# Patient Record
Sex: Male | Born: 1970 | Race: White | Hispanic: No | Smoking: Former smoker
Health system: Southern US, Community
[De-identification: ages and names within clinical notes are randomized; demographics above are authoritative.]

## PROBLEM LIST (undated history)

## (undated) DIAGNOSIS — E785 Hyperlipidemia, unspecified: Secondary | ICD-10-CM

## (undated) DIAGNOSIS — Z8042 Family history of malignant neoplasm of prostate: Secondary | ICD-10-CM

## (undated) DIAGNOSIS — Z803 Family history of malignant neoplasm of breast: Secondary | ICD-10-CM

## (undated) DIAGNOSIS — I080 Rheumatic disorders of both mitral and aortic valves: Secondary | ICD-10-CM

## (undated) DIAGNOSIS — I10 Essential (primary) hypertension: Secondary | ICD-10-CM

## (undated) DIAGNOSIS — I2510 Atherosclerotic heart disease of native coronary artery without angina pectoris: Secondary | ICD-10-CM

## (undated) DIAGNOSIS — Z720 Tobacco use: Secondary | ICD-10-CM

## (undated) DIAGNOSIS — I219 Acute myocardial infarction, unspecified: Secondary | ICD-10-CM

## (undated) HISTORY — DX: Tobacco use: Z72.0

## (undated) HISTORY — DX: Acute myocardial infarction, unspecified: I21.9

## (undated) HISTORY — DX: Essential (primary) hypertension: I10

## (undated) HISTORY — DX: Atherosclerotic heart disease of native coronary artery without angina pectoris: I25.10

## (undated) HISTORY — DX: Family history of malignant neoplasm of breast: Z80.3

## (undated) HISTORY — PX: CORONARY STENT PLACEMENT: SHX1402

## (undated) HISTORY — DX: Hyperlipidemia, unspecified: E78.5

## (undated) HISTORY — DX: Family history of malignant neoplasm of prostate: Z80.42

## (undated) HISTORY — DX: Rheumatic disorders of both mitral and aortic valves: I08.0

---

## 2001-11-11 HISTORY — PX: LITHOTRIPSY: SUR834

## 2006-11-10 ENCOUNTER — Emergency Department (HOSPITAL_COMMUNITY): Admission: EM | Admit: 2006-11-10 | Discharge: 2006-11-10 | Payer: Self-pay | Admitting: Family Medicine

## 2007-08-26 ENCOUNTER — Emergency Department (HOSPITAL_COMMUNITY): Admission: EM | Admit: 2007-08-26 | Discharge: 2007-08-26 | Payer: Self-pay | Admitting: Emergency Medicine

## 2007-09-23 ENCOUNTER — Ambulatory Visit: Payer: Self-pay | Admitting: Cardiology

## 2007-11-13 ENCOUNTER — Ambulatory Visit: Payer: Self-pay

## 2007-11-13 LAB — CONVERTED CEMR LAB
ALT: 43 units/L (ref 0–53)
AST: 30 units/L (ref 0–37)
Bilirubin, Direct: 0.1 mg/dL (ref 0.0–0.3)
HDL: 33.7 mg/dL — ABNORMAL LOW (ref >39.0)
LDL Cholesterol: 98 mg/dL (ref 0–99)
Total Bilirubin: 0.7 mg/dL (ref 0.3–1.2)

## 2007-11-18 ENCOUNTER — Ambulatory Visit: Payer: Self-pay | Admitting: Cardiology

## 2008-06-08 ENCOUNTER — Ambulatory Visit: Payer: Self-pay | Admitting: Cardiology

## 2008-06-15 ENCOUNTER — Ambulatory Visit: Payer: Self-pay | Admitting: Cardiology

## 2008-06-15 LAB — CONVERTED CEMR LAB
ALT: 35 units/L (ref 0–53)
AST: 25 units/L (ref 0–37)
Albumin: 4 g/dL (ref 3.5–5.2)
Alkaline Phosphatase: 65 units/L (ref 39–117)
Cholesterol: 132 mg/dL (ref 0–200)
LDL Cholesterol: 80 mg/dL (ref 0–99)
Triglycerides: 111 mg/dL (ref 0–149)

## 2009-02-11 ENCOUNTER — Emergency Department (HOSPITAL_COMMUNITY): Admission: EM | Admit: 2009-02-11 | Discharge: 2009-02-11 | Payer: Self-pay | Admitting: Family Medicine

## 2009-03-27 ENCOUNTER — Encounter (INDEPENDENT_AMBULATORY_CARE_PROVIDER_SITE_OTHER): Payer: Self-pay | Admitting: *Deleted

## 2009-11-28 DIAGNOSIS — I080 Rheumatic disorders of both mitral and aortic valves: Secondary | ICD-10-CM | POA: Insufficient documentation

## 2009-11-28 DIAGNOSIS — I2510 Atherosclerotic heart disease of native coronary artery without angina pectoris: Secondary | ICD-10-CM | POA: Insufficient documentation

## 2009-11-28 DIAGNOSIS — I340 Nonrheumatic mitral (valve) insufficiency: Secondary | ICD-10-CM | POA: Insufficient documentation

## 2009-11-28 DIAGNOSIS — I219 Acute myocardial infarction, unspecified: Secondary | ICD-10-CM | POA: Insufficient documentation

## 2009-11-28 DIAGNOSIS — I10 Essential (primary) hypertension: Secondary | ICD-10-CM | POA: Insufficient documentation

## 2009-11-28 DIAGNOSIS — E785 Hyperlipidemia, unspecified: Secondary | ICD-10-CM | POA: Insufficient documentation

## 2009-12-04 ENCOUNTER — Ambulatory Visit: Payer: Self-pay | Admitting: Cardiology

## 2009-12-04 DIAGNOSIS — R930 Abnormal findings on diagnostic imaging of skull and head, not elsewhere classified: Secondary | ICD-10-CM | POA: Insufficient documentation

## 2009-12-04 DIAGNOSIS — F17200 Nicotine dependence, unspecified, uncomplicated: Secondary | ICD-10-CM | POA: Insufficient documentation

## 2009-12-05 ENCOUNTER — Encounter: Payer: Self-pay | Admitting: Cardiology

## 2009-12-26 ENCOUNTER — Telehealth (INDEPENDENT_AMBULATORY_CARE_PROVIDER_SITE_OTHER): Payer: Self-pay | Admitting: *Deleted

## 2009-12-26 ENCOUNTER — Ambulatory Visit: Payer: Self-pay | Admitting: Cardiology

## 2009-12-26 LAB — CONVERTED CEMR LAB
CO2: 31 meq/L (ref 19–32)
Calcium: 9.5 mg/dL (ref 8.4–10.5)
Sodium: 139 meq/L (ref 135–145)

## 2010-12-11 NOTE — Progress Notes (Signed)
  Walk in Patient Form Recieved " Pt. has questions about Medicine"forwarded to Jonelle [facility] Mesiemore  December 26, 2009 3:40 PM

## 2010-12-11 NOTE — Medication Information (Signed)
Summary: [facility] Prescription Information  [facility] Prescription Information   Imported By: Roderic Ovens 12/08/2009 11:12:34  _____________________________________________________________________  External Attachment:    Type:   Image     Comment:   External Document

## 2010-12-11 NOTE — Assessment & Plan Note (Signed)
Summary: f1y/kfw  Medications Added CRESTOR 40 MG TABS (ROSUVASTATIN CALCIUM) Take 1 tablet by mouth once a day NIASPAN 500 MG CR-TABS (NIACIN (ANTIHYPERLIPIDEMIC)) Take 1 tablet by mouth at bedtime ASPIRIN 81 MG TBEC (ASPIRIN) Take one tablet by mouth daily METOPROLOL SUCCINATE 50 MG XR24H-TAB (METOPROLOL SUCCINATE) Take one tablet by mouth daily FISH OIL   OIL (FISH OIL) 1 tab by mouth once daily LISINOPRIL 10 MG TABS (LISINOPRIL) Take one tablet by mouth daily        History of Present Illness: The patient is a pleasant gentleman who I have seen in the past for coronary artery disease.  The patient  had an inferior myocardial infarction in 1999 at age 36.  He had a stent to the right coronary artery at that time.  His most recent Myoview here was performed on November 13, 2007.  His LV function was normal with an estimated ejection fraction of 58%.  There was no ischemia or infarction.  His echocardiogram on November 13, 2007, showed normal LV function, mild prolapse of the anterior mitral valve leaflet and mild mitral regurgitation. I last saw him in July of 2009. Since then the patient denies any dyspnea on exertion, orthopnea, PND, pedal edema, palpitations, syncope or chest pain.   Current Medications (verified): 1)  Crestor 40 Mg Tabs (Rosuvastatin Calcium) .... Take 1 Tablet By Mouth Once A Day 2)  Niaspan 500 Mg Cr-Tabs (Niacin (Antihyperlipidemic)) .... Take 1 Tablet By Mouth At Bedtime 3)  Aspirin 81 Mg Tbec (Aspirin) .... Take One Tablet By Mouth Daily 4)  Metoprolol Succinate 50 Mg Xr24h-Tab (Metoprolol Succinate) .... Take One Tablet By Mouth Daily 5)  Fish Oil   Oil (Fish Oil) .Marland Kitchen.. 1 Tab By Mouth Once Daily  Past History:  Past Medical History: Current Problems:  MITRAL REGURGITATION (ICD-396.3) mitral valve prolapse HYPERLIPIDEMIA (ICD-272.4) MYOCARDIAL INFARCTION (ICD-410.90) HYPERTENSION (ICD-401.9) CAD (ICD-414.00)  Social History: Reviewed history from  11/28/2009 and no changes required.  no drug abuse, former smoker, occasional drinker   Review of Systems       no fevers or chills, productive cough, hemoptysis, dysphasia, odynophagia, melena, hematochezia, dysuria, hematuria, rash, seizure activity, orthopnea, PND, pedal edema, claudication. Remaining systems are negative.   Vital Signs:  Patient profile:   40 year old male Height:      68 inches Weight:      157 pounds BMI:     23.96 Pulse rate:   55 / minute Resp:     12 per minute BP sitting:   142 / 76  (left arm)  Vitals Entered By: Kem Parkinson (December 04, 2009 2:25 PM)  Physical Exam  General:  Well-developed well-nourished in no acute distress.  Skin is warm and dry.  HEENT is normal.  Neck is supple. No thyromegaly.  Chest is clear to auscultation with normal expansion.  Cardiovascular exam is regular rate and rhythm. 2/6 systolic murmur at the apex Abdominal exam nontender or distended. No masses palpated. Extremities show no edema. neuro grossly intact    EKG  Procedure date:  12/04/2009  Findings:      Sinus rhythm at a rate of 55. No ST changes noted.  Impression & Recommendations:  Problem # 1:  CAD (ICD-414.00)  Continue aspirin, beta blocker and statin. Repeat stress test in one year when he returns. His updated medication list for this problem includes:    Aspirin 81 Mg Tbec (Aspirin) .Marland Kitchen... Take one tablet by mouth daily    Metoprolol Succinate  50 Mg Xr24h-tab (Metoprolol succinate) .Marland Kitchen... Take one tablet by mouth daily    Lisinopril 10 Mg Tabs (Lisinopril) .Marland Kitchen... Take one tablet by mouth daily  Orders: T-2 View CXR (71020TC)  Problem # 2:  HYPERTENSION (ICD-401.9) Blood pressure elevated. Add lisinopril 10 mg p.o. daily. Check be met in one week. His updated medication list for this problem includes:    Aspirin 81 Mg Tbec (Aspirin) .Marland Kitchen... Take one tablet by mouth daily    Metoprolol Succinate 50 Mg Xr24h-tab (Metoprolol succinate) .Marland Kitchen...  Take one tablet by mouth daily    Lisinopril 10 Mg Tabs (Lisinopril) .Marland Kitchen... Take one tablet by mouth daily  Problem # 3:  HYPERLIPIDEMIA (ICD-272.4) Resume Crestor and Niaspan. Patient states he was not taking these for the past 2 months. Check lipids and liver in 6 weeks. His updated medication list for this problem includes:    Crestor 40 Mg Tabs (Rosuvastatin calcium) .Marland Kitchen... Take 1 tablet by mouth once a day    Niaspan 500 Mg Cr-tabs (Niacin (antihyperlipidemic)) .Marland Kitchen... Take 1 tablet by mouth at bedtime  Problem # 4:  MITRAL REGURGITATION (ICD-396.3) Previous echocardiogram showed mitral valve prolapse and mild mitral regurgitation. He will need followup echocardiograms in the future.  Problem # 5:  TOBACCO ABUSE (ICD-305.1) Patient counseled on discontinuing for between 3-10 minutes. Check chest x-ray.  Patient Instructions: 1)  Your physician recommends that you schedule a follow-up appointment in: ONE YEAR 2)  Your physician recommends that you return for lab work in:ONE WEEK-NONFASTING 3)  6 WEEKS FASTING 4)  Your physician has recommended you make the following change in your medication: RESTART CRESTOR 40MG  ONCE DAILY 5)  RESTART NIASPAN 500MG  ONCE DAILY 6)  START LISINOPRIL 10MG  ONCE DAILY Prescriptions: LISINOPRIL 10 MG TABS (LISINOPRIL) Take one tablet by mouth daily  #90 x 4   Entered by:   Deliah Goody, RN   Authorized by:   Ferman Hamming, MD, [facility]   Signed by:   Deliah Goody, RN on 12/04/2009   Method used:   Electronically to        [facility]* (mail-order)             ,          Ph: [phone]       Fax: [phone]   RxID:   [id number]

## 2010-12-11 NOTE — Progress Notes (Signed)
  Phone Note Outgoing Call   Call placed by: Deliah Goody, RN,  December 26, 2009 5:07 PM Summary of Call: pt called with lab, he c/o headache since starting lisinopril. he has never really had headaches before and is having them daily since starting lisinopril and they seem to be getting worse. okay given for pt to stop lisinopril and will foward for dr Jens Som review Deliah Goody, RN  December 26, 2009 5:12 PM   Follow-up for Phone Call        dc lisinopril; norvasc 5 mg by mouth daily Ferman Hamming, MD, [facility]  December 28, 2009 8:14 AM   Additional Follow-up for Phone Call Additional follow up Details #1::        PT AWARE NEW MED SENT TO [facility] Additional Follow-up by: Scherrie Bateman, LPN,  December 28, 2009 3:40 PM    New/Updated Medications: AMLODIPINE BESYLATE 5 MG TABS (AMLODIPINE BESYLATE) 1 once daily Prescriptions: AMLODIPINE BESYLATE 5 MG TABS (AMLODIPINE BESYLATE) 1 once daily  #90 x 4   Entered by:   Scherrie Bateman, LPN   Authorized by:   Ferman Hamming, MD, [facility]   Signed by:   Scherrie Bateman, LPN on 40/34/7425   Method used:   Electronically to        [facility]* (mail-order)             ,          Ph: [phone]       Fax: [phone]   RxID:   [id number]

## 2011-03-07 ENCOUNTER — Other Ambulatory Visit: Payer: Self-pay | Admitting: Cardiology

## 2011-03-26 NOTE — Assessment & Plan Note (Signed)
[facility]                            CARDIOLOGY OFFICE NOTE   Austin Vincent, Austin Vincent                  MRN:          [id number]  DATE:09/23/2007                            DOB:          1971-01-25    Austin Vincent is a 40 year old with past medical history of coronary  artery disease who presents to establish.  The patient's cardiac history  dates back to 1999 when he suffered a myocardial infarction at age 71.  This occurred in [place].  He apparently had PCI of his right  coronary artery at that time.  I do not have those records available.  Since then he has been followed in [place] and in [place].  Over  the past 6 months, he has been off of all of his medications.  He wants  to establish here at [facility].  Of note, approximately 1 month  ago he was seen at [facility] for chest pain.  However, it was  felt that he had a pneumonia.  He has not had any chest pain in the past  1 month and does not have any when he exerts himself.  There is no  dyspnea on exertion, orthopnea, PND, pedal edema, palpitations,  presyncope, or syncope.   CURRENT MEDICATIONS:  None, as he has discontinued his medications.   SOCIAL HISTORY:  He does smoke.  He occasionally consumes alcohol.  He  denies any drug use.   FAMILY HISTORY:  Positive for coronary artery disease in both his mother  and his father.   PAST MEDICAL HISTORY:  1. Hypertension.  2. Hyperlipidemia.  3. There is no diabetes mellitus.  4. He has a history of myocardial infarction, as described above.   PAST SURGICAL HISTORY:  He has had no previous surgeries.   REVIEW OF SYSTEMS:  He denies any headaches or fevers or chills.  There  is no productive cough or hemoptysis.  There is no dysphagia,  odynophagia, melena, or hematochezia.  There is no dysuria or hematuria.  There is no rash or seizure activity.  There is no orthopnea, PND, or  pedal edema.  There is no  claudication.  The remainder of the review of  systems are negative.   PHYSICAL EXAMINATION:  VITAL SIGNS:  His exam today shows a blood  pressure of 153/109, pulse 84.  He weighs 141 pounds.  GENERAL:  He is well-developed and well-nourished, in no acute distress.  SKIN:  Warm and dry.  PSYCHIATRIC:  He does not appear to be depressed.  BACK:  Normal.  HEENT:  Normal with normal eyelids.  NECK:  Supple with normal upstroke bilaterally.  No bruits.  There is no  jugular venous distension.  I cannot appreciate thyromegaly.  CHEST:  Clear to auscultation with normal expansion.  CARDIOVASCULAR:  Regular rate and rhythm with normal S1 and S2.  There  is a 2/6 systolic murmur at the apex that radiates to the left axilla.  ABDOMEN:  Nontender, nondistended.  Positive bowel sounds.  No  hepatosplenomegaly.  No masses appreciated.  There is no abdominal  bruit.  EXTREMITIES:  There is no peripheral clubbing.  He has 2+ femoral pulses  bilateral and no bruits.  No edema.  I palpate no cords.  He has 2+  posterior tibial pulses bilaterally.  NEUROLOGIC:  Grossly intact.   His electrocardiogram shows a sinus rhythm at a rate of 80.  A prior  inferior infarct cannot be excluded.   DIAGNOSES:  1. Coronary artery disease.  Austin Vincent has a history of coronary      disease.  He also had chest pain approximately 1 month ago that      apparently was felt to be related to pneumonia.  However, we will      risk stratify with a stress Myoview.  Note, he also has a history      of ischemic mitral regurgitation, and there was a murmur on      examination.  I will schedule him to have an echocardiogram to      evaluate his mitral regurgitation.  He is off of all of his      medications, and we will resume aspirin, Toprol-XL at 50 mg orally      daily, and Crestor 40 mg orally daily (he was on this dose      previously).  We discussed the importance of discontinuing his      tobacco use as well as  risk factor modification, including diet and      exercise.  2. Chest pain.  As per #1, we will schedule a Myoview.  3. Hypertension.  His blood pressure is elevated today, and we will      begin Toprol-XL.  If it is not controlled with Toprol-XL alone, we      will add angiotensin-converting enzyme inhibitor.  4. Hyperlipidemia.  We will resume his Crestor at 40 mg orally daily,      and will check lipids and liver in 6 weeks and adjust as indicated.  5. Tobacco use.  We discussed the importance of discontinuing this.   I will see him back in 6-8 weeks to check his blood pressure and adjust  his regimen as indicated.     Madolyn Frieze Austin Som, MD, [facility]  Electronically Signed    BSC/MedQ  DD: 09/23/2007  DT: 09/24/2007  Job #: [id number]

## 2011-03-26 NOTE — Assessment & Plan Note (Signed)
[facility]                            CARDIOLOGY OFFICE NOTE   Austin Vincent, Austin Vincent                  MRN:          [id number]  DATE:06/08/2008                            DOB:          [birth year]    HISTORY OF PRESENT ILLNESS:  The patient is a pleasant 40 year old  gentleman who I have seen in the past for coronary artery disease.  The  patient apparently had an inferior myocardial infarction in 1999 at age  16.  He had a stent to the right coronary artery at that time.  His most  recent Myoview here was performed on November 13, 2007.  His LV function  was normal with an estimated ejection fraction of 58%.  There was no  ischemia or infarction.  His echocardiogram on November 13, 2007, showed  normal LV function, mild prolapse of the anterior mitral valve leaflet  and mild mitral regurgitation.  Since I last saw him, he denies any  dyspnea, chest pain, palpitations, or syncope.  There is no pedal edema.  He states he occasionally smokes a cigarette.  He is not exercising  routinely.  He is trying to follow a diet.   MEDICATIONS:  1. Toprol 25 mg p.o. daily.  2. Crestor 40 mg p.o. daily.  3. Aspirin 81 mg p.o. daily.  4. Niaspan 500 mg p.o. daily   PHYSICAL EXAMINATION:  VITAL SIGNS:  Blood pressure of 153/99, his pulse  is 68, he weighs 172 pounds.  HEENT:  Normal.  NECK:  Supple.  CHEST:  Clear.  CARDIOVASCULAR:  Regular rate and rhythm.  ABDOMEN:  No tenderness.  EXTREMITIES:  No edema.   His electrocardiogram shows a sinus rhythm at a rate of 77.  The axis is  normal.  A prior inferior infarct cannot be excluded.   DIAGNOSES:  1. Coronary artery disease status post percutaneous coronary      intervention of the right coronary artery - Austin Vincent is      doing well from symptomatic standpoint and his Myoview performed in      January of this year showed no ischemia.  We will continue his      medical therapy including his aspirin,  statin, and beta blocker.  2. Hypertension - his blood pressure is elevated.  I will increase his      Toprol to 50 mg p.o. daily.  3. Hyperlipidemia - he will continue on his Crestor and his Niaspan      and we will check lipids and liver and adjust as indicated.  4. Tobacco abuse - we discussed importance of discontinuing this      between 3-10 minutes.  5. History of mitral regurgitation (mild).   We discussed the importance of diet and exercise.  He will see [place] back  in 12 months.     Austin Vincent Austin Som, MD, [facility]  Electronically Signed    BSC/MedQ  DD: 06/08/2008  DT: 06/09/2008  Job #: [id number]

## 2011-03-26 NOTE — Assessment & Plan Note (Signed)
[facility]                            CARDIOLOGY OFFICE NOTE   Austin Vincent, Austin Vincent                  MRN:          [id number]  DATE:11/18/2007                            DOB:          December 01, 1970    HISTORY:  Austin Vincent is a very pleasant 40 year old gentleman who  has a history of coronary disease I initially met on September 23, 2007.  At that time, he was off all of his medications.  We resumed his Toprol,  aspirin and Crestor.  We did schedule him to have an echocardiogram on  November 13, 2007.  His LV function was normal.  There was mild prolapse  of the anterior mitral valve leaflet with mild mitral regurgitation.  He  also had a Myoview on November 13, 2007.  The ejection fraction 68% and  there was no ischemia or infarction.  Since I saw him, he denies any  dyspnea on exertion, orthopnea, PND, pedal edema, presyncope, syncope,  exertional chest pain.   MEDICATIONS:  1. Toprol 25 mg p.o. daily.  2. Crestor 40 mg p.o. daily.  3. Aspirin 81 mg p.o. daily.   PHYSICAL EXAMINATION:  VITAL SIGNS:  Blood pressure 110/80, pulse of 68,  he weighs 174 pounds.  HEENT:  Normal.  NECK:  Supple.  CHEST:  Clear.  CARDIOVASCULAR:  Regular rate.  ABDOMEN:  No tenderness.  EXTREMITIES:  Show no edema.   DIAGNOSES:  1. Coronary artery disease.  Austin Vincent has not complained of any      chest pain or shortness of breath since I last saw him.  His      Myoview showed no ischemia or infarction.  We will continue medical      therapy including his aspirin, statin and beta blocker.  2. History of mitral regurgitation.  His follow up echocardiogram      showed mild mitral valve prolapse and mild mitral regurgitation.      We will follow this in the future.  3. Hypertension.  Blood pressure is adequately controlled on his      Toprol.  4. Hyperlipidemia.  We did resume his Crestor at 40 mg p.o. daily.      His LDL now is 98 with HDL 33.  We will  add Niaspan 500 mg p.o.      daily x 4 weeks and then increase to 1 gram p.o. daily.  We will      then check CK as well as liver functions and lipids 4 weeks later.  5. Tobacco abuse.  He is now discontinued this for the past 4 weeks.   PLAN:  1. We discussed the importance of diet and exercise.  2. We will see him back in approximately 6 months.     Madolyn Frieze Jens Som, MD, [facility]  Electronically Signed    BSC/MedQ  DD: 11/18/2007  DT: 11/18/2007  Job #: [id number]

## 2011-08-21 LAB — DIFFERENTIAL
Eosinophils Absolute: 0
Lymphs Abs: 0.9
Monocytes Relative: 11
Neutro Abs: 2.1
Neutrophils Relative %: 63

## 2011-08-21 LAB — CBC
Hemoglobin: 14.7
MCHC: 35.5
Platelets: 161
RDW: 12.6

## 2011-08-21 LAB — COMPREHENSIVE METABOLIC PANEL
ALT: 44
Calcium: 9.3
Glucose, Bld: 100 — ABNORMAL HIGH
Sodium: 136
Total Protein: 7.1

## 2011-08-21 LAB — POCT CARDIAC MARKERS
CKMB, poc: <1 — ABNORMAL LOW
Myoglobin, poc: 71
Operator id: 146091
Operator id: 279831
Troponin i, poc: <0.05

## 2011-08-21 LAB — URINALYSIS, ROUTINE W REFLEX MICROSCOPIC
Glucose, UA: NEGATIVE
Specific Gravity, Urine: 1.014
pH: 6.5

## 2011-08-21 LAB — CULTURE, BLOOD (ROUTINE X 2): Culture: NO GROWTH

## 2011-09-20 ENCOUNTER — Encounter: Payer: Self-pay | Admitting: *Deleted

## 2011-09-23 ENCOUNTER — Encounter: Payer: Self-pay | Admitting: Cardiology

## 2011-09-23 ENCOUNTER — Ambulatory Visit (INDEPENDENT_AMBULATORY_CARE_PROVIDER_SITE_OTHER): Payer: 59 | Admitting: Cardiology

## 2011-09-23 DIAGNOSIS — I340 Nonrheumatic mitral (valve) insufficiency: Secondary | ICD-10-CM

## 2011-09-23 DIAGNOSIS — I059 Rheumatic mitral valve disease, unspecified: Secondary | ICD-10-CM

## 2011-09-23 DIAGNOSIS — I341 Nonrheumatic mitral (valve) prolapse: Secondary | ICD-10-CM

## 2011-09-23 DIAGNOSIS — I2510 Atherosclerotic heart disease of native coronary artery without angina pectoris: Secondary | ICD-10-CM

## 2011-09-23 DIAGNOSIS — F17200 Nicotine dependence, unspecified, uncomplicated: Secondary | ICD-10-CM

## 2011-09-23 NOTE — Progress Notes (Signed)
HPI:The patient is a pleasant gentleman who I have seen in the past for coronary artery disease.  The patient  had an inferior myocardial infarction in 1999 at age 40.  He had a stent to the right coronary artery at that time.  His most recent Myoview here was performed on November 13, 2007.  His LV function was normal with an estimated ejection fraction of 58%.  There was no ischemia or infarction.  His echocardiogram on November 13, 2007, showed normal LV function, mild prolapse of the anterior mitral valve leaflet and mild mitral regurgitation. I last saw him in Jan 2011. Since then the patient denies any dyspnea on exertion, orthopnea, PND, pedal edema, palpitations, syncope or chest pain.   Current Outpatient Prescriptions  Medication Sig Dispense Refill  . amLODipine (NORVASC) 5 MG tablet TAKE 1 TABLET ONCE DAILY  90 tablet  3  . aspirin 81 MG tablet Take 81 mg by mouth daily.        . CRESTOR 40 MG tablet TAKE 1 TABLET DAILY  90 tablet  3  . niacin (NIASPAN) 500 MG CR tablet Take 500 mg by mouth at bedtime.           Past Medical History  Diagnosis Date  . MYOCARDIAL INFARCTION   . MITRAL REGURGITATION   . HYPERTENSION   . HYPERLIPIDEMIA   . CAD     Past Surgical History  Procedure Date  . Coronary stent placement     History   Social History  . Marital Status: Divorced    Spouse Name: N/A    Number of Children: N/A  . Years of Education: N/A   Occupational History  . Not on file.   Social History Main Topics  . Smoking status: Current Everyday Smoker  . Smokeless tobacco: Not on file  . Alcohol Use: Not on file  . Drug Use: Not on file  . Sexually Active: Not on file   Other Topics Concern  . Not on file   Social History Narrative  . No narrative on file    ROS: Complains of ED and back pain but no fevers or chills, productive cough, hemoptysis, dysphasia, odynophagia, melena, hematochezia, dysuria, hematuria, rash, seizure activity, orthopnea, PND, pedal edema,  claudication. Remaining systems are negative.  Physical Exam: Well-developed well-nourished in no acute distress.  Skin is warm and dry.  HEENT is normal.  Neck is supple. No thyromegaly.  Chest is clear to auscultation with normal expansion.  Cardiovascular exam is regular rate and rhythm. 2/6 systolic murmur apex Abdominal exam nontender or distended. No masses palpated. Extremities show no edema. neuro grossly intact  ECG NSR with nonspecific ST changes.

## 2011-09-23 NOTE — Assessment & Plan Note (Signed)
Continue statin. Check lipids and liver. 

## 2011-09-23 NOTE — Assessment & Plan Note (Signed)
Blood pressure controlled. Continue present medications. 

## 2011-09-23 NOTE — Assessment & Plan Note (Signed)
Repeat echocardiogram for mitral regurgitation. 

## 2011-09-23 NOTE — Assessment & Plan Note (Signed)
Continue present medications. Schedule stress echocardiogram.

## 2011-09-23 NOTE — Patient Instructions (Signed)
Your physician wants you to follow-up in: ONE YEAR You will receive a reminder letter in the mail two months in advance. If you don't receive a letter, please call our office to schedule the follow-up appointment.   Your physician has requested that you have a stress echocardiogram. For further information please visit [email or web address] Please follow instruction sheet as given.   Your physician has requested that you have an echocardiogram. Echocardiography is a painless test that uses sound waves to create images of your heart. It provides your doctor with information about the size and shape of your heart and how well your heart's chambers and valves are working. This procedure takes approximately one hour. There are no restrictions for this procedure.   Your physician recommends that you return for a FASTING lipid profile: WITH STRESS TEST

## 2011-09-23 NOTE — Assessment & Plan Note (Signed)
Patient counseled on discontinuing. 

## 2011-11-08 ENCOUNTER — Ambulatory Visit (HOSPITAL_COMMUNITY): Payer: 59 | Attending: Internal Medicine | Admitting: Radiology

## 2011-11-08 ENCOUNTER — Other Ambulatory Visit (HOSPITAL_COMMUNITY): Payer: 59 | Admitting: Radiology

## 2011-11-08 ENCOUNTER — Ambulatory Visit (HOSPITAL_BASED_OUTPATIENT_CLINIC_OR_DEPARTMENT_OTHER): Payer: 59 | Admitting: Radiology

## 2011-11-08 ENCOUNTER — Other Ambulatory Visit (INDEPENDENT_AMBULATORY_CARE_PROVIDER_SITE_OTHER): Payer: 59 | Admitting: *Deleted

## 2011-11-08 DIAGNOSIS — I059 Rheumatic mitral valve disease, unspecified: Secondary | ICD-10-CM

## 2011-11-08 DIAGNOSIS — R0989 Other specified symptoms and signs involving the circulatory and respiratory systems: Secondary | ICD-10-CM

## 2011-11-08 DIAGNOSIS — E785 Hyperlipidemia, unspecified: Secondary | ICD-10-CM | POA: Insufficient documentation

## 2011-11-08 DIAGNOSIS — I252 Old myocardial infarction: Secondary | ICD-10-CM | POA: Insufficient documentation

## 2011-11-08 DIAGNOSIS — I2510 Atherosclerotic heart disease of native coronary artery without angina pectoris: Secondary | ICD-10-CM | POA: Insufficient documentation

## 2011-11-08 DIAGNOSIS — I10 Essential (primary) hypertension: Secondary | ICD-10-CM | POA: Insufficient documentation

## 2011-11-08 DIAGNOSIS — I341 Nonrheumatic mitral (valve) prolapse: Secondary | ICD-10-CM

## 2011-11-08 DIAGNOSIS — I340 Nonrheumatic mitral (valve) insufficiency: Secondary | ICD-10-CM

## 2011-11-08 DIAGNOSIS — F17200 Nicotine dependence, unspecified, uncomplicated: Secondary | ICD-10-CM | POA: Insufficient documentation

## 2011-11-08 LAB — LIPID PANEL
Cholesterol: 182 mg/dL (ref 0–200)
HDL: 46.4 mg/dL (ref >39.00)
VLDL: 20.8 mg/dL (ref 0.0–40.0)

## 2011-11-08 LAB — HEPATIC FUNCTION PANEL: Albumin: 4.2 g/dL (ref 3.5–5.2)

## 2011-11-11 ENCOUNTER — Telehealth: Payer: Self-pay | Admitting: Cardiology

## 2011-11-11 NOTE — Telephone Encounter (Signed)
Fu call °Pt returning your call  °

## 2011-11-11 NOTE — Telephone Encounter (Signed)
Spoke with pt, aware of labs, echo and stress echo results

## 2012-06-27 ENCOUNTER — Other Ambulatory Visit: Payer: Self-pay | Admitting: Cardiology

## 2013-02-10 ENCOUNTER — Telehealth: Payer: Self-pay | Admitting: Cardiology

## 2013-02-10 NOTE — Telephone Encounter (Signed)
New Prob   Pt requesting blood work (cholestorol and muscle enzymes). Would like to speak to nurse.

## 2013-02-10 NOTE — Telephone Encounter (Signed)
Spoke with pt, he has been having trouble with muscle aches and inflammation. He would like to have labs checked at appt tomorrow. Pt will come fasting to the appt.

## 2013-02-11 ENCOUNTER — Other Ambulatory Visit: Payer: Self-pay | Admitting: *Deleted

## 2013-02-11 ENCOUNTER — Encounter: Payer: Self-pay | Admitting: Cardiology

## 2013-02-11 ENCOUNTER — Ambulatory Visit (INDEPENDENT_AMBULATORY_CARE_PROVIDER_SITE_OTHER): Payer: 59 | Admitting: Cardiology

## 2013-02-11 VITALS — BP 140/90 | HR 76 | Ht 68.0 in | Wt 148.1 lb

## 2013-02-11 DIAGNOSIS — F17200 Nicotine dependence, unspecified, uncomplicated: Secondary | ICD-10-CM

## 2013-02-11 DIAGNOSIS — I2510 Atherosclerotic heart disease of native coronary artery without angina pectoris: Secondary | ICD-10-CM

## 2013-02-11 DIAGNOSIS — E7800 Pure hypercholesterolemia, unspecified: Secondary | ICD-10-CM

## 2013-02-11 LAB — HEPATIC FUNCTION PANEL
Bilirubin, Direct: 0.1 mg/dL (ref 0.0–0.3)
Total Bilirubin: 0.5 mg/dL (ref 0.3–1.2)
Total Protein: 7.5 g/dL (ref 6.0–8.3)

## 2013-02-11 LAB — LIPID PANEL
Cholesterol: 178 mg/dL (ref 0–200)
HDL: 35 mg/dL — ABNORMAL LOW (ref >39.00)
LDL Cholesterol: 113 mg/dL — ABNORMAL HIGH (ref 0–99)
VLDL: 30.4 mg/dL (ref 0.0–40.0)

## 2013-02-11 NOTE — Assessment & Plan Note (Signed)
Patient counseled on discontinuing. 

## 2013-02-11 NOTE — Assessment & Plan Note (Signed)
Patient has had some back discomfort that he feels is related to Crestor. We will discontinue that medication. Check CK. If in 6 weeks his symptoms have improved we will consider trying a different statin if he is agreeable. We will check lipids and liver at that time as well.

## 2013-02-11 NOTE — Assessment & Plan Note (Signed)
Continue aspirin 

## 2013-02-11 NOTE — Progress Notes (Signed)
   HPI: The patient is a pleasant gentleman who I have seen in the past for coronary artery disease. The patient had an inferior myocardial infarction in 1999 at age 42. He had a stent to the right coronary artery at that time. Last stress echocardiogram performed in December of 2012 was normal. Echocardiogram in December of 2012 showed normal LV function. There was mild prolapse of the anterior mitral valve leaflet and mild mitral regurgitation. I last saw him in Nov 2012. Since then the patient denies any dyspnea on exertion, orthopnea, PND, pedal edema, palpitations, syncope or chest pain. He has had some significant back discomfort that he attributes to Crestor. He missed his Crestor for 2 weeks and his symptoms improved.   Current Outpatient Prescriptions  Medication Sig Dispense Refill  . amLODipine (NORVASC) 5 MG tablet TAKE 1 TABLET ONCE DAILY  90 tablet  2  . aspirin 81 MG tablet Take 81 mg by mouth daily.        . Coenzyme Q10 (CO Q-10 PO) Take by mouth daily.      . CRESTOR 40 MG tablet TAKE 1 TABLET DAILY  90 tablet  2  . niacin (NIASPAN) 500 MG CR tablet Take 500 mg by mouth at bedtime.         No current facility-administered medications for this visit.     Past Medical History  Diagnosis Date  . MYOCARDIAL INFARCTION   . MITRAL REGURGITATION   . HYPERTENSION   . HYPERLIPIDEMIA   . CAD     Past Surgical History  Procedure Laterality Date  . Coronary stent placement      History   Social History  . Marital Status: Divorced    Spouse Name: N/A    Number of Children: N/A  . Years of Education: N/A   Occupational History  . Not on file.   Social History Main Topics  . Smoking status: Current Every Day Smoker  . Smokeless tobacco: Not on file  . Alcohol Use: Not on file  . Drug Use: Not on file  . Sexually Active: Not on file   Other Topics Concern  . Not on file   Social History Narrative  . No narrative on file    ROS: back pain but no fevers or  chills, productive cough, hemoptysis, dysphasia, odynophagia, melena, hematochezia, dysuria, hematuria, rash, seizure activity, orthopnea, PND, pedal edema, claudication. Remaining systems are negative.  Physical Exam: Well-developed well-nourished in no acute distress.  Skin is warm and dry.  HEENT is normal.  Neck is supple.  Chest is clear to auscultation with normal expansion.  Cardiovascular exam is regular rate and rhythm. 2/6 systolic murmur apex. Abdominal exam nontender or distended. No masses palpated. Extremities show no edema. neuro grossly intact  ECG sinus rhythm at a rate of 76. Left ventricular hypertrophy. No ST changes.

## 2013-02-11 NOTE — Assessment & Plan Note (Signed)
Mitral regurgitation is mild on most recent echo. 

## 2013-02-11 NOTE — Patient Instructions (Addendum)
Your physician wants you to follow-up in: ONE YEAR WITH DR Shelda Pal will receive a reminder letter in the mail two months in advance. If you don't receive a letter, please call our office to schedule the follow-up appointment.   STOP CRESTOR  Your physician recommends that you HAVE LAB WORK TODAY AND IN 6 WEEKS=DO NOT EAT PRIOR TO LAB WORK IN 6 WEEKS=WEEK OF MAY 12TH

## 2013-02-11 NOTE — Assessment & Plan Note (Signed)
Continue present medications. 

## 2013-03-24 ENCOUNTER — Other Ambulatory Visit: Payer: Self-pay | Admitting: *Deleted

## 2013-03-24 ENCOUNTER — Other Ambulatory Visit (INDEPENDENT_AMBULATORY_CARE_PROVIDER_SITE_OTHER): Payer: 59

## 2013-03-24 DIAGNOSIS — E7800 Pure hypercholesterolemia, unspecified: Secondary | ICD-10-CM

## 2013-03-24 LAB — HEPATIC FUNCTION PANEL
ALT: 24 U/L (ref 0–53)
Albumin: 3.9 g/dL (ref 3.5–5.2)
Total Bilirubin: 0.5 mg/dL (ref 0.3–1.2)
Total Protein: 6.7 g/dL (ref 6.0–8.3)

## 2013-03-24 LAB — LIPID PANEL
Cholesterol: 241 mg/dL — ABNORMAL HIGH (ref 0–200)
HDL: 35.1 mg/dL — ABNORMAL LOW (ref >39.00)
Triglycerides: 119 mg/dL (ref 0.0–149.0)

## 2013-03-24 LAB — LDL CHOLESTEROL, DIRECT: Direct LDL: 190.7 mg/dL

## 2013-03-24 MED ORDER — ATORVASTATIN CALCIUM 40 MG PO TABS: 40.0000 mg | ORAL_TABLET | Freq: Every day | ORAL | Status: DC

## 2013-09-15 ENCOUNTER — Other Ambulatory Visit: Payer: Self-pay

## 2013-09-15 MED ORDER — AMLODIPINE BESYLATE 5 MG PO TABS: ORAL_TABLET | ORAL | Status: DC

## 2013-12-01 ENCOUNTER — Encounter (HOSPITAL_COMMUNITY): Payer: Self-pay | Admitting: Emergency Medicine

## 2013-12-01 ENCOUNTER — Inpatient Hospital Stay (HOSPITAL_COMMUNITY)
Admission: EM | Admit: 2013-12-01 | Discharge: 2013-12-03 | DRG: 247 | Disposition: A | Discharge status: Home / Self Care | Payer: 59 | Attending: Cardiology | Admitting: Cardiology

## 2013-12-01 ENCOUNTER — Encounter (HOSPITAL_COMMUNITY)
Admission: EM | Disposition: A | Discharge status: Home / Self Care | Payer: 59 | Source: Home / Self Care | Attending: Cardiology

## 2013-12-01 ENCOUNTER — Ambulatory Visit (HOSPITAL_COMMUNITY): Admit: 2013-12-01 | Payer: 59 | Admitting: Cardiology

## 2013-12-01 DIAGNOSIS — Z8249 Family history of ischemic heart disease and other diseases of the circulatory system: Secondary | ICD-10-CM

## 2013-12-01 DIAGNOSIS — I214 Non-ST elevation (NSTEMI) myocardial infarction: Principal | ICD-10-CM

## 2013-12-01 DIAGNOSIS — I2582 Chronic total occlusion of coronary artery: Secondary | ICD-10-CM | POA: Diagnosis present

## 2013-12-01 DIAGNOSIS — I059 Rheumatic mitral valve disease, unspecified: Secondary | ICD-10-CM | POA: Diagnosis present

## 2013-12-01 DIAGNOSIS — I2510 Atherosclerotic heart disease of native coronary artery without angina pectoris: Secondary | ICD-10-CM

## 2013-12-01 DIAGNOSIS — I10 Essential (primary) hypertension: Secondary | ICD-10-CM

## 2013-12-01 DIAGNOSIS — I252 Old myocardial infarction: Secondary | ICD-10-CM | POA: Diagnosis present

## 2013-12-01 DIAGNOSIS — Z79899 Other long term (current) drug therapy: Secondary | ICD-10-CM

## 2013-12-01 DIAGNOSIS — Z9861 Coronary angioplasty status: Secondary | ICD-10-CM

## 2013-12-01 DIAGNOSIS — I959 Hypotension, unspecified: Secondary | ICD-10-CM | POA: Diagnosis present

## 2013-12-01 DIAGNOSIS — Z7982 Long term (current) use of aspirin: Secondary | ICD-10-CM

## 2013-12-01 DIAGNOSIS — E785 Hyperlipidemia, unspecified: Secondary | ICD-10-CM

## 2013-12-01 DIAGNOSIS — I2119 ST elevation (STEMI) myocardial infarction involving other coronary artery of inferior wall: Secondary | ICD-10-CM

## 2013-12-01 DIAGNOSIS — I219 Acute myocardial infarction, unspecified: Secondary | ICD-10-CM

## 2013-12-01 DIAGNOSIS — F17200 Nicotine dependence, unspecified, uncomplicated: Secondary | ICD-10-CM

## 2013-12-01 HISTORY — PX: LEFT HEART CATHETERIZATION WITH CORONARY ANGIOGRAM: SHX5451

## 2013-12-01 LAB — TROPONIN I
Troponin I: >20 ng/mL (ref <0.30)
Troponin I: >20 ng/mL (ref <0.30)
Troponin I: >20 ng/mL (ref <0.30)

## 2013-12-01 LAB — CBC
HCT: 39.2 % (ref 39.0–52.0)
HEMATOCRIT: 36.6 % — AB (ref 39.0–52.0)
HEMOGLOBIN: 14 g/dL (ref 13.0–17.0)
Hemoglobin: 12.9 g/dL — ABNORMAL LOW (ref 13.0–17.0)
MCH: 30.8 pg (ref 26.0–34.0)
MCH: 31.3 pg (ref 26.0–34.0)
MCHC: 35.7 g/dL (ref 30.0–36.0)
MCHC: 35.2 g/dL (ref 30.0–36.0)
MCV: 86.2 fL (ref 78.0–100.0)
MCV: 88.8 fL (ref 78.0–100.0)
Platelets: 223 10*3/uL (ref 150–400)
Platelets: 193 10*3/uL (ref 150–400)
RBC: 4.55 MIL/uL (ref 4.22–5.81)
RBC: 4.12 MIL/uL — AB (ref 4.22–5.81)
RDW: 12.2 % (ref 11.5–15.5)
RDW: 12.5 % (ref 11.5–15.5)
WBC: 18.9 10*3/uL — ABNORMAL HIGH (ref 4.0–10.5)
WBC: 12.4 10*3/uL — AB (ref 4.0–10.5)

## 2013-12-01 LAB — BASIC METABOLIC PANEL
BUN: 15 mg/dL (ref 6–23)
BUN: 9 mg/dL (ref 6–23)
CALCIUM: 8.3 mg/dL — AB (ref 8.4–10.5)
CHLORIDE: 104 meq/L (ref 96–112)
CO2: 25 mEq/L (ref 19–32)
CO2: 25 meq/L (ref 19–32)
Calcium: 9.3 mg/dL (ref 8.4–10.5)
Chloride: 100 mEq/L (ref 96–112)
Creatinine, Ser: 0.85 mg/dL (ref 0.50–1.35)
Creatinine, Ser: 0.68 mg/dL (ref 0.50–1.35)
GFR calc Af Amer: >90 mL/min (ref >90)
GFR calc Af Amer: >90 mL/min (ref >90)
GFR calc non Af Amer: >90 mL/min (ref >90)
GFR calc non Af Amer: >90 mL/min (ref >90)
GLUCOSE: 122 mg/dL — AB (ref 70–99)
Glucose, Bld: 117 mg/dL — ABNORMAL HIGH (ref 70–99)
POTASSIUM: 4.3 meq/L (ref 3.7–5.3)
Potassium: 4.4 mEq/L (ref 3.7–5.3)
Sodium: 140 mEq/L (ref 137–147)
Sodium: 141 mEq/L (ref 137–147)

## 2013-12-01 LAB — POCT I-STAT TROPONIN I: Troponin i, poc: 0.21 ng/mL (ref 0.00–0.08)

## 2013-12-01 LAB — POCT ACTIVATED CLOTTING TIME: ACTIVATED CLOTTING TIME: 348 s

## 2013-12-01 LAB — MRSA PCR SCREENING: MRSA by PCR: NEGATIVE

## 2013-12-01 SURGERY — LEFT HEART CATHETERIZATION WITH CORONARY ANGIOGRAM
Anesthesia: LOCAL

## 2013-12-01 MED ORDER — SODIUM CHLORIDE 0.9 % IV SOLN: 0.2500 mg/kg/h | INTRAVENOUS | Status: AC

## 2013-12-01 MED ORDER — ONDANSETRON HCL 4 MG/2ML IJ SOLN
INTRAMUSCULAR | Status: AC
  Filled 2013-12-01: qty 2

## 2013-12-01 MED ORDER — ASPIRIN 81 MG PO CHEW
81.0000 mg | CHEWABLE_TABLET | Freq: Every day | ORAL | Status: DC
  Administered 2013-12-01 – 2013-12-03 (×3): 81 mg via ORAL
  Filled 2013-12-01 (×3): qty 1

## 2013-12-01 MED ORDER — BIVALIRUDIN 250 MG IV SOLR
INTRAVENOUS | Status: AC
  Filled 2013-12-01: qty 250

## 2013-12-01 MED ORDER — SODIUM CHLORIDE 0.9 % IJ SOLN
3.0000 mL | Freq: Two times a day (BID) | INTRAMUSCULAR | Status: DC
  Administered 2013-12-01 (×2): 3 mL via INTRAVENOUS
  Administered 2013-12-02: 22:00:00 via INTRAVENOUS

## 2013-12-01 MED ORDER — HEPARIN (PORCINE) IN NACL 100-0.45 UNIT/ML-% IJ SOLN
1000.0000 [IU]/h | INTRAMUSCULAR | Status: DC
  Administered 2013-12-01: 1000 [IU]/h via INTRAVENOUS
  Filled 2013-12-01: qty 250

## 2013-12-01 MED ORDER — TIROFIBAN HCL IV 5 MG/100ML
0.1500 ug/kg/min | INTRAVENOUS | Status: AC
  Administered 2013-12-01 (×3): 0.15 ug/kg/min via INTRAVENOUS
  Filled 2013-12-01 (×5): qty 100

## 2013-12-01 MED ORDER — SODIUM CHLORIDE 0.9 % IV SOLN
INTRAVENOUS | Status: AC
  Administered 2013-12-01 (×2): via INTRAVENOUS

## 2013-12-01 MED ORDER — ATORVASTATIN CALCIUM 80 MG PO TABS
80.0000 mg | ORAL_TABLET | Freq: Every day | ORAL | Status: DC
  Administered 2013-12-01 – 2013-12-02 (×2): 80 mg via ORAL
  Filled 2013-12-01 (×3): qty 1

## 2013-12-01 MED ORDER — SODIUM CHLORIDE 0.9 % IV BOLUS (SEPSIS)
1000.0000 mL | Freq: Once | INTRAVENOUS | Status: AC
  Administered 2013-12-01: 1000 mL via INTRAVENOUS

## 2013-12-01 MED ORDER — TICAGRELOR 90 MG PO TABS
ORAL_TABLET | ORAL | Status: AC
  Filled 2013-12-01: qty 2

## 2013-12-01 MED ORDER — LIDOCAINE HCL (PF) 1 % IJ SOLN
INTRAMUSCULAR | Status: AC
  Filled 2013-12-01: qty 30

## 2013-12-01 MED ORDER — VERAPAMIL HCL 2.5 MG/ML IV SOLN
INTRAVENOUS | Status: AC
  Filled 2013-12-01: qty 2

## 2013-12-01 MED ORDER — LOSARTAN POTASSIUM 25 MG PO TABS
25.0000 mg | ORAL_TABLET | Freq: Every day | ORAL | Status: DC
  Filled 2013-12-01: qty 1

## 2013-12-01 MED ORDER — MIDAZOLAM HCL 2 MG/2ML IJ SOLN
INTRAMUSCULAR | Status: AC
  Filled 2013-12-01: qty 2

## 2013-12-01 MED ORDER — CARVEDILOL 3.125 MG PO TABS
3.1250 mg | ORAL_TABLET | Freq: Two times a day (BID) | ORAL | Status: DC
  Administered 2013-12-01: 3.125 mg via ORAL
  Filled 2013-12-01 (×4): qty 1

## 2013-12-01 MED ORDER — PNEUMOCOCCAL VAC POLYVALENT 25 MCG/0.5ML IJ INJ
0.5000 mL | INJECTION | INTRAMUSCULAR | Status: AC
  Administered 2013-12-02: 0.5 mL via INTRAMUSCULAR
  Filled 2013-12-01: qty 0.5

## 2013-12-01 MED ORDER — LISINOPRIL 5 MG PO TABS
5.0000 mg | ORAL_TABLET | Freq: Every day | ORAL | Status: DC
  Administered 2013-12-01: 5 mg via ORAL
  Filled 2013-12-01: qty 1

## 2013-12-01 MED ORDER — DOPAMINE-DEXTROSE 3.2-5 MG/ML-% IV SOLN
INTRAVENOUS | Status: AC
  Filled 2013-12-01: qty 250

## 2013-12-01 MED ORDER — SODIUM CHLORIDE 0.9 % IJ SOLN: 3.0000 mL | INTRAMUSCULAR | Status: DC | PRN

## 2013-12-01 MED ORDER — NITROGLYCERIN 0.2 MG/ML ON CALL CATH LAB
INTRAVENOUS | Status: AC
  Filled 2013-12-01: qty 1

## 2013-12-01 MED ORDER — PERFLUTREN LIPID MICROSPHERE
INTRAVENOUS | Status: AC
  Administered 2013-12-01: 1 mL
  Filled 2013-12-01: qty 10

## 2013-12-01 MED ORDER — SODIUM CHLORIDE 0.9 % IV SOLN: INTRAVENOUS | Status: DC

## 2013-12-01 MED ORDER — HEPARIN (PORCINE) IN NACL 2-0.9 UNIT/ML-% IJ SOLN
INTRAMUSCULAR | Status: AC
  Filled 2013-12-01: qty 1500

## 2013-12-01 MED ORDER — TICAGRELOR 90 MG PO TABS
90.0000 mg | ORAL_TABLET | Freq: Two times a day (BID) | ORAL | Status: DC
  Administered 2013-12-01 – 2013-12-03 (×4): 90 mg via ORAL
  Filled 2013-12-01 (×8): qty 1

## 2013-12-01 MED ORDER — ACETAMINOPHEN 325 MG PO TABS
650.0000 mg | ORAL_TABLET | ORAL | Status: DC | PRN
  Administered 2013-12-01: 650 mg via ORAL
  Filled 2013-12-01: qty 2

## 2013-12-01 MED ORDER — HEPARIN SODIUM (PORCINE) 5000 UNIT/ML IJ SOLN: 60.0000 [IU]/kg | Freq: Once | INTRAMUSCULAR | Status: DC

## 2013-12-01 MED ORDER — TIROFIBAN HCL IV 5 MG/100ML
INTRAVENOUS | Status: AC
  Administered 2013-12-01: 0.15 ug/kg/min via INTRAVENOUS
  Filled 2013-12-01: qty 100

## 2013-12-01 MED ORDER — HEPARIN SODIUM (PORCINE) 5000 UNIT/ML IJ SOLN
INTRAMUSCULAR | Status: AC
  Filled 2013-12-01: qty 1

## 2013-12-01 MED ORDER — HEPARIN SODIUM (PORCINE) 5000 UNIT/ML IJ SOLN
4000.0000 [IU] | Freq: Once | INTRAMUSCULAR | Status: AC
  Administered 2013-12-01: 4000 [IU] via INTRAVENOUS

## 2013-12-01 MED ORDER — SODIUM CHLORIDE 0.9 % IV SOLN: 250.0000 mL | INTRAVENOUS | Status: DC | PRN

## 2013-12-01 MED ORDER — MORPHINE SULFATE 2 MG/ML IJ SOLN
2.0000 mg | INTRAMUSCULAR | Status: DC | PRN
  Administered 2013-12-02: 2 mg via INTRAVENOUS
  Filled 2013-12-01: qty 1

## 2013-12-01 MED ORDER — MORPHINE SULFATE 4 MG/ML IJ SOLN
4.0000 mg | Freq: Once | INTRAMUSCULAR | Status: AC
  Administered 2013-12-01: 4 mg via INTRAVENOUS
  Filled 2013-12-01: qty 1

## 2013-12-01 MED ORDER — ONDANSETRON HCL 4 MG/2ML IJ SOLN
4.0000 mg | Freq: Four times a day (QID) | INTRAMUSCULAR | Status: DC | PRN
  Administered 2013-12-02: 4 mg via INTRAVENOUS
  Filled 2013-12-01: qty 2

## 2013-12-01 MED ORDER — FENTANYL CITRATE 0.05 MG/ML IJ SOLN
INTRAMUSCULAR | Status: AC
  Filled 2013-12-01: qty 2

## 2013-12-01 NOTE — H&P (View-Only) (Signed)
Patient ID: Austin Vincent, male   DOB: [birth year], 43 y.o.   MRN: [id number]   Physician History and Physical    ELI PATTILLO MRN: [id number] DOB/AGE: 1971/07/21 43 y.o. Admit date: 12/01/2013  Primary Cardiologist: Stanford Breed  HPI:  43 yo with history of CAD and MV prolapse presented tonight with NSTEMI.  Patient had inferior MI at age 1 with PCI to RCA (1999).  Most recently, he had negative stress echo in 12/12.  He was doing well symptomatically until tonight.  Tonight around 9:30 patient developed left-sided chest aching with radiation to left arm during sex.  He had taken Viagra.  He called EMS => he was given NTG sublingual and SBP dropped into the 80s.  With IVF,.SBP is back up to the 110s. He still has 4/10 chest pain with radiation to the left arm.  TnI was elevated and ECG shows new lateral/anterolateral 1/2-1 mm ST depression.   Review of systems complete and found to be negative unless listed above   PMH: 1. CAD: Inferior MI in 1999 with PCI to RCA.  Stress echo in 12/12 was normal. 2. Mitral valve prolapse: Echo (12/12) with EF 55-60%, MVP with mild MR.  3. HTN 4. Hyperlipidemia  Current Facility-Administered Medications  Medication Dose Route Frequency Provider Last Rate Last Dose  . atorvastatin (LIPITOR) tablet 80 mg  80 mg Oral q1800 Larey Dresser, MD      . heparin ADULT infusion 100 units/mL (25000 units/250 mL)  1,000 Units/hr Intravenous Continuous Jabre Heo, RPH 10 mL/hr at 12/01/13 0130 1,000 Units/hr at 12/01/13 0130   Current Outpatient Prescriptions  Medication Sig Dispense Refill  . amLODipine (NORVASC) 5 MG tablet TAKE 1 TABLET ONCE DAILY  90 tablet  2  . aspirin 81 MG tablet Take 81 mg by mouth daily.        Marland Kitchen atorvastatin (LIPITOR) 40 MG tablet Take 1 tablet (40 mg total) by mouth daily.  30 tablet  11  . Coenzyme Q10 (CO Q-10 PO) Take by mouth daily.      . Multiple Vitamin (MULTIVITAMIN) tablet Take 1 tablet by mouth daily.      . niacin  (NIASPAN) 500 MG CR tablet Take 500 mg by mouth at bedtime.        Marland Kitchen omega-3 acid ethyl esters (LOVAZA) 1 G capsule Take by mouth 2 (two) times daily.      . sildenafil (VIAGRA) 100 MG tablet Take 100 mg by mouth daily as needed for erectile dysfunction.         Family History  Problem Relation Age of Onset  . Coronary artery disease Father and mother     History   Social History  . Marital Status: Divorced    Spouse Name: N/A    Number of Children: N/A  . Years of Education: N/A   Occupational History  . Not on file.   Social History Main Topics  . Smoking status: Recently quit, now uses e-cigarette    Types: Cigarettes  . Smokeless tobacco: Not on file  . Alcohol Use: Yes  . Drug Use: No  . Sexual Activity: Yes   Other Topics Concern  . Not on file   Social History Narrative  . No narrative on file     Physical Exam: Blood pressure 93/62, pulse 61, resp. rate 12, SpO2 100.00%.  General: NAD Neck: No JVD, no thyromegaly or thyroid nodule.  Lungs: Clear to auscultation bilaterally with normal respiratory effort. CV: Nondisplaced PMI.  Heart regular S1/S2, no S3/S4, 1/6 HSM LLSB/apex.  No peripheral edema.  No carotid bruit.  Normal pedal pulses.  Abdomen: Soft, nontender, no hepatosplenomegaly, no distention.  Skin: Intact without lesions or rashes.  Neurologic: Alert and oriented x 3.  Psych: Normal affect. Extremities: No clubbing or cyanosis.  HEENT: Normal.   Labs:   Lab Results  Component Value Date   WBC 18.9* 12/01/2013   HGB 14.0 12/01/2013   HCT 39.2 12/01/2013   MCV 86.2 12/01/2013   PLT 223 12/01/2013    Recent Labs Lab 12/01/13 0030  NA 140  K 4.3  CL 100  CO2 25  BUN 15  CREATININE 0.85  CALCIUM 9.3  GLUCOSE 122*  TnI 0.21   EKG: NSR, old inferior MI, 1/2-1 mm lateral and anterolateral ST depression (new)  ASSESSMENT AND PLAN:  43 yo with history of CAD s/p prior inferior MI and MVP presented tonight with NSTEMI.  1. NSTEMI: He has  ongoing chest pain with positive troponin and lateral ECG changes.  He took Viagra earlier today so became hypotensive when given NTG by EMS.  BP is back to normal range now.  Given ongoing pain and limitation in our ability to treat it (would hold off on NTG and beta blocker with recent hypotension), will take directly to the cath lab.  2. MV prolapse: Will get echo to follow mitral regurgitation.   Signed: Loralie Champagne 12/01/2013, 1:35 AM

## 2013-12-01 NOTE — ED Notes (Signed)
Per EMS - pt began having left-sided chest pain/tightness that radiates to neck and left arm - x1 episode n/v and diaphoresis - pt reports he began having this pain after intercourse - pt w/ hx of MI @27y /o w/ stent placement. Pt also admits to taking 50mg  Viagra approx 1930 this evening. Pt took 324mg  ASA prior to EMS arrival, 4mg  zofran and x2 SL nitro administered en route - pt given 368ml NS during short episode of hypotension.

## 2013-12-01 NOTE — ED Notes (Signed)
I stat troponin results given to Dr. Zavitz by B. Everley Evora, EMT 

## 2013-12-01 NOTE — CV Procedure (Signed)
CARDIAC CATHETERIZATION AND PERCUTANEOUS CORONARY INTERVENTION REPORT  NAME:  Austin Vincent   MRN: [id number] DOB:  Dec 03, 1970   ADMIT DATE: 12/01/2013 Procedure Date: 12/01/2013  INTERVENTIONAL CARDIOLOGIST: Leonie Man, M.D., MS PRIMARY CARE PROVIDER: Pcp Not In System PRIMARY CARDIOLOGIST: Kirk Ruths, MD  PATIENT:  Austin Vincent is a 43 y.o. male with PMH of early onset CAD- MI @ age 22 - PCI RCA. 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  Did well until late last PM (1/20-1/21 2015) around 9:30 pm patient developed left-sided chest aching with radiation to left arm during sex. He had taken Viagra. He called EMS => he was given NTG sublingual and SBP dropped into the 80s. With IVF,.SBP is back up to the 110s. He still has 4/10 chest pain with radiation to the left arm. TnI was elevated and ECG shows new lateral/anterolateral 1/2-1 mm ST depression.  The patient was evaluated by Dr. Aundra Dubin (on-call Cardiology) & myself. His pain persisted @ ~4-5/10 range despite Morphine.  Given the High TIMI RISK Score & ongoing pain with limited options for initial medical stabilization, we determined the most appropriate COA was to proceed with cardiac catheterization +/- PCI as Urgent NSTEMI.  PRE-OPERATIVE DIAGNOSIS:    NSTEMI  PROCEDURES PERFORMED:  Overall,  PCI/PTCA of the RCA was extremely difficult requiring two-vessel wiring and complex strategy.  LEFT HEART CATHETERIZATION WITH CORONARY ANGIOGRAPHY  PERCUTANEOUS CORONARY INTERVENTION of MID-DISTAL 100% THROMBOTICALLY OCCLUDED RCA (WITH THROMBECTOMY) - PROMUS PREMIER DES 3.5 MM X 38 MM (3.6 MM)  Aspiration Thrombectomy  PERCUTANEOUS CORONARY ANGIOPLASTY OF RIGHT POSTEROLATERAL BRANCH  PERCUTANEOUS CORONARY ANGIOPLASTY OF THE RIGHT POSTERIOR DESCENDING ARTERY.  PROCEDURE:Consent:  Risks of procedure as well as the alternatives and risks of each  were explained to the (patient/caregiver).  Consent for procedure obtained. Consent for signed by MD and patient with RN witness -- placed on chart.  PROCEDURE: The patient was brought to the 2nd [facility] in the fasting state and prepped and draped in the usual sterile fashion for Right groin or radial access. A modified Allen's test with plethysmography was performed, revealing excellent Ulnar artery collateral flow.  Sterile technique was used including antiseptics, cap, gloves, gown, hand hygiene, mask and sheet.  Skin prep: Chlorhexidine.  Time Out: Verified patient identification, verified procedure, site/side was marked, verified correct patient position, special equipment/implants available, medications/allergies/relevent history reviewed, required imaging and test results available.  Performed  Diagnostic Left Heart Catheterization:  TIG 4.0, JL 3.5,   Access: RIGHT RADIAL Artery: 6 Fr Sheath --   Seldinger technique (Angiocath Micropuncture Kit)  IA Radial Cocktail, IV Angiomax  Right Coronary Artery Angiography: TIG 4.0  Left Coronary Artery Angiography: JL3.5  LV Hemodynamics (LV Gram): JR4 (both Diagnostic & Guide.  LV Gram with JR4 guide)  TR Band:  0350 Hours, 11 mL air  MEDICATIONS:  Anesthesia:  Local Lidocaine 2 ml  Sedation:  4 mg IV Versed, 75 mcg IV fentanyl ;   Premedication: 4000 Units IV Heparin; 324 mg ASA  Omnipaque Contrast: 205 ml  Anticoagulation:  IV Angiomax Bolus & drip  Anti-Platelet Agent:  Aggrestat Bolus & gtt; Brilinta 180 mg  Hemodynamics:  Central Aortic / Mean Pressures:   Pre: 97/75 mmHg; 85 mmHg -->  Post: 105/74 mmHg; 88 mmHg  Left Ventricular Pressures / EDP:   Pre 98/10 mmHg; 16 mmHg --> Post 102/13 mmHg; 22 mmHg  Left Ventriculography:  EF: ~45-55 %  Wall Motion: Basal to mid & apical Hypokinesis.  Coronary Anatomy:  Left Main: Very large caliber vessel that bifurcates into the LAD,  and Circumflex. Angiographically normal. LAD: Quite likely the vessels and written was originally a large caliber vessel, however it tapers proximally to almost 50% stenosis at the takeoff of a proximal diagonal branch. Following that the vessel is 100 occluded. It fills distally at the apex the diagonal collaterals as well as septal septal collaterals. There is also filling from the RV marginal branch. (Post angioplasty revealed collaterals from the RPDA to the distal LAD). The distal third of the vessel perfuses retrograde, however there is no linkup to the proximal occlusion. Distal third is a small-caliber version of what was probably the original LAD.  D1: Moderate caliber vessel that bifurcates proximally. There is a very proximal takeoff at the site of the LAD occlusion. There are 2 branches that are both tortuous with diffuse mild luminal irregularities. The more proximal branch provides distal collaterals to the RPL system. The more distal provides collaterals to the distal LAD.  Left Circumflex: Moderate to large caliber vessel with proximal roughly 50% stenosis before it gives rise to a very proximal OM branch/ramus intermedius. It then goes into the AV groove major branch is an atrial branch and a small-caliber OM 2 with ostial 80% stenosis. The follow on AV groove circumflex is miniscule. There is however collateral flow from the small AV groove branch to the right posterior lateral system.  OM1: Large-caliber very tortuous vessel that takes a hairpin turn proximally. Ears ostial his the 60% stenosis. The rest the vessel is tortuous but free of significant disease. There is relatively confusing collaterals from what appears to be the LAD system to both the PL and the PDA system on the right. It does appear to be some collaterals from the circumflex system as well.   RCA: Large-caliber, dominant vessel with 100% thrombotic occlusion in the distal portion of the mid vessel just prior to the  Crux.    Post initial angioplasty/thrombectomy revealed a moderate caliber PDA as well as smaller moderate caliber PL system. The clot segment appears also to have a potential dissection that is from the original occlusion site almost to the bifurcation distally.  The RV marginal in the proximal mid via collaterals to the PDA as well as what appears to be to a septal perforator of the LAD.  RPDA: Moderate caliber vessel with proximal 80% eccentric long stenosis before the vessel then noted normalizes. There is extensive septal perforator collaterals to the LAD  RPL Sysytem:The RPAV moderate caliber vessel that gives off a smaller moderate caliber RPL that appeared to be subtotally occluded just after the takeoff of the AV nodal artery.  This subtotal occlusion is followed by a 90% stenosis in the mid vessel.  An interview of the images suggest that the thrombotic occlusion of the RCA is likely the culprit vessel. Simply the extent of collaterals to the LAD in a small-caliber distal filling just a chronic total occlusion of the LAD. With the patient actively having symptoms with positive troponins, but likely culprit was final occlusion of what was possibly a subtotal occluded RCA. Perhaps reason for no ST elevations was the extent of collateral flow. Plans were then made to proceed with PTCA/thrombectomy possibly PCI of the RCA. Due to the extensive disease an unknown chain disease, the decision was made to use Aggrastat in addition to the Angiomax was ordered  given. Following the PCI, once the decision was made to proceed with stenting with a DES stents, the patient was loaded with 180 mg Brilinta.  Percutaneous Coronary Intervention:   Guide: 6 Fr   JR 4 Guidewire: BMW    Lesion #1: Distal mid RCA 100% thrombotic occlusion;    100% reduced to 0%; TIMI 0 flow improved to TIMI 3 flow  Predilation Balloon: Sprinter Legend 2.5 mm x 12 mm;   6 Atm x 30 Sec,   Post inflation revealed a significant  thrombus burden after the initial occlusion site. Also the appearance of possible dissection bleeding almost up to the bifurcation. TIMI 2 flow was restored distally however. Aspiration Thrombectomy -- 2 passes with the large vessel thrombectomy catheter were made. Extensive mixed thrombus was aspirated.  Post-aspiration there appeared to be regular lesion beyond the original occlusion site followed by a what appeared to be a focal dissection prior to bifurcation. The posterior lateral branch occluded the subtotal occluded while the RPDA had ostial/proximal significant stenosis.  Predilation Balloon #2: Empira 2.5 mm x 12 mm;   6 Atm x 30 Sec, for 2 inflations distal and proximal at the significant possible dissection site.  At this point, the thought was possibly PTCA only in a very long balloon was used. Predilation Balloon #3: Trek  2.5 mm x 20 mm;   8 Atm x 60 Sec, 2 inflations  Post angioplasty angiography revealed persistent thrombus with again possible dissection just prior to bifurcation. This point a decision made to proceed with PCI using a long drug-eluting stent  Stent: Promus Premier DES 3.5 mm x 38 mm;   Deployed at 12 Atm x 30 Sec,   Postdilated with stent balloon at: 9 Atm x 45 Sec - final diameter 3.6 mm with excellent expansion  Post-stent angiography revealed occlusion of the dissection site with what appeared to be a small amount of thrombus just prior to bifurcation. There is also the ostial/proximal lesion in the PDA as well as the subtotal occlusion of the PL branch. With the amount of thrombus that was present and still having some hazy thrombus present at the bifurcation, the decision was made to perform balloon angioplasty of the downstream vessels to allow for progression of the reduced thrombus downstream. This would then improve outflow.   Lesion #2: Mid RPL 1 just after AV nodal artery 99 % followed by 90% stenosis reduced to roughly 20% TIMI 2 flow pre;   TIMI-3 flow post Pre--dilation Balloon: Sprinter Legend 2.0 mm x 12 mm;   6 Atm x 60 Sec -at distal site;  8 Atm x 60 Sec - more proximal/just distal to the AV nodal artery,    Post-PTCA revealed persistent thrombus at the bifurcation, the balloon was then inflated at that location at 8 Atm x 60 Sec   The balloon was then advanced back to the proximal dilation site:  8 Atm x 60 Sec  Final diameter: 2 mm   Lesion #3: Ostial/proximal RPDA 80% stenosis reduced to roughly 70%;  TIMI 31flow pre;  TIMI-3 flow post Pre--dilation Balloon: Trek 2.5 mm x 12 mm;   6 Atm x 60 Sec -extending from just prior to the RCA bifurcation to the proximal RPDA;    Angiography 5 minutes post inflation revealed significant recoil, however there was TIMI-3 flow distally. The hazy appearance just prior to bifurcation appeared less significant.  At this point, the decision was made to terminate the procedure, as there was now stable blood  pressure, TIMI-3 flow distally with minimal thrombus.  Prior to stent placement, the patient became moderately hypotensive the blood with blood pressures in the 60s date the 70 mmHg rates ranging as low as 38 beats per minute. Dopamine infusion was initiated and increased up to 20 mcg/KG/min. This was reduced and weaned off prior to completion of PCI.  Post deployment angiography in multiple views, with and without guidewire in place revealed excellent stent deployment and lesion coverage.  There was no evidence of residual dissection or perforation. There was a mild amount of hazy appearing filling defect just prior to bifurcation of the RCA the RPDA and RPAV-PL.   Given the patient's relatively strenuous state and restoration of TIMI 3 flow distally with mild PTCA of the downstream vessels, the decision was made at this time, despite the significant lesion in the PDA, 2 terminate the procedure. The intention would be for the patient to return to the Cath Lab either later today versus  tomorrow depending on stability to "relook at the stenoses now that the upstream flow is improved.,  PATIENT DISPOSITION:    The patient was transferred to the PACU holding area in a hemodynamicaly stable, chest pain free condition.  The patient tolerated the procedure well, and there were no complications.  EBL:   < 20 ml  The patient was briefly hypotensive in the emergency room after initial pain medications were given, he then also became hypotensive after aspiration thrombectomy which improved by the completion of  the procedure.  POST-OPERATIVE DIAGNOSIS:    Severe multivessel CAD, with 100% thrombotic occluded distal mid RCA in addition to 100% occluded early mid LAD with left to left and right to left collaterals.  Successful PTCA and stenting of the distal mid to distal RCA with a single Promus Premier DES stent  Residual ostial/proximal RPDA 70-80% stenosis showing recoil at the balloon angioplasty, along with a roughly 20% residual RPL stenosis.  Only mildly reduced EF of roughly 40-50% with what appeared to be mild to moderate basal to mid inferior hypokinesis.  PLAN OF CARE:  Admit to CCU. We'll continue IV Aggrastat for 18 hours, and reduced rate Angiomax for 4 hours. Simply because of the extent of thrombus burden.  Would recommend close reviewing the angiographic images in series to consider bringing the patient back either later on today versus next day to reassess a stent in, and be the significant LAD lesion.  Given the extent of thrombotic and possibly dissected stenosis of the distal RCA, he will need to be on a minimum of 1 year.  Recommend a 2-D echocardiogram to better assess EF.   Leonie Man, M.D., M.S. [facility] [address] [place], [place]  [address]  [phone]  12/01/2013 1:52 AM

## 2013-12-01 NOTE — Progress Notes (Signed)
  Echocardiogram 2D Echocardiogram (with Definity) has been performed.  [place], [place] 12/01/2013, 11:02 AM

## 2013-12-01 NOTE — Care Management Note (Addendum)
    Page 1 of 1   12/03/2013     11:27:52 AM   CARE MANAGEMENT NOTE 12/03/2013  Patient:  Austin Vincent, Austin Vincent   Account Number:  [id number]  Date Initiated:  12/01/2013  Documentation initiated by:  Elissa Hefty  Subjective/Objective Assessment:   adm w mi     Action/Plan:   lives alone,   Anticipated DC Date:  12/03/2013   Anticipated DC Plan:  [place]  CM consult  Medication Assistance      Choice offered to / List presented to:             Status of service:  Completed, signed off Medicare Important Message given?   (If response is "NO", the following Medicare IM given date fields will be blank) Date Medicare IM given:   Date Additional Medicare IM given:    Discharge Disposition:  HOME/SELF CARE  Per UR Regulation:  Reviewed for med. necessity/level of care/duration of stay  If discussed at [place] of Stay Meetings, dates discussed:    Comments:  12/03/13- 1115- Marvetta Gibbons RN, BSN [phone] Pt for d/c today - home with Brilinta- verified that pt has both 30day free and copay cards- per benefit check- per rep at [facility]: auth required [phone], tier 3, $60.00 at retail- per note in chart by Elissa Hefty RNCM- pre Josem Kaufmann has been faxed to [facility]- call made to [place] on [facility]- who has drug in stock- spoke with pt and shared above information.   1/21 1003 debbie dowell rn,bsn gave pt brilinta 30day free and copay assist card. will need prior auth for brilinta. will put prior auth form on shadow chart.

## 2013-12-01 NOTE — Interval H&P Note (Signed)
History and Physical Interval Note:  12/01/2013 1:46 AM  Jeneen Rinks A Swails  has presented today for surgery, with the diagnosis of non stemi -- Ischemic ECG changes, + Troponin & ongoing CP.   The various methods of treatment have been discussed with the patient and family. After consideration of risks, benefits and other options for treatment, the patient has consented to  Procedure(s): LEFT HEART CATHETERIZATION WITH CORONARY ANGIOGRAM (N/A) +/- PCI as a surgical intervention .  The patient's history has been reviewed, patient examined, no change in status, stable for surgery.  I have reviewed the patient's chart and labs.  Questions were answered to the patient's satisfaction.     HARDING,DAVID W  Cath Lab Visit (complete for each Cath Lab visit)  Clinical Evaluation Leading to the Procedure:   ACS: yes  Non-ACS:    Anginal Classification: CCS IV  Anti-ischemic medical therapy: Maximal Therapy (2 or more classes of medications)  Non-Invasive Test Results: No non-invasive testing performed  Prior CABG: No previous CABG

## 2013-12-01 NOTE — Progress Notes (Addendum)
ANTICOAGULATION CONSULT NOTE - Initial Consult  Pharmacy Consult for Heparin  Indication: chest pain/ACS  No Known Allergies  Patient Measurements: ~67 kg  Vital Signs: BP: 93/62 mmHg (01/21 0101) Pulse Rate: 61 (01/21 0101)  Labs:  Recent Labs  12/01/13 0030  CREATININE 0.85    The CrCl is unknown because both a height and weight (above a minimum accepted value) are required for this calculation.  Medical History: Past Medical History  Diagnosis Date  . MYOCARDIAL INFARCTION   . MITRAL REGURGITATION   . HYPERTENSION   . HYPERLIPIDEMIA   . CAD    Assessment: 43 y/o M with cardiac history here with CP. Already received heparin 4000 units BOLUS. CBC in process, don't expect to be abnormal.   Goal of Therapy:  Heparin level 0.3-0.7 units/ml Monitor platelets by anticoagulation protocol: Yes   Plan:  -BOLUS already given -Start heparin drip at 1000 units/hr -6 hour HL at 0800 -Daily CBC/HL -Monitor for bleeding  Taher, Vannote 12/01/2013,1:22 AM  12/01/2013 4:32 AM -Heparin being DC'd post-cath -To continue bivalrudin at reduced rate until 0800 -To continue tirofiban at 0.70mcg/kg/minute for 18 hours  -[place], PharmD

## 2013-12-01 NOTE — Progress Notes (Signed)
  43 y/o male smoker with premature CAD and MVP.  Patient underwent urgent cath early this am by Dr. Ellyn Hack for NSTEMI. Found to have chronic totally occluded LAD and acutely occluded RCA with heavy thrombus burden. /p successful PTCA and stenting of the distal mid to distal RCA with a single Promus Premier DES stent. Treated with aggrastat and angiomax post cath. EF 40-50%. Troponin > 20 this am.   No further CP. Exam benign.  Dr. Angelena Form reviewed cath films and we agree that we will continue with current management plan and not do relook cath unless clinical status changes. Await echo.   Plan: 1) transfer SDU 2) cardiac rehab consult 3) continue ASA, Brillinta, statin, b-blocker. Add ACE-I 4) Discussed smoking cessation (mostly using e-cigs now)  Benay Spice 12:45 PM

## 2013-12-01 NOTE — Progress Notes (Addendum)
Patient ID: Austin Vincent, male   DOB: [birth year], 43 y.o.   MRN: [id number]   Physician History and Physical    ELI PATTILLO MRN: [id number] DOB/AGE: 1971/07/21 43 y.o. Admit date: 12/01/2013  Primary Cardiologist: Stanford Breed  HPI:  43 yo with history of CAD and MV prolapse presented tonight with NSTEMI.  Patient had inferior MI at age 1 with PCI to RCA (1999).  Most recently, he had negative stress echo in 12/12.  He was doing well symptomatically until tonight.  Tonight around 9:30 patient developed left-sided chest aching with radiation to left arm during sex.  He had taken Viagra.  He called EMS => he was given NTG sublingual and SBP dropped into the 80s.  With IVF,.SBP is back up to the 110s. He still has 4/10 chest pain with radiation to the left arm.  TnI was elevated and ECG shows new lateral/anterolateral 1/2-1 mm ST depression.   Review of systems complete and found to be negative unless listed above   PMH: 1. CAD: Inferior MI in 1999 with PCI to RCA.  Stress echo in 12/12 was normal. 2. Mitral valve prolapse: Echo (12/12) with EF 55-60%, MVP with mild MR.  3. HTN 4. Hyperlipidemia  Current Facility-Administered Medications  Medication Dose Route Frequency Provider Last Rate Last Dose  . atorvastatin (LIPITOR) tablet 80 mg  80 mg Oral q1800 Larey Dresser, MD      . heparin ADULT infusion 100 units/mL (25000 units/250 mL)  1,000 Units/hr Intravenous Continuous Jabre Heo, RPH 10 mL/hr at 12/01/13 0130 1,000 Units/hr at 12/01/13 0130   Current Outpatient Prescriptions  Medication Sig Dispense Refill  . amLODipine (NORVASC) 5 MG tablet TAKE 1 TABLET ONCE DAILY  90 tablet  2  . aspirin 81 MG tablet Take 81 mg by mouth daily.        Marland Kitchen atorvastatin (LIPITOR) 40 MG tablet Take 1 tablet (40 mg total) by mouth daily.  30 tablet  11  . Coenzyme Q10 (CO Q-10 PO) Take by mouth daily.      . Multiple Vitamin (MULTIVITAMIN) tablet Take 1 tablet by mouth daily.      . niacin  (NIASPAN) 500 MG CR tablet Take 500 mg by mouth at bedtime.        Marland Kitchen omega-3 acid ethyl esters (LOVAZA) 1 G capsule Take by mouth 2 (two) times daily.      . sildenafil (VIAGRA) 100 MG tablet Take 100 mg by mouth daily as needed for erectile dysfunction.         Family History  Problem Relation Age of Onset  . Coronary artery disease Father and mother     History   Social History  . Marital Status: Divorced    Spouse Name: N/A    Number of Children: N/A  . Years of Education: N/A   Occupational History  . Not on file.   Social History Main Topics  . Smoking status: Recently quit, now uses e-cigarette    Types: Cigarettes  . Smokeless tobacco: Not on file  . Alcohol Use: Yes  . Drug Use: No  . Sexual Activity: Yes   Other Topics Concern  . Not on file   Social History Narrative  . No narrative on file     Physical Exam: Blood pressure 93/62, pulse 61, resp. rate 12, SpO2 100.00%.  General: NAD Neck: No JVD, no thyromegaly or thyroid nodule.  Lungs: Clear to auscultation bilaterally with normal respiratory effort. CV: Nondisplaced PMI.  Heart regular S1/S2, no S3/S4, 1/6 HSM LLSB/apex.  No peripheral edema.  No carotid bruit.  Normal pedal pulses.  Abdomen: Soft, nontender, no hepatosplenomegaly, no distention.  Skin: Intact without lesions or rashes.  Neurologic: Alert and oriented x 3.  Psych: Normal affect. Extremities: No clubbing or cyanosis.  HEENT: Normal.   Labs:   Lab Results  Component Value Date   WBC 18.9* 12/01/2013   HGB 14.0 12/01/2013   HCT 39.2 12/01/2013   MCV 86.2 12/01/2013   PLT 223 12/01/2013    Recent Labs Lab 12/01/13 0030  NA 140  K 4.3  CL 100  CO2 25  BUN 15  CREATININE 0.85  CALCIUM 9.3  GLUCOSE 122*  TnI 0.21   EKG: NSR, old inferior MI, 1/2-1 mm lateral and anterolateral ST depression (new)  ASSESSMENT AND PLAN:  42 yo with history of CAD s/p prior inferior MI and MVP presented tonight with NSTEMI.  1. NSTEMI: He has  ongoing chest pain with positive troponin and lateral ECG changes.  He took Viagra earlier today so became hypotensive when given NTG by EMS.  BP is back to normal range now.  Given ongoing pain and limitation in our ability to treat it (would hold off on NTG and beta blocker with recent hypotension), will take directly to the cath lab.  2. MV prolapse: Will get echo to follow mitral regurgitation.   Signed: Dalton McLean 12/01/2013, 1:35 AM     

## 2013-12-01 NOTE — ED Provider Notes (Signed)
CSN: [id number]     Arrival date & time 12/01/13  0018 History   First MD Initiated Contact with Patient 12/01/13 0037     Chief Complaint  Patient presents with  . Chest Pain   (Consider location/radiation/quality/duration/timing/severity/associated sxs/prior Treatment) HPI Comments: 43 yo male with MI, HTN, lipids, on asa presents with chest tightness radiating down left arm since 10 pm after having intercourse, pt had viagra earlier this evening.  No syncope.  CP different than MI hx.  No recent surgery, blood clot hx, leg pain/ swelling or active CA.  Constant pain.  Nitro on route given prior to knowing he had viagra.  No tearing sensation. Nothing improves. Smoker.  Patient is a 43 y.o. male presenting with chest pain. The history is provided by the patient.  Chest Pain Associated symptoms: nausea and vomiting   Associated symptoms: no abdominal pain, no back pain, no cough, no fever, no headache and no shortness of breath     Past Medical History  Diagnosis Date  . MYOCARDIAL INFARCTION   . MITRAL REGURGITATION   . HYPERTENSION   . HYPERLIPIDEMIA   . CAD    Past Surgical History  Procedure Laterality Date  . Coronary stent placement     Family History  Problem Relation Age of Onset  . Coronary artery disease     History  Substance Use Topics  . Smoking status: Current Some Day Smoker    Types: Cigarettes  . Smokeless tobacco: Not on file  . Alcohol Use: Yes    Review of Systems  Constitutional: Positive for appetite change. Negative for fever and chills.  HENT: Negative for congestion.   Eyes: Negative for visual disturbance.  Respiratory: Positive for chest tightness. Negative for cough and shortness of breath.   Cardiovascular: Positive for chest pain.  Gastrointestinal: Positive for nausea and vomiting. Negative for abdominal pain.  Genitourinary: Negative for dysuria and flank pain.  Musculoskeletal: Negative for back pain, neck pain and neck stiffness.   Skin: Negative for rash.  Neurological: Negative for light-headedness and headaches.    Allergies  Review of patient's allergies indicates no known allergies.  Home Medications   Current Outpatient Rx  Name  Route  Sig  Dispense  Refill  . amLODipine (NORVASC) 5 MG tablet      TAKE 1 TABLET ONCE DAILY   90 tablet   2   . aspirin 81 MG tablet   Oral   Take 81 mg by mouth daily.           Marland Kitchen atorvastatin (LIPITOR) 40 MG tablet   Oral   Take 1 tablet (40 mg total) by mouth daily.   30 tablet   11   . Coenzyme Q10 (CO Q-10 PO)   Oral   Take by mouth daily.         . niacin (NIASPAN) 500 MG CR tablet   Oral   Take 500 mg by mouth at bedtime.            BP 93/62  Pulse 61  Resp 12  SpO2 100% Physical Exam  Nursing note and vitals reviewed. Constitutional: He is oriented to person, place, and time. He appears well-developed and well-nourished.  HENT:  Head: Normocephalic and atraumatic.  Eyes: Conjunctivae are normal. Right eye exhibits no discharge. Left eye exhibits no discharge.  Neck: Normal range of motion. Neck supple. No tracheal deviation present.  Cardiovascular: Normal rate, regular rhythm and intact distal pulses.   No murmur  heard. Pulmonary/Chest: Effort normal and breath sounds normal.  Abdominal: Soft. He exhibits no distension. There is no tenderness. There is no guarding.  Musculoskeletal: He exhibits no edema.  Neurological: He is alert and oriented to person, place, and time.  Skin: Skin is warm. No rash noted.  Psychiatric: He has a normal mood and affect.    ED Course  Procedures (including critical care time) Labs Review Labs Reviewed  CBC - Abnormal; Notable for the following:    WBC 18.9 (*)    All other components within normal limits  BASIC METABOLIC PANEL - Abnormal; Notable for the following:    Glucose, Bld 122 (*)    All other components within normal limits  POCT I-STAT TROPONIN I - Abnormal; Notable for the following:     Troponin i, poc 0.21 (*)    All other components within normal limits  MRSA PCR SCREENING  CBC  BASIC METABOLIC PANEL  TROPONIN I  TROPONIN I  TROPONIN I   Imaging Review No results found.  EKG Interpretation    Date/Time:  Wednesday December 01 2013 00:42:17 EST Ventricular Rate:  104 PR Interval:  140 QRS Duration: 85 QT Interval:  328 QTC Calculation: 431 R Axis:   63 Text Interpretation:  Sinus tachycardia Left atrial enlargement Repol abnrm suggests ischemia, anterolateral Confirmed by Percilla Tweten  MD, Jerzee Jerome ([id number]) on 12/01/2013 1:07:55 AM            MDM   1. NSTEMI (non-ST elevated myocardial infarction)   2. Coronary atherosclerosis of unspecified type of vessel, native or graft   3. Other and unspecified hyperlipidemia   4. ST elevation myocardial infarction (STEMI) of inferior wall, initial episode of care   5. Tobacco use disorder   6. Unspecified essential hypertension   7. Acute myocardial infarction, unspecified site, episode of care unspecified    EKG reviewed, new lateral ST inversions, subtle ST elevation inferior. Discussed with interventionalist on call, reviewed EKGs together, he agrees urgent cath needed--  NSTEMI at this time.   Pain improved with morphine. BP dropped to 80s, fluid bolus given.   Heparin bolus and drip per cardiology. Multiple rechecks, pain improved but not resolved. Reviewed old records, MI hx.   Cath lab paged out.   Troponin elevated. CRITICAL CARE Performed by: Mariea Clonts   Total critical care time: 35 min  Critical care time was exclusive of separately billable procedures and treating other patients.  Critical care was necessary to treat or prevent imminent or life-threatening deterioration.  Critical care was time spent personally by me on the following activities: development of treatment plan with patient and/or surrogate as well as nursing, discussions with consultants, evaluation of patient's response to  treatment, examination of patient, obtaining history from patient or surrogate, ordering and performing treatments and interventions, ordering and review of laboratory studies, ordering and review of radiographic studies, pulse oximetry and re-evaluation of patient's condition.  The patients results and plan were reviewed and discussed.   Any x-rays performed were personally reviewed by myself.   Differential diagnosis were considered with the presenting HPI.   EKG: reviewed two ekgs, similar lateral depression  Admission/ observation were discussed with the admitting physician, patient and/or family and they are comfortable with the plan.  NSTEMI, Chest pain, CAD    Mariea Clonts, MD 12/01/13 [phone]

## 2013-12-02 ENCOUNTER — Telehealth: Payer: Self-pay | Admitting: *Deleted

## 2013-12-02 DIAGNOSIS — F17200 Nicotine dependence, unspecified, uncomplicated: Secondary | ICD-10-CM

## 2013-12-02 LAB — BASIC METABOLIC PANEL
BUN: 7 mg/dL (ref 6–23)
CHLORIDE: 103 meq/L (ref 96–112)
CO2: 28 mEq/L (ref 19–32)
Calcium: 8.6 mg/dL (ref 8.4–10.5)
Creatinine, Ser: 0.76 mg/dL (ref 0.50–1.35)
GFR calc Af Amer: >90 mL/min (ref >90)
GFR calc non Af Amer: >90 mL/min (ref >90)
GLUCOSE: 94 mg/dL (ref 70–99)
Potassium: 4.4 mEq/L (ref 3.7–5.3)
Sodium: 140 mEq/L (ref 137–147)

## 2013-12-02 LAB — CBC
HCT: 35.7 % — ABNORMAL LOW (ref 39.0–52.0)
Hemoglobin: 12.2 g/dL — ABNORMAL LOW (ref 13.0–17.0)
MCH: 31 pg (ref 26.0–34.0)
MCHC: 34.2 g/dL (ref 30.0–36.0)
MCV: 90.6 fL (ref 78.0–100.0)
Platelets: 195 10*3/uL (ref 150–400)
RBC: 3.94 MIL/uL — AB (ref 4.22–5.81)
RDW: 12.5 % (ref 11.5–15.5)
WBC: 10.9 10*3/uL — AB (ref 4.0–10.5)

## 2013-12-02 MED ORDER — METOPROLOL SUCCINATE 12.5 MG HALF TABLET
12.5000 mg | ORAL_TABLET | Freq: Every day | ORAL | Status: DC
  Administered 2013-12-02 – 2013-12-03 (×2): 12.5 mg via ORAL
  Filled 2013-12-02 (×3): qty 1

## 2013-12-02 MED ORDER — TRAZODONE 25 MG HALF TABLET
25.0000 mg | ORAL_TABLET | Freq: Every evening | ORAL | Status: DC | PRN
  Administered 2013-12-02: 25 mg via ORAL
  Filled 2013-12-02: qty 1

## 2013-12-02 MED ORDER — LOSARTAN POTASSIUM 25 MG PO TABS
12.5000 mg | ORAL_TABLET | Freq: Every day | ORAL | Status: DC
  Administered 2013-12-02: 14:00:00 via ORAL
  Filled 2013-12-02 (×3): qty 0.5

## 2013-12-02 MED FILL — Sodium Chloride IV Soln 0.9%: INTRAVENOUS | Qty: 50 | Status: AC

## 2013-12-02 NOTE — Telephone Encounter (Signed)
[facility] approves the Brilinta through 12/01/2014

## 2013-12-02 NOTE — Progress Notes (Signed)
Patient ID: Austin Vincent, male   DOB: 1971/07/18, 43 y.o.   MRN: [id number]   SUBJECTIVE: No further chest pain.  Overall feels ok. No dyspnea.  BP on the low side.  Marland Kitchen aspirin  81 mg Oral Daily  . atorvastatin  80 mg Oral q1800  . losartan  12.5 mg Oral Daily  . metoprolol succinate  12.5 mg Oral Daily  . pneumococcal 23 valent vaccine  0.5 mL Intramuscular Tomorrow-1000  . sodium chloride  3 mL Intravenous Q12H  . Ticagrelor  90 mg Oral BID      Filed Vitals:   12/01/13 1535 12/01/13 2000 12/02/13 0000 12/02/13 0400  BP: 95/62 91/52 82/46  97/57  Pulse:      Temp: 98 F (36.7 C) 98.6 F (37 C) 98.1 F (36.7 C) 98.4 F (36.9 C)  TempSrc: Oral Oral Oral Oral  Resp: 16     Height:      Weight:      SpO2: 99% 99% 98% 99%    Intake/Output Summary (Last 24 hours) at 12/02/13 0737 Last data filed at 12/02/13 0000  Gross per 24 hour  Intake 2241.6 ml  Output   1450 ml  Net  791.6 ml    LABS: Basic Metabolic Panel:  Recent Labs  12/01/13 0030 12/01/13 0828  NA 140 141  K 4.3 4.4  CL 100 104  CO2 25 25  GLUCOSE 122* 117*  BUN 15 9  CREATININE 0.85 0.68  CALCIUM 9.3 8.3*   Liver Function Tests: No results found for this basename: AST, ALT, ALKPHOS, BILITOT, PROT, ALBUMIN,  in the last 72 hours No results found for this basename: LIPASE, AMYLASE,  in the last 72 hours CBC:  Recent Labs  12/01/13 1152 12/02/13 0310  WBC 12.4* 10.9*  HGB 12.9* 12.2*  HCT 36.6* 35.7*  MCV 88.8 90.6  PLT 193 195   Cardiac Enzymes:  Recent Labs  12/01/13 0828 12/01/13 1152 12/01/13 1833  TROPONINI >20.00* >20.00* >20.00*   BNP: No components found with this basename: POCBNP,  D-Dimer: No results found for this basename: DDIMER,  in the last 72 hours Hemoglobin A1C: No results found for this basename: HGBA1C,  in the last 72 hours Fasting Lipid Panel: No results found for this basename: CHOL, HDL, LDLCALC, TRIG, CHOLHDL, LDLDIRECT,  in the last 72  hours Thyroid Function Tests: No results found for this basename: TSH, T4TOTAL, FREET3, T3FREE, THYROIDAB,  in the last 72 hours Anemia Panel: No results found for this basename: VITAMINB12, FOLATE, FERRITIN, TIBC, IRON, RETICCTPCT,  in the last 72 hours  RADIOLOGY: No results found.  PHYSICAL EXAM General: NAD Neck: No JVD, no thyromegaly or thyroid nodule.  Lungs: Clear to auscultation bilaterally with normal respiratory effort. CV: Nondisplaced PMI.  Heart regular S1/S2, no S3/S4, no murmur.  No peripheral edema.  No carotid bruit.  Normal pedal pulses.  Abdomen: Soft, nontender, no hepatosplenomegaly, no distention.  Neurologic: Alert and oriented x 3.  Psych: Normal affect. Extremities: No clubbing or cyanosis.   TELEMETRY: Reviewed telemetry pt in NSR  ASSESSMENT AND PLAN: 43 yo with history of CAD presented with NSTEMI and found to have occluded RCA, now s/p PCI with DES Wednesday early morning.  1. CAD: NSTEMI with extensive clot in RCA and complicated procedure with DES to RCA.  He also had total occlusion of distal LAD with collaterals (old).  Echo yesterday showed EF 50-55% with inferior hypokinesis.  - Continue ASA 81, ticagrelor, statin.  -  Will decrease losartan to 12.5 mg daily and stop Coreg with change to Toprol XL 12.5 mg daily given low blood pressure.  2. Smoking: Needs to quit, strongly counseled.  3. May transfer to floor.  If does well, home tomorrow.   Loralie Champagne 12/02/2013 7:40 AM

## 2013-12-02 NOTE — Progress Notes (Signed)
CARDIAC REHAB PHASE I   PRE:  Rate/Rhythm: 72 SR    BP: sitting 109/68    SaO2:   MODE:  Ambulation: 700 ft   POST:  Rate/Rhythm: 94 SR    BP: sitting 112/73     SaO2:    Tolerated well, no c/o. BP stable. Began ed. Pt plans to quit smoking cigarettes immediately and is thinking of eventually quitting e-cigarettes. Discussed risks. Thinking about CRPII but sts he will call them himself. [phone]  Josephina Shih [place] CES, ACSM 12/02/2013 2:51 PM

## 2013-12-03 ENCOUNTER — Telehealth: Payer: Self-pay | Admitting: Cardiology

## 2013-12-03 DIAGNOSIS — E785 Hyperlipidemia, unspecified: Secondary | ICD-10-CM

## 2013-12-03 DIAGNOSIS — I10 Essential (primary) hypertension: Secondary | ICD-10-CM

## 2013-12-03 DIAGNOSIS — I2510 Atherosclerotic heart disease of native coronary artery without angina pectoris: Secondary | ICD-10-CM

## 2013-12-03 DIAGNOSIS — Z8249 Family history of ischemic heart disease and other diseases of the circulatory system: Secondary | ICD-10-CM

## 2013-12-03 LAB — CBC
HEMATOCRIT: 34.7 % — AB (ref 39.0–52.0)
Hemoglobin: 12 g/dL — ABNORMAL LOW (ref 13.0–17.0)
MCH: 30.9 pg (ref 26.0–34.0)
MCHC: 34.6 g/dL (ref 30.0–36.0)
MCV: 89.4 fL (ref 78.0–100.0)
Platelets: 190 10*3/uL (ref 150–400)
RBC: 3.88 MIL/uL — AB (ref 4.22–5.81)
RDW: 12.3 % (ref 11.5–15.5)
WBC: 9.3 10*3/uL (ref 4.0–10.5)

## 2013-12-03 LAB — LIPID PANEL
CHOL/HDL RATIO: 3.1 ratio
Cholesterol: 119 mg/dL (ref 0–200)
HDL: 39 mg/dL — AB (ref >39)
LDL CALC: 65 mg/dL (ref 0–99)
Triglycerides: 74 mg/dL (ref <150)
VLDL: 15 mg/dL (ref 0–40)

## 2013-12-03 MED ORDER — METOPROLOL SUCCINATE 12.5 MG HALF TABLET: 12.5000 mg | ORAL_TABLET | Freq: Every day | ORAL | Status: DC

## 2013-12-03 MED ORDER — NITROGLYCERIN 0.4 MG SL SUBL: 0.4000 mg | SUBLINGUAL_TABLET | SUBLINGUAL | Status: DC | PRN

## 2013-12-03 MED ORDER — ATORVASTATIN CALCIUM 80 MG PO TABS: 80.0000 mg | ORAL_TABLET | Freq: Every day | ORAL | Status: DC

## 2013-12-03 MED ORDER — ACETAMINOPHEN 325 MG PO TABS: 650.0000 mg | ORAL_TABLET | ORAL | Status: DC | PRN

## 2013-12-03 MED ORDER — TICAGRELOR 90 MG PO TABS: 90.0000 mg | ORAL_TABLET | Freq: Two times a day (BID) | ORAL | Status: DC

## 2013-12-03 NOTE — Progress Notes (Signed)
D/c orders received;IV removed with gauze on, pt remains in stable condition, pt meds and instructions reviewed and given to pt; pt d/c to home 

## 2013-12-03 NOTE — Discharge Instructions (Signed)
Coronary Angiography with Stent Coronary angiography with stent placement is a procedure to widen or open a narrow blood vessel of the heart (coronary artery). When a coronary artery becomes partially blocked, it decreases blood flow to that area. This may lead to chest pain or a heart attack (myocardial infarction). Arteries may become blocked by cholesterol buildup (plaque) in the lining or wall.  A stent is a small piece of metal that looks like a mesh or a spring. Stent placement may be done right after a coronary angiography in which a blocked artery is found or as a treatment for a heart attack.  LET [facility] CARE PROVIDER KNOW ABOUT:  Any allergies you have.   All medicines you are taking, including vitamins, herbs, eye drops, creams, and over-the-counter medicines.   Previous problems you or members of your family have had with the use of anesthetics.   Any blood disorders you have.   Previous surgeries you have had.   Medical conditions you have. RISKS AND COMPLICATIONS Generally, coronary angiography with stent is a safe procedure. However, as with any procedure, complications can occur. Possible complications include:   Damage to the heart or its blood vessels.   A return of blockage.   Bleeding at the site.   Blood clot in another part of the body.   Kidney injury.   Allergic reaction to the dye or contrast used.  BEFORE THE PROCEDURE  Do not eat or drink anything for 6 hours before the procedure.   Ask your health care provider if medicines can be taken with a sip of water.   Your health care provider will make sure you understand the procedure and the risks and potential complications associated with the procedure.  PROCEDURE  You may be given a medicine to help you relax before and during the procedure (sedative). This medicine will be given through an IV tube that is put into one of your veins.   The area where the catheter will be inserted  is shaved and cleaned. This is usually done in the groin but may be done in the fold of your arm (near your elbow) or in the wrist.   A medicine will be given to numb the area where the catheter will be inserted (local anesthetic).   The catheter is inserted into an artery using a guide wire. A type of X-ray (fluoroscopy) is used to help guide the catheter to the opening of the blocked artery.   A dye is then injected into the catheter, and X-rays are taken. The dye helps to show where any narrowing or blockages are located in the heart arteries.   A tiny wire is guided to the blocked spot, and a balloon is inflated to make the artery wider. The stent is expanded and crushes the plaque into the wall of the vessel. The stent holds the area open like a scaffolding and improves the blood flow.   Sometimes the artery may be made wider using a laser or other tools to remove plaque.   When the blood flow is better, the catheter is removed. The lining of the artery will grow over the stent, which stays where it was placed.  AFTER THE PROCEDURE  If the procedure is done through the leg, you will be kept in bed lying flat for about 6 hours. You will be instructed to not bend or cross your legs.   The insertion site will be checked frequently.   The pulse in your  feet or wrist will be checked frequently.   Additional blood tests, X-rays, and electrocardiography may be done. Document Released: 05/04/2003 Document Revised: 08/18/2013 Document Reviewed: 05/06/2013 [facility] Patient Information 2014 [place]. Myocardial Infarction A myocardial infarction (MI) is damage to the heart that is not reversible. It is also called a heart attack. An MI usually occurs when a heart (coronary) artery becomes blocked or narrowed. This cuts off the blood supply to the heart. When one or more of the heart (coronary) arteries becomes blocked, that area of the heart begins to die. This causes pain felt  during an MI.  If you think you might be having an MI, call your local emergency services immediately (911 in [place] It is recommended that you take a 162 mg non-enteric coated aspirin if you do not have an aspirin allergy. Do not drive yourself to the hospital or wait to see if your symptoms go away. The sooner MI is treated, the greater the amount of heart muscle saved. Time is muscle. It can save your life. CAUSES  An MI can occur from:  A gradual buildup of a fatty substance called plaque. When plaque builds up in the arteries, this condition is called atherosclerosis. This buildup can block or reduce the blood supply to the heart artery(s).  A sudden plaque rupture within a heart artery that causes a blood clot (thrombus). A blood clot can block the heart artery which does not allow blood flow to the heart.  A severe tightening (spasm) of the heart artery. This is a less common cause of a heart attack. When a heart artery spasms, it cuts off blood flow through the artery. Spasms can occur in heart arteries that do not have atherosclerosis. RISK FACTORS People at risk for an MI usually have one or more risk factors, such as:  High blood pressure.  High cholesterol.  Smoking.  Gender. Men have a higher heart attack risk.  Overweight/obesity.  Age.  Family history.  Lack of exercise.  Diabetes.  Stress.  Excessive alcohol use.  Street drug use (cocaine and methamphetamines). SYMPTOMS  MI symptoms can vary, such as:  In both men and women, MI symptoms can include the following:  Chest pain. The chest pain may feel like a crushing, squeezing, or "pressure" type feeling. MI pain can be "referred," meaning pain can be caused in one part of the body but felt in another part of the body. Referred MI pain may occur in the left arm, neck, or jaw. Pain may even be felt in the right arm.  Shortness of breath (dyspnea).  Heartburn or indigestion with or without vomiting, shortness  of breath, or sweating (diaphoresis).  Sudden, cold sweats.  Sudden lightheadedness.  Upper back pain.  Women can have unique MI symptoms, such as:  Unexplained feelings of nervousness or anxiety.  Discomfort between the shoulder blades (scapula) or upper back.  Tingling in the hands and arms.  In elderly people (regardless of gender), MI symptoms can be subtle, such as:  Sweating (diaphoresis).  Shortness of breath (dyspnea).  General tiredness (fatigue) or not feeling well (malaise). DIAGNOSIS  Diagnosis of an MI involves several tests such as:  An assessment of your vital signs such as heart rhythm, blood pressure, respiratory rate, and oxygen level.  An EKG (ECG) to look at the electrical activity of your heart.  Blood tests called cardiac markers are drawn at scheduled times to measure proteins or enzymes released by the damaged heart muscle.  A  chest X-ray.  An echocardiogram to evaluate heart motion and blood flow.  Coronary angiography (cardiac catheterization). This is a diagnostic procedure to look at the heart arteries. TREATMENT  Acute Intervention. For an MI, the national standard in the [place] is to have an acute intervention in under 90 minutes from the time you get to the hospital. An acute intervention is a special procedure to open up the heart arteries. It is done in a treatment room called a "catheterization lab" (cath lab). Some hospitals do no have a cath lab. If you are having an MI and the hospital does not have a cath lab, the standard is to transport you to a hospital that has one. In the cath lab, acute intervention includes:  Angioplasty. An angioplasty involves inserting a thin, flexible tube (catheter) into an artery in either your groin or wrist. The catheter is threaded to the heart arteries. A balloon at the end of the catheter is inflated to open a narrowed or blocked heart artery. During an angioplasty procedure, a small mesh tube  (stent) may be used to keep the heart artery open. Depending on your condition and health history, one of two types of stents may be placed:  Drug-eluting stent (DES). A DES is coated with a medicine to prevent scar tissue from growing over the stent. With drug-eluting stents, blood thinning medicine will need to be taken for up to a year.  Bare metal stent. This type of stent has no special coating to keep tissue from growing over it. This type of stent is used if you cannot take blood thinning medicine for a prolonged time or you need surgery in the near future. After a bare metal stent is placed, blood thinning medicine will need to be taken for about a month.  If you are taking blood thinning medicine (anti-platelet therapy) after stent placement, do not stop taking it unless your caregiver says it is okay to do so. Make sure you understand how long you need to take the medicine. Surgical Intervention  If an acute intervention is not successful, surgery may be needed:  Open heart surgery (coronary artery bypass graft, CABG). CABG takes a vein (saphenous vein) from your leg. The vein is then attached to the blocked heart artery which bypasses the blockage. This then allows blood flow to the heart muscle. Additional Interventions  A "clot buster" medicine (thrombolytic) may be given. This medicine can help break up a clot in the heart artery. This medicine may be given if a person cannot get to a cath lab right away.  Intra-aortic balloon pump (IABP). If you have suffered a very severe MI and are too unstable to go to the cath lab or to surgery, an IABP may be used. This is a temporary mechanical device used to increase blood flow to the heart and reduce the workload of the heart until you are stable enough to go to the cath lab or surgery. HOME CARE INSTRUCTIONS After an MI, you may need the following:  Medicine. Take medicine as directed by your caregiver. Medicines after an MI may:  Keep  your blood from clotting easily (blood thinners).  Control your blood pressure.  Help lower your cholesterol.  Control abnormal heart rhythms.  Lifestyle changes. Under the guidance of your caregiver, lifestyle changes include:  Quitting smoking, if you smoke. Your caregiver can help you quit.  Being physically active.  Maintaining a healthy weight.  Eating a heart healthy diet. A dietitian can help  you learn healthy eating options.  Managing diabetes.  Reducing stress.  Limiting alcohol intake. SEEK IMMEDIATE MEDICAL CARE IF:   You have severe chest pain, especially if the pain is crushing or pressure-like and spreads to the arms, back, neck, or jaw. This is an emergency. Do not wait to see if the pain will go away. Get medical help at once. Call your local emergency services (911 in the [place] Do not drive yourself to the hospital.  You have shortness of breath during rest, sleep, or with activity.  You have sudden sweating or clammy skin.  You feel sick to your stomach (nauseous) and throw up (vomit).  You suddenly become lightheaded or dizzy.  You feel your heart beating rapidly or you notice "skipped" beats. MAKE SURE YOU:   Understand these instructions.  Will watch your condition.  Will get help right away if you are not doing well or get worse. Document Released: 10/28/2005 Document Revised: 07/22/2012 Document Reviewed: 04/02/2011 [facility] Patient Information 2014 [place], [place].  Radial Site Care Refer to this sheet in the next few weeks. These instructions provide you with information on caring for yourself after your procedure. Your caregiver may also give you more specific instructions. Your treatment has been planned according to current medical practices, but problems sometimes occur. Call your caregiver if you have any problems or questions after your procedure. HOME CARE INSTRUCTIONS  You may shower the day after the procedure.Remove the bandage  (dressing) and gently wash the site with plain soap and water.Gently pat the site dry.  Do not apply powder or lotion to the site.  Do not submerge the affected site in water for 3 to 5 days.  Inspect the site at least twice daily.  Do not flex or bend the affected arm for 24 hours.  No lifting over 5 pounds (2.3 kg) for 5 days after your procedure.  Do not drive home if you are discharged the same day of the procedure. Have someone else drive you.  You may drive 24 hours after the procedure unless otherwise instructed by your caregiver.  Do not operate machinery or power tools for 24 hours.  A responsible adult should be with you for the first 24 hours after you arrive home. What to expect:  Any bruising will usually fade within 1 to 2 weeks.  Blood that collects in the tissue (hematoma) may be painful to the touch. It should usually decrease in size and tenderness within 1 to 2 weeks. SEEK IMMEDIATE MEDICAL CARE IF:  You have unusual pain at the radial site.  You have redness, warmth, swelling, or pain at the radial site.  You have drainage (other than a small amount of blood on the dressing).  You have chills.  You have a fever or persistent symptoms for more than 72 hours.  You have a fever and your symptoms suddenly get worse.  Your arm becomes pale, cool, tingly, or numb.  You have heavy bleeding from the site. Hold pressure on the site. Document Released: 11/30/2010 Document Revised: 01/20/2012 Document Reviewed: 11/30/2010 [facility] Patient Information 2014 [place], [place].

## 2013-12-03 NOTE — Discharge Summary (Signed)
Patient ID: Austin Vincent,  MRN: [id number], DOB/AGE: 1971-07-19 43 y.o.  Admit date: 12/01/2013 Discharge date: 12/03/2013  Primary Care Provider:  Primary Cardiologist: Dr Stanford Breed  Discharge Diagnoses Principal Problem:   NSTEMI (non-ST elevated myocardial infarction) Active Problems:   CAD- MI- RCA PCI age 32 (1999), and RCA DES 12/01/13 with residual totoal LAD with collaterals   HYPERLIPIDEMIA   HYPERTENSION   TOBACCO ABUSE   Family history of coronary artery disease    Procedures: Urgent cath/ PCI 12/03/13   Hospital Course: Austin Vincent is a 43 y.o. male with PMH of early onset CAD- MI @ age 20 - PCI RCA. There is also a FH of premature CAD. The pt has HLD (on statin, likely hereditary) and is a chronic smoker (in process of quitting). Did well until late PM (1/20-1/21 2015) patient developed left-sided chest pain during sex. He had taken Viagra. He called EMS and was transported to [facility]. He did drop his B/P after NTG x 1. His Troponin was positive and he had 1/2-87mm ST depression on EKG. He continued to have pain and was taken urgently to the cath lab. Cath revealed a chronically occluded LAD with L-L and L-R collaterals. The RCA had a thrombotic proximal stenosis. This was Rx'd with a DES. His EF by echo was 50-55%. The plan is for medical Rx of his LAD. He is felt to be stable for discharge 12/03/13. His medications were adjusted at discharge as he continued to be somewhat hypotensive.  Discharge Vitals:  Blood pressure 107/66, pulse 70, temperature 98.1 F (36.7 C), temperature source Oral, resp. rate 18, height $RemoveBe'5\' 8"'[id number]$  (1.727 m), weight 151 lb 14.4 oz (68.9 kg), SpO2 97.00%.    Labs: Results for orders placed during the hospital encounter of 12/01/13 (from the past 48 hour(s))  CBC     Status: Abnormal   Collection Time    12/01/13 11:52 AM      Result Value Range   WBC 12.4 (*) 4.0 - 10.5 K/uL   RBC 4.12 (*) 4.22 - 5.81 MIL/uL   Hemoglobin 12.9 (*) 13.0 - 17.0  g/dL   HCT 36.6 (*) 39.0 - 52.0 %   MCV 88.8  78.0 - 100.0 fL   MCH 31.3  26.0 - 34.0 pg   MCHC 35.2  30.0 - 36.0 g/dL   RDW 12.5  11.5 - 15.5 %   Platelets 193  150 - 400 K/uL  TROPONIN I     Status: Abnormal   Collection Time    12/01/13 11:52 AM      Result Value Range   Troponin I >20.00 (*) <0.30 ng/mL   Comment:            Due to the release kinetics of cTnI,     a negative result within the first hours     of the onset of symptoms does not rule out     myocardial infarction with certainty.     If myocardial infarction is still suspected,     repeat the test at appropriate intervals.     CRITICAL VALUE NOTED.  VALUE IS CONSISTENT WITH PREVIOUSLY REPORTED AND CALLED VALUE.  TROPONIN I     Status: Abnormal   Collection Time    12/01/13  6:33 PM      Result Value Range   Troponin I >20.00 (*) <0.30 ng/mL   Comment:            Due to the release kinetics of  cTnI,     a negative result within the first hours     of the onset of symptoms does not rule out     myocardial infarction with certainty.     If myocardial infarction is still suspected,     repeat the test at appropriate intervals.     CRITICAL VALUE NOTED.  VALUE IS CONSISTENT WITH PREVIOUSLY REPORTED AND CALLED VALUE.  CBC     Status: Abnormal   Collection Time    12/02/13  3:10 AM      Result Value Range   WBC 10.9 (*) 4.0 - 10.5 K/uL   RBC 3.94 (*) 4.22 - 5.81 MIL/uL   Hemoglobin 12.2 (*) 13.0 - 17.0 g/dL   HCT 35.7 (*) 39.0 - 52.0 %   MCV 90.6  78.0 - 100.0 fL   MCH 31.0  26.0 - 34.0 pg   MCHC 34.2  30.0 - 36.0 g/dL   RDW 12.5  11.5 - 15.5 %   Platelets 195  150 - 400 K/uL  BASIC METABOLIC PANEL     Status: None   Collection Time    12/02/13  8:40 AM      Result Value Range   Sodium 140  137 - 147 mEq/L   Potassium 4.4  3.7 - 5.3 mEq/L   Chloride 103  96 - 112 mEq/L   CO2 28  19 - 32 mEq/L   Glucose, Bld 94  70 - 99 mg/dL   BUN 7  6 - 23 mg/dL   Creatinine, Ser 0.76  0.50 - 1.35 mg/dL   Calcium  8.6  8.4 - 10.5 mg/dL   GFR calc non Af Amer >90  >90 mL/min   GFR calc Af Amer >90  >90 mL/min   Comment: (NOTE)     The eGFR has been calculated using the CKD EPI equation.     This calculation has not been validated in all clinical situations.     eGFR's persistently <90 mL/min signify possible Chronic Kidney     Disease.  CBC     Status: Abnormal   Collection Time    12/03/13  4:49 AM      Result Value Range   WBC 9.3  4.0 - 10.5 K/uL   RBC 3.88 (*) 4.22 - 5.81 MIL/uL   Hemoglobin 12.0 (*) 13.0 - 17.0 g/dL   HCT 34.7 (*) 39.0 - 52.0 %   MCV 89.4  78.0 - 100.0 fL   MCH 30.9  26.0 - 34.0 pg   MCHC 34.6  30.0 - 36.0 g/dL   RDW 12.3  11.5 - 15.5 %   Platelets 190  150 - 400 K/uL  LIPID PANEL     Status: Abnormal   Collection Time    12/03/13  4:49 AM      Result Value Range   Cholesterol 119  0 - 200 mg/dL   Triglycerides 74  <150 mg/dL   HDL 39 (*) >39 mg/dL   Total CHOL/HDL Ratio 3.1     VLDL 15  0 - 40 mg/dL   LDL Cholesterol 65  0 - 99 mg/dL   Comment:            Total Cholesterol/HDL:CHD Risk     Coronary Heart Disease Risk Table                         Men   Women      1/2 Average Risk  3.4   3.3      Average Risk       5.0   4.4      2 X Average Risk   9.6   7.1      3 X Average Risk  23.4   11.0                Use the calculated Patient Ratio     above and the CHD Risk Table     to determine the patient's CHD Risk.                ATP III CLASSIFICATION (LDL):      <100     mg/dL   Optimal      100-129  mg/dL   Near or Above                        Optimal      130-159  mg/dL   Borderline      160-189  mg/dL   High      >190     mg/dL   Very High    Disposition:      Follow-up Information   Follow up with Murray Hodgkins, NP On 12/07/2013. (2 pm)    Specialty:  Nurse Practitioner   Contact information:   [address] [place] [address] [phone]       Discharge Medications:    Medication List    STOP taking these  medications       amLODipine 5 MG tablet  Commonly known as:  NORVASC     sildenafil 100 MG tablet  Commonly known as:  VIAGRA      TAKE these medications       acetaminophen 325 MG tablet  Commonly known as:  TYLENOL  Take 2 tablets (650 mg total) by mouth every 4 (four) hours as needed for headache or mild pain.     aspirin 81 MG tablet  Take 81 mg by mouth daily.     atorvastatin 80 MG tablet  Commonly known as:  LIPITOR  Take 1 tablet (80 mg total) by mouth daily at 6 PM.     COQ-10 PO  Take 1 tablet by mouth daily.     metoprolol succinate 12.5 mg Tb24 24 hr tablet  Commonly known as:  TOPROL-XL  Take 0.5 tablets (12.5 mg total) by mouth daily.     multivitamin tablet  Take 1 tablet by mouth daily.     niacin 500 MG CR tablet  Commonly known as:  NIASPAN  Take 500 mg by mouth at bedtime.     nitroGLYCERIN 0.4 MG SL tablet  Commonly known as:  NITROSTAT  Place 1 tablet (0.4 mg total) under the tongue every 5 (five) minutes as needed for chest pain.     omega-3 acid ethyl esters 1 G capsule  Commonly known as:  LOVAZA  Take 1 g by mouth 2 (two) times daily.     Ticagrelor 90 MG Tabs tablet  Commonly known as:  BRILINTA  Take 1 tablet (90 mg total) by mouth 2 (two) times daily.         Duration of Discharge Encounter: Greater than 30 minutes including physician time.  Angelena Form PA-C 12/03/2013 10:18 AM

## 2013-12-03 NOTE — Progress Notes (Signed)
[id number] Cardiac Rehab Completed MI education with pt. He voices understanding. Encouraged smoking cessation, stress reduction and medication compliance.  Deon Pilling, RN 12/03/2013 11:18 AM

## 2013-12-03 NOTE — Telephone Encounter (Signed)
Returned call to pharmacy and gave pager number for [employer], PA-C, per Dr. Debara Pickett.

## 2013-12-03 NOTE — Progress Notes (Addendum)
    Subjective:  No CP, no SOB, no dizziness, no bleeding. Feels good. Original presentation was left sided ache during sex. Prior cath note reviewed.   Objective:  Vital Signs in the last 24 hours: Temp:  [98 F (36.7 C)-98.2 F (36.8 C)] 98.1 F (36.7 C) (01/23 0449) Pulse Rate:  [59-60] 59 (01/23 0449) Resp:  [18-20] 18 (01/23 0449) BP: (89-100)/(51-65) 89/51 mmHg (01/23 0449) SpO2:  [96 %-100 %] 97 % (01/23 0449)  Intake/Output from previous day: 01/22 0701 - 01/23 0700 In: 483 [P.O.:480; I.V.:3] Out: -    Physical Exam: General: Well developed, well nourished, in no acute distress. Head:  Normocephalic and atraumatic. Lungs: Clear to auscultation and percussion. Heart: Normal S1 and S2.  No murmur, rubs or gallops.  Abdomen: soft, non-tender, positive bowel sounds. Extremities: No clubbing or cyanosis. No edema. Cath site c/d/i Neurologic: Alert and oriented x 3.    Lab Results:  Recent Labs  12/02/13 0310 12/03/13 0449  WBC 10.9* 9.3  HGB 12.2* 12.0*  PLT 195 190    Recent Labs  12/01/13 0828 12/02/13 0840  NA 141 140  K 4.4 4.4  CL 104 103  CO2 25 28  GLUCOSE 117* 94  BUN 9 7  CREATININE 0.68 0.76    Recent Labs  12/01/13 1152 12/01/13 1833  TROPONINI >20.00* >20.00*    Recent Labs  12/03/13 0449  CHOL 119      Telemetry: No adverse rhythms.  Personally viewed.    Cardiac Studies:  Cath 1/21: Severe multivessel CAD, with 100% thrombotic occluded distal mid RCA in addition to 100% occluded early mid LAD with left to left and right to left collaterals.   Successful PTCA and stenting of the distal mid to distal RCA with a single Promus Premier DES stent  Residual ostial/proximal RPDA 70-80% stenosis showing recoil at the balloon angioplasty, along with a roughly 20% residual RPL stenosis.  ECHO: 1/21: - Left ventricle: The cavity size was normal. Wall thickness was normal. Systolic function was normal. The estimated ejection  fraction was in the range of 50% to 55%. There is mild hypokinesis of the inferior myocardium. Doppler parameters are consistent with abnormal left ventricular relaxation (grade 1 diastolic dysfunction). - Mitral valve: Mild regurgitation. - Pulmonary arteries: PA peak pressure: 28mm Hg (S).    Assessment/Plan:   43 yo with history of CAD presented with NSTEMI and found to have occluded RCA, now s/p PCI with DES Wednesday early morning.    1. CAD: NSTEMI with extensive clot in RCA and complicated procedure with DES to RCA. He also had total occlusion of distal LAD with collaterals (old). Echo yesterday showed EF 50-55% with inferior hypokinesis.  - Continue ASA 81, ticagrelor, high dose statin.  - Will stop losartan to 12.5 mg daily and continue Toprol XL 12.5 mg daily given low blood pressure which is asymptomatic.  - DAPT - Cardiac rehab   2. Smoking: Needs to quit, strongly counseled. E cigs  3. HTN - now hypotensive. Asymptomatic. Stopping ARB. Continue with low dose Bb given MI.   4. Familial hyperlipidemia - high dose statin. First MI age 43. PCI to RCA.   -F/U in one week with Dr. Stanford Breed or APP if he is unavailable.     Austin Vincent, [name] 12/03/2013, 9:13 AM

## 2013-12-03 NOTE — Telephone Encounter (Signed)
Pt is there waiting-needs clarification on his metoprolol prescription.

## 2013-12-03 NOTE — Discharge Summary (Signed)
Patient personally seen and examined, agree with above. Please see prior note for full details. Discharge time 35 minutes spent with patient, information, data, appointment, medication.

## 2013-12-06 ENCOUNTER — Telehealth: Payer: Self-pay | Admitting: Nurse Practitioner

## 2013-12-06 NOTE — Telephone Encounter (Signed)
Pt states he is able to work from home and would like a note to give to his HR dept saying he is allowed to return to work.   Instructed him to get one after he sees NP tomorrow at 2 PM, since the dc instructions do not specify his activity instructions.  Pt states he is feeling well and will be at his f/u tomorrow.

## 2013-12-06 NOTE — Telephone Encounter (Signed)
New problem   Pt was in the hospital last week and need a note to return to work for today. Please call pt. He need letter to be dated for today or tomorrow.

## 2013-12-07 ENCOUNTER — Encounter: Payer: Self-pay | Admitting: *Deleted

## 2013-12-07 ENCOUNTER — Ambulatory Visit (INDEPENDENT_AMBULATORY_CARE_PROVIDER_SITE_OTHER): Payer: 59 | Admitting: Nurse Practitioner

## 2013-12-07 ENCOUNTER — Encounter: Payer: Self-pay | Admitting: Nurse Practitioner

## 2013-12-07 VITALS — BP 120/74 | HR 55 | Ht 68.0 in | Wt 147.0 lb

## 2013-12-07 DIAGNOSIS — E785 Hyperlipidemia, unspecified: Secondary | ICD-10-CM

## 2013-12-07 DIAGNOSIS — F17200 Nicotine dependence, unspecified, uncomplicated: Secondary | ICD-10-CM

## 2013-12-07 DIAGNOSIS — Z720 Tobacco use: Secondary | ICD-10-CM

## 2013-12-07 DIAGNOSIS — I2510 Atherosclerotic heart disease of native coronary artery without angina pectoris: Secondary | ICD-10-CM

## 2013-12-07 NOTE — Progress Notes (Signed)
Patient Name: Austin Vincent Date of Encounter: 12/07/2013  Primary Care Provider:  Pcp Not In System Primary Cardiologist:  B. Stanford Breed, MD   Patient Profile  43 year old male with history of CAD and recent non-ST elevation MI who presents for clinic followup.  Problem List   Past Medical History  Diagnosis Date  . CAD (coronary artery disease)     a. s/p MI @ age 68 with PCI of the RCA;  b. CTO of the LAD;  c. 11/2013 NSTEMI/PCI: LM nl, LAD 100 L->L collats, LCX 50p, OM2 80p, OM1 60ost, RCA 100p (3.5x38 Promus DES), RPL 99/41m PTCA only, EF 40-50%;  c. 11/2013 Echo: EF 50-55%, Gr1 DD, mild MR.  Marland Kitchen MITRAL REGURGITATION   . HYPERTENSION   . HYPERLIPIDEMIA   . Tobacco abuse    Past Surgical History  Procedure Laterality Date  . Coronary stent placement      Allergies  No Known Allergies  HPI  43 year old male with prior history of coronary artery disease beginning at age 54.  He was recently hospitalized at [facility] secondary to recurrent chest pain and ruled in for non-ST elevation MI.  He underwent urgent diagnostic catheterization revealing a chronic total occlusion of the LAD with a new thrombotic occlusion of right coronary artery and severe disease in the right posterior lateral branch of the right coronary artery.  The RCA was successfully stented with a promus drug loading stent while the RPL was treated with PTCA only.  Since discharge from the hospital he's been doing well without recurrence of chest pain or dyspnea.  He is either get back to work.  He has been likely riding a stationary bike for 10 minutes twice a day without symptoms.  He denies PND, orthopnea, dizziness, CT, edema, or early satiety.  He continues to use an E-cigarette during the week but was to smoke about half a pack of cigarettes over a weekend.  Home Medications  Prior to Admission medications   Medication Sig Start Date End Date Taking? Authorizing Provider  acetaminophen (TYLENOL) 325 MG  tablet Take 2 tablets (650 mg total) by mouth every 4 (four) hours as needed for headache or mild pain. 12/03/13  Yes Erlene Quan, PA-C  aspirin 81 MG tablet Take 81 mg by mouth daily.    Yes Historical Provider, MD  atorvastatin (LIPITOR) 80 MG tablet Take 1 tablet (80 mg total) by mouth daily at 6 PM. 12/03/13  Yes Erlene Quan, PA-C  Coenzyme Q10 (COQ-10 PO) Take 1 tablet by mouth daily.   Yes Historical Provider, MD  metoprolol succinate (TOPROL-XL) 12.5 mg TB24 24 hr tablet Take 0.5 tablets (12.5 mg total) by mouth daily. 12/03/13  Yes Erlene Quan, PA-C  Multiple Vitamin (MULTIVITAMIN) tablet Take 1 tablet by mouth daily.   Yes Historical Provider, MD  niacin (NIASPAN) 500 MG CR tablet Take 500 mg by mouth at bedtime.    Yes Historical Provider, MD  nitroGLYCERIN (NITROSTAT) 0.4 MG SL tablet Place 1 tablet (0.4 mg total) under the tongue every 5 (five) minutes as needed for chest pain. 12/03/13  Yes Erlene Quan, PA-C  omega-3 acid ethyl esters (LOVAZA) 1 G capsule Take 1 g by mouth 2 (two) times daily.   Yes Historical Provider, MD  Ticagrelor (BRILINTA) 90 MG TABS tablet Take 1 tablet (90 mg total) by mouth 2 (two) times daily. 12/03/13  Yes Erlene Quan, PA-C    Review of Systems  As above, he  is doing well and denies chest pain or dyspnea.  All other systems reviewed and are otherwise negative except as noted above.  Physical Exam  Blood pressure 120/74, pulse 55, height 5\' 8"  (1.727 m), weight 147 lb (66.679 kg).  General: Pleasant, NAD Psych: Normal affect. Neuro: Alert and oriented X 3. Moves all extremities spontaneously. HEENT: Normal  Neck: Supple without bruits or JVD. Lungs:  Resp regular and unlabored, CTA. Heart: RRR no s3, s4, or murmurs. Abdomen: Soft, non-tender, non-distended, BS + x 4.  Extremities: No clubbing, cyanosis or edema. DP/PT/Radials 2+ and equal bilaterally.  Accessory Clinical Findings  ECG - sinus bradycardia, 55, LVH with repolarization  abnormalities including inferolateral T-wave inversion.  T Changes are more pronounced than on previous ECG.  Assessment & Plan  1.  Coronary artery disease: Status post recent non-ST segment elevation myocardial infarction.  Is not having any chest pain or dyspnea and is beginning to exercise likely.  He remains on aspirin, statin, beta blocker, and brilinta therapy and is tolerating this well.  He does not plan on participating in cardiac rehabilitation.  2.  Hypertension: Stable.  3.  Hyperlipidemia: Continue high potency statin therapy.  He will require followup lipids and LFTs in about 8 weeks.  4.  Tobacco/nicotine abuse: He continues to use electronic cigarettes and also smokes cigarettes over the weekend.  Complete cessation advice.  5.  Disposition: Patient will followup with Dr. Stanford Breed in approximately 3 months.    Murray Hodgkins, NP 12/07/2013, 5:07 PM

## 2013-12-07 NOTE — Patient Instructions (Signed)
Your physician recommends that you return for a FASTING lipid profile: 8 weeks  Your physician wants you to follow-up in: 3 months with Dr Trellis Moment will receive a reminder letter in the mail two months in advance. If you don't receive a letter, please call our office to schedule the follow-up appointment.

## 2014-01-20 ENCOUNTER — Telehealth: Payer: Self-pay | Admitting: *Deleted

## 2014-01-20 ENCOUNTER — Encounter: Payer: Self-pay | Admitting: *Deleted

## 2014-01-20 NOTE — Telephone Encounter (Signed)
Jody from [employer] calling about pt having only a dental cleaning. Pt is in the dentist office at this time. Pt has had a DES & PTCA January 2015.  Estella Husk PA & Dr. Burt Knack reviewed & as long as is having a prophylactic cleaning it is acceptable.  Faxed letter as requested pt cleared for dental cleaning today Horton Chin RN

## 2014-02-02 ENCOUNTER — Other Ambulatory Visit: Payer: 59

## 2014-03-16 ENCOUNTER — Other Ambulatory Visit (INDEPENDENT_AMBULATORY_CARE_PROVIDER_SITE_OTHER): Payer: 59

## 2014-03-16 DIAGNOSIS — E785 Hyperlipidemia, unspecified: Secondary | ICD-10-CM

## 2014-03-16 DIAGNOSIS — I2510 Atherosclerotic heart disease of native coronary artery without angina pectoris: Secondary | ICD-10-CM

## 2014-03-16 LAB — HEPATIC FUNCTION PANEL
ALK PHOS: 55 U/L (ref 39–117)
ALT: 35 U/L (ref 0–53)
AST: 29 U/L (ref 0–37)
Albumin: 4.2 g/dL (ref 3.5–5.2)
BILIRUBIN DIRECT: 0.1 mg/dL (ref 0.0–0.3)
TOTAL PROTEIN: 7 g/dL (ref 6.0–8.3)
Total Bilirubin: 0.7 mg/dL (ref 0.2–1.2)

## 2014-03-16 LAB — LIPID PANEL
CHOLESTEROL: 131 mg/dL (ref 0–200)
HDL: 46.8 mg/dL (ref >39.00)
LDL Cholesterol: 67 mg/dL (ref 0–99)
TRIGLYCERIDES: 85 mg/dL (ref 0.0–149.0)
Total CHOL/HDL Ratio: 3
VLDL: 17 mg/dL (ref 0.0–40.0)

## 2014-04-12 ENCOUNTER — Other Ambulatory Visit: Payer: Self-pay

## 2014-04-12 MED ORDER — METOPROLOL SUCCINATE 12.5 MG HALF TABLET: 12.5000 mg | ORAL_TABLET | Freq: Every day | ORAL | Status: DC

## 2014-08-25 ENCOUNTER — Other Ambulatory Visit: Payer: Self-pay

## 2014-08-25 MED ORDER — METOPROLOL SUCCINATE 12.5 MG HALF TABLET: 12.5000 mg | ORAL_TABLET | Freq: Every day | ORAL | Status: DC

## 2014-10-20 ENCOUNTER — Encounter (HOSPITAL_COMMUNITY): Payer: Self-pay | Admitting: Cardiology

## 2014-12-07 ENCOUNTER — Other Ambulatory Visit: Payer: Self-pay | Admitting: Cardiovascular Disease

## 2014-12-31 ENCOUNTER — Other Ambulatory Visit: Payer: Self-pay | Admitting: Cardiology

## 2015-04-30 ENCOUNTER — Other Ambulatory Visit: Payer: Self-pay | Admitting: Cardiology

## 2015-05-01 ENCOUNTER — Other Ambulatory Visit: Payer: Self-pay

## 2015-05-01 MED ORDER — METOPROLOL SUCCINATE ER 25 MG PO TB24: 12.5000 mg | ORAL_TABLET | Freq: Every day | ORAL | Status: DC

## 2015-05-15 ENCOUNTER — Other Ambulatory Visit: Payer: Self-pay | Admitting: Cardiology

## 2015-05-16 ENCOUNTER — Other Ambulatory Visit: Payer: Self-pay

## 2015-05-16 MED ORDER — NITROGLYCERIN 0.4 MG SL SUBL: SUBLINGUAL_TABLET | SUBLINGUAL | Status: DC

## 2015-05-16 MED ORDER — ATORVASTATIN CALCIUM 80 MG PO TABS: ORAL_TABLET | ORAL | Status: DC

## 2015-08-11 ENCOUNTER — Other Ambulatory Visit: Payer: Self-pay | Admitting: Cardiology

## 2015-09-04 NOTE — Progress Notes (Signed)
HPI: FU coronary artery disease. The patient had an inferior myocardial infarction in 1999 at age 44. He had a stent to the right coronary artery at that time. Admitted with non-ST elevation myocardial infarction in January 2015. The LAD was occluded (chronic); the right coronary was also occluded. Ejection fraction 45-55%. The patient had PCI of his right coronary artery and PDA. Echocardiogram January 2015 showed ejection fraction 50-55% and grade 1 diastolic dysfunction. Mild mitral regurgitation. Since I last saw him, the patient has dyspnea with more extreme activities but not with routine activities. It is relieved with rest. It is not associated with chest pain. There is no orthopnea, PND or pedal edema. There is no syncope or palpitations. There is no exertional chest pain.   Current Outpatient Prescriptions  Medication Sig Dispense Refill  . acetaminophen (TYLENOL) 325 MG tablet Take 2 tablets (650 mg total) by mouth every 4 (four) hours as needed for headache or mild pain. (Patient taking differently: Take 650 mg by mouth as needed for headache or mild pain. )    . aspirin 81 MG tablet Take 81 mg by mouth daily.     Marland Kitchen atorvastatin (LIPITOR) 80 MG tablet Take 1 tablet by mouth  daily at 6PM 30 tablet 0  . BRILINTA 90 MG TABS tablet Take 1 tablet by mouth  twice a day (Patient taking differently: Take 1 tablet by mouth  once a day) 180 tablet 1  . metoprolol succinate (TOPROL-XL) 25 MG 24 hr tablet Take 0.5 tablets (12.5 mg total) by mouth daily. 45 tablet 0  . niacin (NIASPAN) 500 MG CR tablet Take 500 mg by mouth at bedtime.     . nitroGLYCERIN (NITROSTAT) 0.4 MG SL tablet Dissolve 1 tablet under the tongue every 5 minutes as  needed for chest pain 25 tablet 0   No current facility-administered medications for this visit.     Past Medical History  Diagnosis Date  . CAD (coronary artery disease)     a. s/p MI @ age 68 with PCI of the RCA;  b. CTO of the LAD;  c. 11/2013  NSTEMI/PCI: LM nl, LAD 100 L->L collats, LCX 50p, OM2 80p, OM1 60ost, RCA 100p (3.5x38 Promus DES), RPL 99/26m PTCA only, EF 40-50%;  c. 11/2013 Echo: EF 50-55%, Gr1 DD, mild MR.  Marland Kitchen MITRAL REGURGITATION   . HYPERTENSION   . HYPERLIPIDEMIA   . Tobacco abuse     Past Surgical History  Procedure Laterality Date  . Coronary stent placement    . Left heart catheterization with coronary angiogram N/A 12/01/2013    Procedure: LEFT HEART CATHETERIZATION WITH CORONARY ANGIOGRAM;  Surgeon: Leonie Man, MD;  Location: [facility] CATH LAB;  Service: Cardiovascular;  Laterality: N/A;    Social History   Social History  . Marital Status: Divorced    Spouse Name: N/A  . Number of Children: N/A  . Years of Education: N/A   Occupational History  . Not on file.   Social History Main Topics  . Smoking status: Current Some Day Smoker    Types: Cigarettes  . Smokeless tobacco: Not on file  . Alcohol Use: Yes  . Drug Use: No  . Sexual Activity: Yes   Other Topics Concern  . Not on file   Social History Narrative    ROS: no fevers or chills, productive cough, hemoptysis, dysphasia, odynophagia, melena, hematochezia, dysuria, hematuria, rash, seizure activity, orthopnea, PND, pedal edema, claudication. Remaining systems are negative.  Physical Exam: Well-developed well-nourished in no acute distress.  Skin is warm and dry.  HEENT is normal.  Neck is supple.  Chest is clear to auscultation with normal expansion.  Cardiovascular exam is regular rate and rhythm.  Abdominal exam nontender or distended. No masses palpated. Extremities show no edema. neuro grossly intact  ECG Sinus rhythm at a rate of 68. Inferior infarct. Lateral T-wave inversion.

## 2015-09-05 ENCOUNTER — Ambulatory Visit (INDEPENDENT_AMBULATORY_CARE_PROVIDER_SITE_OTHER): Payer: 59 | Admitting: Cardiology

## 2015-09-05 ENCOUNTER — Encounter: Payer: Self-pay | Admitting: Cardiology

## 2015-09-05 DIAGNOSIS — I2510 Atherosclerotic heart disease of native coronary artery without angina pectoris: Secondary | ICD-10-CM

## 2015-09-05 MED ORDER — METOPROLOL SUCCINATE ER 50 MG PO TB24: 50.0000 mg | ORAL_TABLET | Freq: Every day | ORAL | Status: DC

## 2015-09-05 MED ORDER — ATORVASTATIN CALCIUM 80 MG PO TABS: ORAL_TABLET | ORAL | Status: DC

## 2015-09-05 MED ORDER — NICOTINE 14 MG/24HR TD PT24: 14.0000 mg | MEDICATED_PATCH | Freq: Every day | TRANSDERMAL | Status: DC

## 2015-09-05 MED ORDER — NICOTINE POLACRILEX 4 MG MT GUM: 4.0000 mg | CHEWING_GUM | OROMUCOSAL | Status: DC | PRN

## 2015-09-05 MED ORDER — NICOTINE 7 MG/24HR TD PT24: 7.0000 mg | MEDICATED_PATCH | Freq: Every day | TRANSDERMAL | Status: DC

## 2015-09-05 NOTE — Assessment & Plan Note (Signed)
The pressure elevated. Increase Toprol to 50 mg daily.

## 2015-09-05 NOTE — Assessment & Plan Note (Signed)
Patient counseled on discontinuing.We have provided a prescription for nicotine patch and nicotine gum.

## 2015-09-05 NOTE — Assessment & Plan Note (Signed)
Continue aspirin and statin. Discontinue brilinta. 

## 2015-09-05 NOTE — Patient Instructions (Signed)
Medication Instructions:   INCREASE METOPROLOL TO 50 MG ONCE DAILY  START NICODERM 14 MG [PATCH ONCE DAILY X 28 DAYS THEN DECREASE TO 7 MG DAILY  STOP NIASPAN  STOP BRILINTA  Labwork:  Your physician recommends that you return for lab work PRIOR TO EATING IN 4 WEEKS   Follow-Up:  Your physician wants you to follow-up in: [place] will receive a reminder letter in the mail two months in advance. If you don't receive a letter, please call our office to schedule the follow-up appointment.   If you need a refill on your cardiac medications before your next appointment, please call your pharmacy.

## 2015-09-05 NOTE — Assessment & Plan Note (Signed)
Discontinue Niaspan. Continue Lipitor. Check lipids and liver.

## 2015-09-08 ENCOUNTER — Telehealth: Payer: Self-pay

## 2015-09-08 NOTE — Telephone Encounter (Signed)
Prior auth for both Nicotine gum and Nicotine patches sent to [facility].

## 2015-09-13 ENCOUNTER — Telehealth: Payer: Self-pay

## 2015-09-13 NOTE — Telephone Encounter (Signed)
Approval for both Nicoderm CQ patch and Nicorette gum.  [id number], and [id number]. Good through 09/07/2016.

## 2016-01-18 ENCOUNTER — Other Ambulatory Visit: Payer: Self-pay | Admitting: Cardiology

## 2016-01-18 NOTE — Telephone Encounter (Signed)
brilinta discontinued by MD 08/2015

## 2016-02-09 ENCOUNTER — Other Ambulatory Visit: Payer: Self-pay | Admitting: Cardiology

## 2016-08-16 ENCOUNTER — Other Ambulatory Visit: Payer: Self-pay | Admitting: Cardiology

## 2016-10-13 ENCOUNTER — Other Ambulatory Visit: Payer: Self-pay | Admitting: Cardiology

## 2016-10-13 DIAGNOSIS — I2510 Atherosclerotic heart disease of native coronary artery without angina pectoris: Secondary | ICD-10-CM

## 2016-11-07 ENCOUNTER — Other Ambulatory Visit: Payer: Self-pay | Admitting: Cardiology

## 2016-12-09 ENCOUNTER — Other Ambulatory Visit: Payer: Self-pay | Admitting: Cardiology

## 2017-01-02 ENCOUNTER — Other Ambulatory Visit: Payer: Self-pay | Admitting: Cardiology

## 2017-01-02 DIAGNOSIS — I2510 Atherosclerotic heart disease of native coronary artery without angina pectoris: Secondary | ICD-10-CM

## 2018-01-05 ENCOUNTER — Telehealth: Payer: Self-pay | Admitting: Cardiology

## 2018-01-05 DIAGNOSIS — I2510 Atherosclerotic heart disease of native coronary artery without angina pectoris: Secondary | ICD-10-CM

## 2018-01-05 MED ORDER — ATORVASTATIN CALCIUM 80 MG PO TABS: 80.0000 mg | ORAL_TABLET | Freq: Every day | ORAL | 0 refills | Status: DC

## 2018-01-05 MED ORDER — METOPROLOL SUCCINATE ER 50 MG PO TB24
50.0000 mg | ORAL_TABLET | Freq: Every day | ORAL | 0 refills | Status: DC
Start: 2018-01-05 — End: 2018-04-24

## 2018-01-05 MED ORDER — METOPROLOL SUCCINATE ER 50 MG PO TB24: 50.0000 mg | ORAL_TABLET | Freq: Every day | ORAL | 1 refills | Status: DC

## 2018-01-05 NOTE — Telephone Encounter (Signed)
°*  STAT* If patient is at the pharmacy, call can be transferred to refill team.   1. Which medications need to be refilled? (please list name of each medication and dose if known) Atorvastatin 80 mg // Metoprolol Succinate 50 mg  2. Which pharmacy/location (including street and city if local pharmacy) is medication to be sent to?[facility]   3. Do they need a 30 day or 90 day supply? [place]

## 2018-01-05 NOTE — Addendum Note (Signed)
Addended by: Venetia Maxon on: 01/05/2018 03:07 PM   Modules accepted: Orders

## 2018-02-17 NOTE — Progress Notes (Deleted)
HPI: FU coronary artery disease. The patient had an inferior myocardial infarction in 1999 at age 47. He had a stent to the right coronary artery at that time. Admitted with non-ST elevation myocardial infarction in January 2015. The LAD was occluded (chronic); the right coronary was also occluded. Ejection fraction 45-55%. The patient had PCI of his right coronary artery and PDA. Echocardiogram January 2015 showed ejection fraction 50-55% and grade 1 diastolic dysfunction. Mild mitral regurgitation. Since I last saw him 10/16,    Current Outpatient Medications  Medication Sig Dispense Refill  . acetaminophen (TYLENOL) 325 MG tablet Take 2 tablets (650 mg total) by mouth every 4 (four) hours as needed for headache or mild pain. (Patient taking differently: Take 650 mg by mouth as needed for headache or mild pain. )    . aspirin 81 MG tablet Take 81 mg by mouth daily.     Marland Kitchen atorvastatin (LIPITOR) 80 MG tablet Take 1 tablet (80 mg total) by mouth daily at 6 PM. KEEP OV. 90 tablet 0  . metoprolol succinate (TOPROL-XL) 50 MG 24 hr tablet Take 1 tablet (50 mg total) by mouth daily. Take with or immediately following a meal. 90 tablet 0  . nicotine (NICODERM CQ) 14 mg/24hr patch Place 1 patch (14 mg total) onto the skin daily. 28 patch 0  . nicotine (NICODERM CQ) 7 mg/24hr patch Place 1 patch (7 mg total) onto the skin daily. 28 patch 0  . nicotine polacrilex (NICORETTE) 4 MG gum Take 1 each (4 mg total) by mouth as needed for smoking cessation. 100 tablet 0  . nitroGLYCERIN (NITROSTAT) 0.4 MG SL tablet Dissolve 1 tablet under the tongue every 5 minutes as  needed for chest pain 25 tablet 0   No current facility-administered medications for this visit.      Past Medical History:  Diagnosis Date  . CAD (coronary artery disease)    a. s/p MI @ age 89 with PCI of the RCA;  b. CTO of the LAD;  c. 11/2013 NSTEMI/PCI: LM nl, LAD 100 L->L collats, LCX 50p, OM2 80p, OM1 60ost, RCA 100p (3.5x38 Promus  DES), RPL 99/64m PTCA only, EF 40-50%;  c. 11/2013 Echo: EF 50-55%, Gr1 DD, mild MR.  Marland Kitchen HYPERLIPIDEMIA   . HYPERTENSION   . MITRAL REGURGITATION   . Tobacco abuse     Past Surgical History:  Procedure Laterality Date  . CORONARY STENT PLACEMENT    . LEFT HEART CATHETERIZATION WITH CORONARY ANGIOGRAM N/A 12/01/2013   Procedure: LEFT HEART CATHETERIZATION WITH CORONARY ANGIOGRAM;  Surgeon: Leonie Man, MD;  Location: [facility] CATH LAB;  Service: Cardiovascular;  Laterality: N/A;    Social History   Socioeconomic History  . Marital status: Divorced    Spouse name: Not on file  . Number of children: Not on file  . Years of education: Not on file  . Highest education level: Not on file  Occupational History  . Not on file  Social Needs  . Financial resource strain: Not on file  . Food insecurity:    Worry: Not on file    Inability: Not on file  . Transportation needs:    Medical: Not on file    Non-medical: Not on file  Tobacco Use  . Smoking status: Current Some Day Smoker    Types: Cigarettes  Substance and Sexual Activity  . Alcohol use: Yes  . Drug use: No  . Sexual activity: Yes  Lifestyle  . Physical  activity:    Days per week: Not on file    Minutes per session: Not on file  . Stress: Not on file  Relationships  . Social connections:    Talks on phone: Not on file    Gets together: Not on file    Attends religious service: Not on file    Active member of club or organization: Not on file    Attends meetings of clubs or organizations: Not on file    Relationship status: Not on file  . Intimate partner violence:    Fear of current or ex partner: Not on file    Emotionally abused: Not on file    Physically abused: Not on file    Forced sexual activity: Not on file  Other Topics Concern  . Not on file  Social History Narrative  . Not on file    Family History  Problem Relation Age of Onset  . Coronary artery disease Unknown     ROS: no fevers or chills,  productive cough, hemoptysis, dysphasia, odynophagia, melena, hematochezia, dysuria, hematuria, rash, seizure activity, orthopnea, PND, pedal edema, claudication. Remaining systems are negative.  Physical Exam: Well-developed well-nourished in no acute distress.  Skin is warm and dry.  HEENT is normal.  Neck is supple.  Chest is clear to auscultation with normal expansion.  Cardiovascular exam is regular rate and rhythm.  Abdominal exam nontender or distended. No masses palpated. Extremities show no edema. neuro grossly intact  ECG- personally reviewed  A/P  1  Kirk Ruths, MD

## 2018-03-02 ENCOUNTER — Ambulatory Visit: Payer: 59 | Admitting: Cardiology

## 2018-03-11 NOTE — Progress Notes (Signed)
HPI: FU coronary artery disease. The patient had an inferior myocardial infarction in 1999 at age 47. He had a stent to the right coronary artery at that time. Admitted with non-ST elevation myocardial infarction in January 2015. The LAD was occluded (chronic); the right coronary was also occluded. Ejection fraction 45-55%. The patient had PCI of his right coronary artery and PDA. Echocardiogram January 2015 showed ejection fraction 50-55% and grade 1 diastolic dysfunction. Mild mitral regurgitation. Since I last saw him, the patient denies any dyspnea on exertion, orthopnea, PND, pedal edema, palpitations, syncope or chest pain.   Current Outpatient Medications  Medication Sig Dispense Refill  . acetaminophen (TYLENOL) 325 MG tablet Take 2 tablets (650 mg total) by mouth every 4 (four) hours as needed for headache or mild pain. (Patient taking differently: Take 650 mg by mouth as needed for headache or mild pain. )    . aspirin 81 MG tablet Take 81 mg by mouth daily.     Marland Kitchen atorvastatin (LIPITOR) 80 MG tablet Take 1 tablet (80 mg total) by mouth daily at 6 PM. KEEP OV. 90 tablet 0  . metoprolol succinate (TOPROL-XL) 50 MG 24 hr tablet Take 1 tablet (50 mg total) by mouth daily. Take with or immediately following a meal. 90 tablet 0  . nitroGLYCERIN (NITROSTAT) 0.4 MG SL tablet Dissolve 1 tablet under the tongue every 5 minutes as  needed for chest pain 25 tablet 0   No current facility-administered medications for this visit.      Past Medical History:  Diagnosis Date  . CAD (coronary artery disease)    a. s/p MI @ age 84 with PCI of the RCA;  b. CTO of the LAD;  c. 11/2013 NSTEMI/PCI: LM nl, LAD 100 L->L collats, LCX 50p, OM2 80p, OM1 60ost, RCA 100p (3.5x38 Promus DES), RPL 99/27m PTCA only, EF 40-50%;  c. 11/2013 Echo: EF 50-55%, Gr1 DD, mild MR.  Marland Kitchen HYPERLIPIDEMIA   . HYPERTENSION   . MITRAL REGURGITATION   . Tobacco abuse     Past Surgical History:  Procedure Laterality Date  .  CORONARY STENT PLACEMENT    . LEFT HEART CATHETERIZATION WITH CORONARY ANGIOGRAM N/A 12/01/2013   Procedure: LEFT HEART CATHETERIZATION WITH CORONARY ANGIOGRAM;  Surgeon: Leonie Man, MD;  Location: [facility] CATH LAB;  Service: Cardiovascular;  Laterality: N/A;    Social History   Socioeconomic History  . Marital status: Divorced    Spouse name: Not on file  . Number of children: Not on file  . Years of education: Not on file  . Highest education level: Not on file  Occupational History  . Not on file  Social Needs  . Financial resource strain: Not on file  . Food insecurity:    Worry: Not on file    Inability: Not on file  . Transportation needs:    Medical: Not on file    Non-medical: Not on file  Tobacco Use  . Smoking status: Former Smoker    Types: Cigarettes  . Smokeless tobacco: Current User  Substance and Sexual Activity  . Alcohol use: Yes  . Drug use: No  . Sexual activity: Yes  Lifestyle  . Physical activity:    Days per week: Not on file    Minutes per session: Not on file  . Stress: Not on file  Relationships  . Social connections:    Talks on phone: Not on file    Gets together: Not on file  Attends religious service: Not on file    Active member of club or organization: Not on file    Attends meetings of clubs or organizations: Not on file    Relationship status: Not on file  . Intimate partner violence:    Fear of current or ex partner: Not on file    Emotionally abused: Not on file    Physically abused: Not on file    Forced sexual activity: Not on file  Other Topics Concern  . Not on file  Social History Narrative  . Not on file    Family History  Problem Relation Age of Onset  . Coronary artery disease Unknown     ROS: no fevers or chills, productive cough, hemoptysis, dysphasia, odynophagia, melena, hematochezia, dysuria, hematuria, rash, seizure activity, orthopnea, PND, pedal edema, claudication. Remaining systems are  negative.  Physical Exam: Well-developed well-nourished in no acute distress.  Skin is warm and dry.  HEENT is normal.  Neck is supple.  Chest is clear to auscultation with normal expansion.  Cardiovascular exam is regular rate and rhythm.  Abdominal exam nontender or distended. No masses palpated. Extremities show no edema. neuro grossly intact  ECG-sinus bradycardia at a rate of 51, inferior infarct.  Personally reviewed  A/P  1 coronary artery disease-patient denies chest pain.  Continue medical therapy including aspirin and statin.  2 hypertension-blood pressure is controlled this morning.  Continue present medications and follow.    3 hyperlipidemia-continue statin.  Patient had blood work drawn in October which was personally reviewed today.  Total cholesterol 176, LDL 105.  Liver function is normal.  Add Zetia 10 mg daily.  Check lipids and liver in 4 weeks.  4 tobacco abuse-patient has discontinued.  Kirk Ruths, MD

## 2018-03-17 ENCOUNTER — Ambulatory Visit: Payer: 59 | Admitting: Cardiology

## 2018-03-17 ENCOUNTER — Encounter: Payer: Self-pay | Admitting: Cardiology

## 2018-03-17 VITALS — BP 114/80 | HR 51 | Ht 68.0 in | Wt 172.0 lb

## 2018-03-17 DIAGNOSIS — I2510 Atherosclerotic heart disease of native coronary artery without angina pectoris: Secondary | ICD-10-CM | POA: Diagnosis not present

## 2018-03-17 DIAGNOSIS — I10 Essential (primary) hypertension: Secondary | ICD-10-CM | POA: Diagnosis not present

## 2018-03-17 DIAGNOSIS — E7800 Pure hypercholesterolemia, unspecified: Secondary | ICD-10-CM | POA: Diagnosis not present

## 2018-03-17 MED ORDER — EZETIMIBE 10 MG PO TABS: 10.0000 mg | ORAL_TABLET | Freq: Every day | ORAL | 3 refills | Status: DC

## 2018-03-17 NOTE — Patient Instructions (Signed)
Medication Instructions:   START EZETIMIBE 10 MG ONCE DAILY  Labwork:  Your physician recommends that you return for lab work in: [place]:  Your physician wants you to follow-up in: [place] will receive a reminder letter in the mail two months in advance. If you don't receive a letter, please call our office to schedule the follow-up appointment.   If you need a refill on your cardiac medications before your next appointment, please call your pharmacy.

## 2018-04-24 ENCOUNTER — Other Ambulatory Visit: Payer: Self-pay | Admitting: Cardiology

## 2018-04-24 DIAGNOSIS — I2510 Atherosclerotic heart disease of native coronary artery without angina pectoris: Secondary | ICD-10-CM

## 2018-04-24 NOTE — Telephone Encounter (Signed)
Rx request sent to pharmacy.  

## 2018-05-07 ENCOUNTER — Encounter: Payer: Self-pay | Admitting: *Deleted

## 2018-12-01 ENCOUNTER — Other Ambulatory Visit: Payer: Self-pay | Admitting: Cardiology

## 2018-12-01 DIAGNOSIS — I2510 Atherosclerotic heart disease of native coronary artery without angina pectoris: Secondary | ICD-10-CM

## 2018-12-04 LAB — HEPATIC FUNCTION PANEL
ALBUMIN: 4.2 g/dL (ref 4.0–5.0)
ALK PHOS: 78 IU/L (ref 39–117)
ALT: 22 IU/L (ref 0–44)
AST: 21 IU/L (ref 0–40)
BILIRUBIN, DIRECT: 0.13 mg/dL (ref 0.00–0.40)
Bilirubin Total: 0.5 mg/dL (ref 0.0–1.2)
TOTAL PROTEIN: 6.3 g/dL (ref 6.0–8.5)

## 2018-12-04 LAB — LIPID PANEL
CHOL/HDL RATIO: 6.3 ratio — AB (ref 0.0–5.0)
Cholesterol, Total: 238 mg/dL — ABNORMAL HIGH (ref 100–199)
HDL: 38 mg/dL — ABNORMAL LOW (ref >39)
LDL Calculated: 175 mg/dL — ABNORMAL HIGH (ref 0–99)
Triglycerides: 127 mg/dL (ref 0–149)
VLDL Cholesterol Cal: 25 mg/dL (ref 5–40)

## 2018-12-08 ENCOUNTER — Telehealth: Payer: Self-pay | Admitting: *Deleted

## 2018-12-08 DIAGNOSIS — E7800 Pure hypercholesterolemia, unspecified: Secondary | ICD-10-CM

## 2018-12-08 MED ORDER — ATORVASTATIN CALCIUM 80 MG PO TABS: 80.0000 mg | ORAL_TABLET | Freq: Every day | ORAL | 0 refills | Status: DC

## 2018-12-08 NOTE — Telephone Encounter (Signed)
-----   Message from Lelon Perla, MD sent at 12/04/2018  4:41 PM EST ----- Needs to resume lipitor 80 mg daily and zetia 10 mg daily if not taking; otherwise refer to lipid clinic Kirk Ruths

## 2018-12-08 NOTE — Telephone Encounter (Signed)
Spoke with pt, he has been taking the zetia bit not the lipitor. New script sent to the pharmacy and Lab orders mailed to the pt

## 2019-03-02 ENCOUNTER — Encounter: Payer: Self-pay | Admitting: Cardiology

## 2019-03-02 NOTE — Telephone Encounter (Signed)
New Message   Patient is returning call in reference to his 5/1 appt. Please call.

## 2019-03-02 NOTE — Telephone Encounter (Signed)
This encounter was created in error - please disregard.

## 2019-03-04 NOTE — Progress Notes (Signed)
Virtual Visit via Video Note   This visit type was conducted due to national recommendations for restrictions regarding the COVID-19 Pandemic (e.g. social distancing) in an effort to limit this patient's exposure and mitigate transmission in our community.  Due to his co-morbid illnesses, this patient is at least at moderate risk for complications without adequate follow up.  This format is felt to be most appropriate for this patient at this time.  All issues noted in this document were discussed and addressed.  A limited physical exam was performed with this format.  Please refer to the patient's chart for his consent to telehealth for [facility].   Evaluation Performed:  Follow-up visit  Date:  03/12/2019   ID:  Austin Vincent, DOB 1970-12-26, MRN [id number]  Patient Location: Home Provider Location: Home  PCP:  Alroy Dust, L.Marlou Sa, MD  Cardiologist:  Dr Stanford Breed  Chief Complaint:  FU CAD  History of Present Illness:    FU coronary artery disease. The patient had an inferior myocardial infarction in 1999 at age 31. He had a stent to the right coronary artery at that time. Admitted with non-ST elevation myocardial infarction in January 2015. The LAD was occluded (chronic); the right coronary was also occluded. Ejection fraction 45-55%. The patient had PCI of his right coronary artery and PDA. Echocardiogram January 2015 showed ejection fraction 50-55% and grade 1 diastolic dysfunction. Mild mitral regurgitation. Since I last saw him, the patient denies any dyspnea on exertion, orthopnea, PND, pedal edema, palpitations, syncope or chest pain.   The patient does not have symptoms concerning for COVID-19 infection (fever, chills, cough, or new shortness of breath).    Past Medical History:  Diagnosis Date  . CAD (coronary artery disease)    a. s/p MI @ age 69 with PCI of the RCA;  b. CTO of the LAD;  c. 11/2013 NSTEMI/PCI: LM nl, LAD 100 L->L collats, LCX 50p, OM2 80p, OM1 60ost, RCA  100p (3.5x38 Promus DES), RPL 99/52m PTCA only, EF 40-50%;  c. 11/2013 Echo: EF 50-55%, Gr1 DD, mild MR.  Marland Kitchen HYPERLIPIDEMIA   . HYPERTENSION   . MITRAL REGURGITATION   . Tobacco abuse    Past Surgical History:  Procedure Laterality Date  . CORONARY STENT PLACEMENT    . LEFT HEART CATHETERIZATION WITH CORONARY ANGIOGRAM N/A 12/01/2013   Procedure: LEFT HEART CATHETERIZATION WITH CORONARY ANGIOGRAM;  Surgeon: Leonie Man, MD;  [facility] LAB;  Service: Cardiovascular;  Laterality: N/A;     Current Meds  Medication Sig  . aspirin 81 MG tablet Take 81 mg by mouth daily.   Marland Kitchen atorvastatin (LIPITOR) 80 MG tablet Take 1 tablet (80 mg total) by mouth daily at 6 PM. KEEP OV.  . ezetimibe (ZETIA) 10 MG tablet Take 1 tablet (10 mg total) by mouth daily.  . metoprolol succinate (TOPROL-XL) 50 MG 24 hr tablet TAKE 1 TABLET DAILY WITH ORIMMEDIATELY FOLLOWING A    MEAL, KEEP OFFICE VISIT  . nitroGLYCERIN (NITROSTAT) 0.4 MG SL tablet Dissolve 1 tablet under the tongue every 5 minutes as  needed for chest pain     Allergies:   Patient has no known allergies.   Social History   Tobacco Use  . Smoking status: Former Smoker    Types: Cigarettes  . Smokeless tobacco: Current User  Substance Use Topics  . Alcohol use: Yes  . Drug use: No     Family Hx: The patient's family history includes Coronary artery disease in his  unknown relative.  ROS:   Please see the history of present illness.    No fevers, chills or productive cough. All other systems reviewed and are negative.   Recent Labs: 12/04/2018: ALT 22   Recent Lipid Panel Lab Results  Component Value Date/Time   CHOL 238 (H) 12/04/2018 08:08 AM   TRIG 127 12/04/2018 08:08 AM   HDL 38 (L) 12/04/2018 08:08 AM   CHOLHDL 6.3 (H) 12/04/2018 08:08 AM   CHOLHDL 3 03/16/2014 07:44 AM   LDLCALC 175 (H) 12/04/2018 08:08 AM   LDLDIRECT 190.7 03/24/2013 07:52 AM    Wt Readings from Last 3 Encounters:  03/12/19 160 lb (72.6 kg)   03/17/18 172 lb (78 kg)  09/05/15 159 lb 12.8 oz (72.5 kg)     Objective:    Vital Signs:  BP 117/80   Pulse (!) 53   Wt 160 lb (72.6 kg)   BMI 24.33 kg/m    VITAL SIGNS:  reviewed  No acute distress Answers questions appropriately Normal affect Remainder of physical exam not performed (telehealth visit; coronavirus pandemic)  ASSESSMENT & PLAN:    1. Coronary artery disease-patient has not had recurrent chest pain.  Continue medical therapy with aspirin and statin. 2. Hypertension-patient's blood pressure is controlled.  Continue present medications. 3. Hyperlipidemia-continue statin.  LDL recently elevated but patient was not taking his statin.  He has now resumed and is also on Zetia.  He will return for fasting lipids and liver in 6 weeks.  COVID-19 Education: The importance of social distancing was discussed today.  Time:   Today, I have spent 15 minutes with the patient with telehealth technology discussing the above problems.     Medication Adjustments/Labs and Tests Ordered: Current medicines are reviewed at length with the patient today.  Concerns regarding medicines are outlined above.   Tests Ordered: No orders of the defined types were placed in this encounter.   Medication Changes: No orders of the defined types were placed in this encounter.   Disposition:  Follow up in 6 month(s)  Signed, Kirk Ruths, MD  03/12/2019 8:16 AM    [facility]

## 2019-03-11 ENCOUNTER — Telehealth: Payer: Self-pay | Admitting: Cardiology

## 2019-03-11 NOTE — Telephone Encounter (Signed)
Called x3 for pre reg/LVM °

## 2019-03-12 ENCOUNTER — Telehealth (INDEPENDENT_AMBULATORY_CARE_PROVIDER_SITE_OTHER): Payer: 59 | Admitting: Cardiology

## 2019-03-12 ENCOUNTER — Telehealth: Payer: Self-pay

## 2019-03-12 ENCOUNTER — Encounter: Payer: Self-pay | Admitting: Cardiology

## 2019-03-12 VITALS — BP 117/80 | HR 53 | Wt 160.0 lb

## 2019-03-12 DIAGNOSIS — E7800 Pure hypercholesterolemia, unspecified: Secondary | ICD-10-CM

## 2019-03-12 DIAGNOSIS — I2510 Atherosclerotic heart disease of native coronary artery without angina pectoris: Secondary | ICD-10-CM

## 2019-03-12 DIAGNOSIS — I10 Essential (primary) hypertension: Secondary | ICD-10-CM

## 2019-03-12 NOTE — Patient Instructions (Signed)
Medication Instructions:  NO CHANGE If you need a refill on your cardiac medications before your next appointment, please call your pharmacy.   Lab work: Your physician recommends that you return for lab work in: [place] If you have labs (blood work) drawn today and your tests are completely normal, you will receive your results only by: Marland Kitchen MyChart Message (if you have MyChart) OR . A paper copy in the mail If you have any lab test that is abnormal or we need to change your treatment, we will call you to review the results.  Follow-Up: At [facility], you and your health needs are our priority.  As part of our continuing mission to provide you with exceptional heart care, we have created designated Provider Care Teams.  These Care Teams include your primary Cardiologist (physician) and Advanced Practice Providers (APPs -  Physician Assistants and Nurse Practitioners) who all work together to provide you with the care you need, when you need it. You will need a follow up appointment in 6 months.  Please call our office 2 months in advance to schedule this appointment.  You may see Kirk Ruths MD or one of the following Advanced Practice Providers on your designated Care Team:   Kerin Ransom, PA-C Roby Lofts, [place] . Sande Rives, PA-C

## 2019-03-12 NOTE — Telephone Encounter (Signed)
Called pt for pre chart before 8:00 appt with Dr. Stanford Breed and no one answered. I left message for patient to call back

## 2019-06-17 ENCOUNTER — Other Ambulatory Visit: Payer: Self-pay | Admitting: Cardiology

## 2019-06-17 DIAGNOSIS — E7800 Pure hypercholesterolemia, unspecified: Secondary | ICD-10-CM

## 2019-06-17 DIAGNOSIS — I2510 Atherosclerotic heart disease of native coronary artery without angina pectoris: Secondary | ICD-10-CM

## 2019-08-12 ENCOUNTER — Encounter: Payer: Self-pay | Admitting: *Deleted

## 2019-09-02 ENCOUNTER — Other Ambulatory Visit: Payer: Self-pay | Admitting: Cardiology

## 2019-09-02 DIAGNOSIS — I2510 Atherosclerotic heart disease of native coronary artery without angina pectoris: Secondary | ICD-10-CM

## 2020-02-29 ENCOUNTER — Other Ambulatory Visit: Payer: Self-pay | Admitting: Cardiology

## 2020-02-29 DIAGNOSIS — I2510 Atherosclerotic heart disease of native coronary artery without angina pectoris: Secondary | ICD-10-CM

## 2020-03-30 ENCOUNTER — Other Ambulatory Visit: Payer: Self-pay | Admitting: Cardiology

## 2020-03-30 DIAGNOSIS — I2510 Atherosclerotic heart disease of native coronary artery without angina pectoris: Secondary | ICD-10-CM

## 2020-04-19 ENCOUNTER — Encounter: Payer: Self-pay | Admitting: General Practice

## 2020-06-26 ENCOUNTER — Other Ambulatory Visit: Payer: Self-pay | Admitting: Cardiology

## 2020-06-26 DIAGNOSIS — E7800 Pure hypercholesterolemia, unspecified: Secondary | ICD-10-CM

## 2020-07-13 NOTE — Progress Notes (Signed)
HPI: FU coronary artery disease. The patient had an inferior myocardial infarction in 1999 at age 49. He had a stent to the right coronary artery at that time. Admitted with non-ST elevation myocardial infarction in January 2015. The LAD was occluded (chronic); the right coronary was also occluded. Ejection fraction 45-55%. The patient had PCI of his right coronary artery and PDA. Echocardiogram January 2015 showed ejection fraction 50-55% and grade 1 diastolic dysfunction. Mild mitral regurgitation. Since I last saw him,patient denies dyspnea, chest pain, palpitations or syncope.  He is exercising routinely.  He is not taking his medications daily but is taking them once every 3 days.  Current Outpatient Medications  Medication Sig Dispense Refill  . aspirin 81 MG tablet Take 81 mg by mouth daily.     Marland Kitchen atorvastatin (LIPITOR) 80 MG tablet TAKE 1 TABLET DAILY AT 6:00PM 90 tablet 2  . ezetimibe (ZETIA) 10 MG tablet TAKE 1 TABLET DAILY 90 tablet 3  . metoprolol succinate (TOPROL-XL) 50 MG 24 hr tablet Take 1 tablet (50 mg total) by mouth daily. Please schedule annual appt with Dr. Stanford Breed for May for further refills. [phone]. 30 tablet 0  . nitroGLYCERIN (NITROSTAT) 0.4 MG SL tablet Dissolve 1 tablet under the tongue every 5 minutes as  needed for chest pain 25 tablet 0   No current facility-administered medications for this visit.     Past Medical History:  Diagnosis Date  . CAD (coronary artery disease)    a. s/p MI @ age 7 with PCI of the RCA;  b. CTO of the LAD;  c. 11/2013 NSTEMI/PCI: LM nl, LAD 100 L->L collats, LCX 50p, OM2 80p, OM1 60ost, RCA 100p (3.5x38 Promus DES), RPL 99/44m PTCA only, EF 40-50%;  c. 11/2013 Echo: EF 50-55%, Gr1 DD, mild MR.  Marland Kitchen HYPERLIPIDEMIA   . HYPERTENSION   . MITRAL REGURGITATION   . Tobacco abuse     Past Surgical History:  Procedure Laterality Date  . CORONARY STENT PLACEMENT    . LEFT HEART CATHETERIZATION WITH CORONARY ANGIOGRAM N/A  12/01/2013   Procedure: LEFT HEART CATHETERIZATION WITH CORONARY ANGIOGRAM;  Surgeon: Leonie Man, MD;  Location: [facility] CATH LAB;  Service: Cardiovascular;  Laterality: N/A;    Social History   Socioeconomic History  . Marital status: Divorced    Spouse name: Not on file  . Number of children: Not on file  . Years of education: Not on file  . Highest education level: Not on file  Occupational History  . Not on file  Tobacco Use  . Smoking status: Former Smoker    Types: Cigarettes  . Smokeless tobacco: Current User  Substance and Sexual Activity  . Alcohol use: Yes  . Drug use: No  . Sexual activity: Yes  Other Topics Concern  . Not on file  Social History Narrative  . Not on file   Social Determinants of Health   Financial Resource Strain:   . Difficulty of Paying Living Expenses: Not on file  Food Insecurity:   . Worried About Charity fundraiser in the Last Year: Not on file  . Ran Out of Food in the Last Year: Not on file  Transportation Needs:   . Lack of Transportation (Medical): Not on file  . Lack of Transportation (Non-Medical): Not on file  Physical Activity:   . Days of Exercise per Week: Not on file  . Minutes of Exercise per Session: Not on file  Stress:   .  Feeling of Stress : Not on file  Social Connections:   . Frequency of Communication with Friends and Family: Not on file  . Frequency of Social Gatherings with Friends and Family: Not on file  . Attends Religious Services: Not on file  . Active Member of Clubs or Organizations: Not on file  . Attends Archivist Meetings: Not on file  . Marital Status: Not on file  Intimate Partner Violence:   . Fear of Current or Ex-Partner: Not on file  . Emotionally Abused: Not on file  . Physically Abused: Not on file  . Sexually Abused: Not on file    Family History  Problem Relation Age of Onset  . Coronary artery disease Unknown     ROS: no fevers or chills, productive cough, hemoptysis,  dysphasia, odynophagia, melena, hematochezia, dysuria, hematuria, rash, seizure activity, orthopnea, PND, pedal edema, claudication. Remaining systems are negative.  Physical Exam: Well-developed well-nourished in no acute distress.  Skin is warm and dry.  HEENT is normal.  Neck is supple.  Chest is clear to auscultation with normal expansion.  Cardiovascular exam is regular rate and rhythm.  Abdominal exam nontender or distended. No masses palpated. Extremities show no edema. neuro grossly intact  ECG-sinus bradycardia at a rate of 59, nonspecific ST changes, inferior infarct.  Personally reviewed  A/P  1 coronary artery disease-patient denies chest pain.  Plan to continue medical therapy.  I have asked him to take his aspirin 81 mg daily as well as a statin daily.  2 hypertension-blood pressure mildly elevated but he has not taken his medications and also states his pressure is running low at home.  I have asked him to take Toprol 25 mg daily and we will adjust regimen as needed.  3 hyperlipidemia-he is having some myalgias.  I have asked him to take Lipitor 40 mg daily in addition to his Zetia 10 mg daily.  Check lipids and liver in 12 weeks.  If he continues to have myalgias then we will need to consider a different statin versus referral for Repatha.  4 tobacco abuse-patient counseled on discontinuing.  Kirk Ruths, MD

## 2020-07-24 ENCOUNTER — Ambulatory Visit (INDEPENDENT_AMBULATORY_CARE_PROVIDER_SITE_OTHER): Payer: Commercial Managed Care - PPO | Admitting: Cardiology

## 2020-07-24 ENCOUNTER — Other Ambulatory Visit: Payer: Self-pay

## 2020-07-24 ENCOUNTER — Encounter: Payer: Self-pay | Admitting: Cardiology

## 2020-07-24 VITALS — BP 146/92 | HR 59 | Ht 68.0 in | Wt 155.0 lb

## 2020-07-24 DIAGNOSIS — Z5181 Encounter for therapeutic drug level monitoring: Secondary | ICD-10-CM | POA: Diagnosis not present

## 2020-07-24 DIAGNOSIS — E7800 Pure hypercholesterolemia, unspecified: Secondary | ICD-10-CM

## 2020-07-24 DIAGNOSIS — I10 Essential (primary) hypertension: Secondary | ICD-10-CM

## 2020-07-24 DIAGNOSIS — I2510 Atherosclerotic heart disease of native coronary artery without angina pectoris: Secondary | ICD-10-CM

## 2020-07-24 MED ORDER — METOPROLOL SUCCINATE ER 25 MG PO TB24: 25.0000 mg | ORAL_TABLET | Freq: Every day | ORAL | 3 refills | Status: DC

## 2020-07-24 MED ORDER — EZETIMIBE 10 MG PO TABS: 10.0000 mg | ORAL_TABLET | Freq: Every day | ORAL | 3 refills | Status: DC

## 2020-07-24 MED ORDER — ATORVASTATIN CALCIUM 40 MG PO TABS: 40.0000 mg | ORAL_TABLET | Freq: Every day | ORAL | 3 refills | Status: DC

## 2020-07-24 NOTE — Patient Instructions (Addendum)
Medication Instructions:  TAKE ASPIRIN 81 MG DAILY   TAKE ATORVASTATIN TO 40 MG DAILY (1/2 OF THE 80 MG TABLET UNTIL FINISHED)   TAKE METOPROLOL 25 MG DAILY   TAKE  ZETIA 10 MG DAILY  *If you need a refill on your cardiac medications before your next appointment, please call your pharmacy*  Lab Work: FASTING LP/BMET/HFP IN 38 WEEKS  If you have labs (blood work) drawn today and your tests are completely normal, you will receive your results only by: Marland Kitchen MyChart Message (if you have MyChart) OR . A paper copy in the mail If you have any lab test that is abnormal or we need to change your treatment, we will call you to review the results.   Testing/Procedures: NONE  Follow-Up: At [facility], you and your health needs are our priority.  As part of our continuing mission to provide you with exceptional heart care, we have created designated Provider Care Teams.  These Care Teams include your primary Cardiologist (physician) and Advanced Practice Providers (APPs -  Physician Assistants and Nurse Practitioners) who all work together to provide you with the care you need, when you need it.  We recommend signing up for the patient portal called "MyChart".  Sign up information is provided on this After Visit Summary.  MyChart is used to connect with patients for Virtual Visits (Telemedicine).  Patients are able to view lab/test results, encounter notes, upcoming appointments, etc.  Non-urgent messages can be sent to your provider as well.   To learn more about what you can do with MyChart, go to [email or web address].    Your next appointment:   12 month(s) You will receive a reminder letter in the mail two months in advance. If you don't receive a letter, please call our office to schedule the follow-up appointment.  The format for your next appointment:   In Person  Provider:   You may see DR Stanford Breed  or one of the following Advanced Practice Providers on your designated Care Team:     Kerin Ransom, PA-C  [place], [place]  Coletta Memos, [place]

## 2020-10-31 LAB — BASIC METABOLIC PANEL
BUN/Creatinine Ratio: 13 (ref 9–20)
BUN: 12 mg/dL (ref 6–24)
CO2: 24 mmol/L (ref 20–29)
Calcium: 9.5 mg/dL (ref 8.7–10.2)
Chloride: 103 mmol/L (ref 96–106)
Creatinine, Ser: 0.93 mg/dL (ref 0.76–1.27)
GFR calc Af Amer: 109 mL/min/{1.73_m2} (ref >59)
GFR calc non Af Amer: 95 mL/min/{1.73_m2} (ref >59)
Glucose: 95 mg/dL (ref 65–99)
Potassium: 4.8 mmol/L (ref 3.5–5.2)
Sodium: 140 mmol/L (ref 134–144)

## 2020-10-31 LAB — HEPATIC FUNCTION PANEL
ALT: 23 IU/L (ref 0–44)
AST: 23 IU/L (ref 0–40)
Albumin: 4.6 g/dL (ref 3.8–4.9)
Alkaline Phosphatase: 87 IU/L (ref 44–121)
Bilirubin Total: 0.5 mg/dL (ref 0.0–1.2)
Bilirubin, Direct: 0.15 mg/dL (ref 0.00–0.40)
Total Protein: 7 g/dL (ref 6.0–8.5)

## 2020-10-31 LAB — LIPID PANEL
Chol/HDL Ratio: 3.1 ratio (ref 0.0–5.0)
Cholesterol, Total: 136 mg/dL (ref 100–199)
HDL: 44 mg/dL (ref >39)
LDL Chol Calc (NIH): 74 mg/dL (ref 0–99)
Triglycerides: 95 mg/dL (ref 0–149)
VLDL Cholesterol Cal: 18 mg/dL (ref 5–40)

## 2020-11-02 ENCOUNTER — Telehealth: Payer: Self-pay | Admitting: Cardiology

## 2020-11-02 NOTE — Telephone Encounter (Signed)
Spoke with patient regarding the following results. Patient made aware and patient verbalized understanding.  Recent Lab work-  Lelon Perla, MD  11/01/2020 3:58 PM EST      No change in meds Kirk Ruths

## 2020-11-02 NOTE — Telephone Encounter (Signed)
Danzel is returning [employer]. Please advise.

## 2021-08-24 DIAGNOSIS — Z23 Encounter for immunization: Secondary | ICD-10-CM | POA: Diagnosis not present

## 2021-11-11 DIAGNOSIS — C801 Malignant (primary) neoplasm, unspecified: Secondary | ICD-10-CM

## 2021-11-11 DIAGNOSIS — N200 Calculus of kidney: Secondary | ICD-10-CM

## 2021-11-11 HISTORY — DX: Malignant (primary) neoplasm, unspecified: C80.1

## 2021-11-11 HISTORY — DX: Calculus of kidney: N20.0

## 2021-11-18 DIAGNOSIS — R1032 Left lower quadrant pain: Secondary | ICD-10-CM | POA: Diagnosis not present

## 2021-11-18 DIAGNOSIS — R3129 Other microscopic hematuria: Secondary | ICD-10-CM | POA: Diagnosis not present

## 2021-11-18 DIAGNOSIS — N200 Calculus of kidney: Secondary | ICD-10-CM | POA: Diagnosis not present

## 2021-12-26 ENCOUNTER — Ambulatory Visit: Payer: BC Managed Care – PPO | Admitting: Family Medicine

## 2021-12-26 ENCOUNTER — Encounter: Payer: Self-pay | Admitting: Family Medicine

## 2021-12-26 ENCOUNTER — Other Ambulatory Visit: Payer: Self-pay

## 2021-12-26 VITALS — BP 132/78 | HR 54 | Temp 97.6°F | Ht 68.0 in | Wt 167.4 lb

## 2021-12-26 DIAGNOSIS — M25512 Pain in left shoulder: Secondary | ICD-10-CM | POA: Diagnosis not present

## 2021-12-26 DIAGNOSIS — Z23 Encounter for immunization: Secondary | ICD-10-CM | POA: Diagnosis not present

## 2021-12-26 DIAGNOSIS — I2510 Atherosclerotic heart disease of native coronary artery without angina pectoris: Secondary | ICD-10-CM | POA: Diagnosis not present

## 2021-12-26 DIAGNOSIS — Z87448 Personal history of other diseases of urinary system: Secondary | ICD-10-CM

## 2021-12-26 DIAGNOSIS — E785 Hyperlipidemia, unspecified: Secondary | ICD-10-CM | POA: Diagnosis not present

## 2021-12-26 DIAGNOSIS — I10 Essential (primary) hypertension: Secondary | ICD-10-CM | POA: Diagnosis not present

## 2021-12-26 DIAGNOSIS — Z1211 Encounter for screening for malignant neoplasm of colon: Secondary | ICD-10-CM

## 2021-12-26 DIAGNOSIS — Z87891 Personal history of nicotine dependence: Secondary | ICD-10-CM

## 2021-12-26 DIAGNOSIS — F17201 Nicotine dependence, unspecified, in remission: Secondary | ICD-10-CM | POA: Insufficient documentation

## 2021-12-26 NOTE — Progress Notes (Signed)
[facility] [address] [place] [address] Dept: [phone] Dept Fax: [phone]  New Patient Office Visit  Subjective:    Patient ID: Gerda Diss Shader, male    DOB: Nov 26, 1970, 51 y.o..   MRN: [id number]  Chief Complaint  Patient presents with   Establish Care    NP- establish care.  C/o having throat tightness with exercise/sex along with HA.       History of Present Illness:  Patient is in today to establish care. Mr. Kerrigan was born in [place], [place]. He attended [employer] in [employer]. He currently is a Hospital doctor for [employer]. Mr. Masso was divorced in 2011. He has an SO who he lives with. He has two sons (63, 50). He started smoking at age 68. He smoked ~ 1 ppd up until 2 years ago, when he quit. He transitioned to vaping, but stopped this a month ago. He is currently using nicotine pouches (about 10/day). He does plan to eventually quit this. He drinks alcohol occasionally. He uses CBD and marijuana, smoking joints about 3 times a week.  Mr. Frye has a history of a NSTEMI. He had PCI in 1999 (age 72) and a DES in 2015. He is currently managed on aspirin and metoprolol. His last visit with cardiology has been 18 months ago. Mr. Keadle dopes note that in the past year, he has had several episodes of a choking sensation that occurred during sexual intercourse. This resolved afterwards with rest. On occasion, he noted some need to belch that also helped. He also complains of some tightness at times int he left arm, though he feels this may be more of an issue with trigger points and wants to know about dry needling of these. He has hyperlipidemia and is managed on atorvastatin and ezetimibe.  Mr. Tubbs notes that about a month ago, he had acute onset of lower left abdominal pain. He was seen in Urgent Care and treated with antibiotics for a possible  diverticulitis. However, he also has a history of having had 2 prior kidney stones and he did have some microscopic hematuria that day. He was given antibiotics, but also placed on a course of Flomax. The pain has since resolved.  Past Medical History: Patient Active Problem List   Diagnosis Date Noted   History of tobacco abuse 12/26/2021   History of non-ST elevation myocardial infarction (NSTEMI) 12/01/2013   Nonspecific (abnormal) findings on radiological and other examination of body structure 12/04/2009   Hyperlipidemia 11/28/2009   Mitral regurgitation 11/28/2009   Essential hypertension 11/28/2009   Coronary artery disease involving native coronary artery of native heart without angina pectoris 11/28/2009   Past Surgical History:  Procedure Laterality Date   CORONARY STENT PLACEMENT     LEFT HEART CATHETERIZATION WITH CORONARY ANGIOGRAM N/A 12/01/2013   Procedure: LEFT HEART CATHETERIZATION WITH CORONARY ANGIOGRAM;  Surgeon: Leonie Man, MD;  Location: [facility] CATH LAB;  Service: Cardiovascular;  Laterality: N/A;   Family History  Problem Relation Age of Onset   Cancer Mother        lung   Heart disease Mother    Hypertension Mother    COPD Mother    Cancer Father        skin, prostrate   Diabetes Father        Type 1   Heart disease Father    Hypertension Father    Cancer Sister  Skin   Coronary artery disease Other    Outpatient Medications Prior to Visit  Medication Sig Dispense Refill   aspirin 81 MG tablet Take 81 mg by mouth daily.      atorvastatin (LIPITOR) 40 MG tablet Take 1 tablet (40 mg total) by mouth daily. 90 tablet 3   ezetimibe (ZETIA) 10 MG tablet Take 1 tablet (10 mg total) by mouth daily. 90 tablet 3   metoprolol succinate (TOPROL-XL) 25 MG 24 hr tablet Take 1 tablet (25 mg total) by mouth daily. 90 tablet 3   nitroGLYCERIN (NITROSTAT) 0.4 MG SL tablet Dissolve 1 tablet under the tongue every 5 minutes as  needed for chest pain 25 tablet 0    No facility-administered medications prior to visit.   No Known Allergies    Objective:   Today's Vitals   12/26/21 1059  BP: 132/78  Pulse: (!) 54  Temp: 97.6 F (36.4 C)  TempSrc: Temporal  SpO2: 97%  Weight: 167 lb 6.4 oz (75.9 kg)  Height: 5\' 8"  (1.727 m)   Body mass index is 25.45 kg/m.   General: Well developed, well nourished. No acute distress. CV: RRR without murmurs or rubs. Pulses 2+ bilaterally. Psych: Alert and oriented. Normal mood and affect.  Health Maintenance Due  Topic Date Due   HIV Screening  Never done   Hepatitis C Screening  Never done   COLONOSCOPY (Pts 45-59yrs Insurance coverage will need to be confirmed)  Never done   Zoster Vaccines- Shingrix (1 of 2) Never done   Lab Results Last lipids Lab Results  Component Value Date   CHOL 136 10/31/2020   HDL 44 10/31/2020   LDLCALC 74 10/31/2020   LDLDIRECT 190.7 03/24/2013   TRIG 95 10/31/2020   CHOLHDL 3.1 10/31/2020   Assessment & Plan:   1. Coronary artery disease involving native coronary artery of native heart without angina pectoris Currently managed on aspirin and metoprolol. The recent throat and left arm issues are worrisome for possible anginal symptoms. By history, he is at very high-risk related to his CVD. I recommend he contact cardiology to follow-up.  2. Essential hypertension Blood pressure is adequately managed on metoprolol. I will check his renal function for annual monitoring.  - Basic metabolic panel; Future  3. Hyperlipidemia, unspecified hyperlipidemia type Due for repeat lipids. This has been much better controlled on atorvastatin and ezetimibe.  - Lipid panel; Future  4. History of hematuria I will repeat his UA to see if any hematuria persists.  - Urinalysis w microscopic + reflex cultur; Future  5. History of tobacco abuse Patient meets criteria for lung cancer screening (> 20 pack-year history fo smoking, age > 82, quit within past 15 years). I did  encourage him to continue effort to stop nicotine use.  - CT CHEST LUNG CA SCREEN LOW DOSE W/O CM; Future  6. Need for Tdap vaccination  - Tdap vaccine greater than or equal to 7yo IM  7. Screening for colon cancer  - Ambulatory referral to Gastroenterology  Haydee Salter, MD

## 2021-12-31 ENCOUNTER — Other Ambulatory Visit: Payer: Self-pay

## 2021-12-31 ENCOUNTER — Other Ambulatory Visit (INDEPENDENT_AMBULATORY_CARE_PROVIDER_SITE_OTHER): Payer: BC Managed Care – PPO

## 2021-12-31 DIAGNOSIS — I10 Essential (primary) hypertension: Secondary | ICD-10-CM | POA: Diagnosis not present

## 2021-12-31 DIAGNOSIS — E785 Hyperlipidemia, unspecified: Secondary | ICD-10-CM

## 2021-12-31 DIAGNOSIS — R1032 Left lower quadrant pain: Secondary | ICD-10-CM

## 2021-12-31 DIAGNOSIS — Z87448 Personal history of other diseases of urinary system: Secondary | ICD-10-CM

## 2021-12-31 LAB — BASIC METABOLIC PANEL
BUN: 20 mg/dL (ref 6–23)
CO2: 31 mEq/L (ref 19–32)
Calcium: 9.4 mg/dL (ref 8.4–10.5)
Chloride: 103 mEq/L (ref 96–112)
Creatinine, Ser: 1.04 mg/dL (ref 0.40–1.50)
GFR: 82.25 mL/min (ref >60.00)
Glucose, Bld: 107 mg/dL — ABNORMAL HIGH (ref 70–99)
Potassium: 4.2 mEq/L (ref 3.5–5.1)
Sodium: 137 mEq/L (ref 135–145)

## 2021-12-31 LAB — LIPID PANEL
Cholesterol: 273 mg/dL — ABNORMAL HIGH (ref 0–200)
HDL: 54.1 mg/dL (ref >39.00)
LDL Cholesterol: 195 mg/dL — ABNORMAL HIGH (ref 0–99)
NonHDL: 218.97
Total CHOL/HDL Ratio: 5
Triglycerides: 121 mg/dL (ref 0.0–149.0)
VLDL: 24.2 mg/dL (ref 0.0–40.0)

## 2021-12-31 MED ORDER — ATORVASTATIN CALCIUM 80 MG PO TABS: 40.0000 mg | ORAL_TABLET | Freq: Every day | ORAL | 3 refills | Status: DC

## 2021-12-31 NOTE — Progress Notes (Signed)
Lab Results  Component Value Date   CHOL 273 (H) 12/31/2021   HDL 54.10 12/31/2021   LDLCALC 195 (H) 12/31/2021   LDLDIRECT 190.7 03/24/2013   TRIG 121.0 12/31/2021   CHOLHDL 5 12/31/2021   1. Hyperlipidemia, unspecified hyperlipidemia type Nursing spoke with patient. He is willing to increase his atorvastatin, no pretty good adherence ot his current regimen.Target LDL should be < 70.  - atorvastatin (LIPITOR) 80 MG tablet; Take 0.5 tablets (40 mg total) by mouth daily.  Dispense: 90 tablet; Refill: 3

## 2022-01-01 ENCOUNTER — Encounter: Payer: Self-pay | Admitting: Gastroenterology

## 2022-01-01 NOTE — Addendum Note (Signed)
Addended by: Haydee Salter on: 01/01/2022 09:08 AM   Modules accepted: Orders

## 2022-01-02 LAB — URINE CULTURE
MICRO NUMBER:: 13033690
Result:: NO GROWTH
SPECIMEN QUALITY:: ADEQUATE

## 2022-01-02 LAB — URINALYSIS W MICROSCOPIC + REFLEX CULTURE
Bacteria, UA: NONE SEEN /HPF
Bilirubin Urine: NEGATIVE
Glucose, UA: NEGATIVE
Hyaline Cast: NONE SEEN /LPF
Ketones, ur: NEGATIVE
Nitrites, Initial: NEGATIVE
Specific Gravity, Urine: 1.02 (ref 1.001–1.035)
Squamous Epithelial / HPF: NONE SEEN /HPF (ref <=5)
WBC, UA: NONE SEEN /HPF (ref 0–5)
pH: 6 (ref 5.0–8.0)

## 2022-01-02 LAB — CULTURE INDICATED

## 2022-01-04 ENCOUNTER — Encounter: Payer: Self-pay | Admitting: Family Medicine

## 2022-01-04 DIAGNOSIS — R1032 Left lower quadrant pain: Secondary | ICD-10-CM

## 2022-01-04 MED ORDER — HYDROCODONE-IBUPROFEN 5-200 MG PO TABS: 1.0000 | ORAL_TABLET | Freq: Three times a day (TID) | ORAL | 0 refills | Status: DC | PRN

## 2022-01-04 MED ORDER — TAMSULOSIN HCL 0.4 MG PO CAPS: 0.4000 mg | ORAL_CAPSULE | Freq: Every day | ORAL | 0 refills | Status: DC

## 2022-01-07 ENCOUNTER — Telehealth: Payer: Self-pay | Admitting: Family Medicine

## 2022-01-07 DIAGNOSIS — R1032 Left lower quadrant pain: Secondary | ICD-10-CM

## 2022-01-07 MED ORDER — HYDROCODONE-IBUPROFEN 5-200 MG PO TABS: 1.0000 | ORAL_TABLET | Freq: Three times a day (TID) | ORAL | 0 refills | Status: DC | PRN

## 2022-01-07 NOTE — Telephone Encounter (Signed)
[facility], they are out of stock and will not have it till Friday.  [facility] [address]  Thanks. Dm/cma

## 2022-01-07 NOTE — Telephone Encounter (Signed)
Left detailed VM that [facility] [address] and to call back if any issues. Dm/cma

## 2022-01-07 NOTE — Telephone Encounter (Signed)
Pt is wanting his hydrocodone-ibuprofen (VICOPROFEN) 5-200 MG tablet [[id number]]  sent [facility]. He has kidney stones and is having a great deal of pain. Where we sent the script to  [facility] on [address] can not fill this until 01/09/22.

## 2022-01-08 ENCOUNTER — Telehealth: Payer: Self-pay | Admitting: Family Medicine

## 2022-01-08 DIAGNOSIS — R1032 Left lower quadrant pain: Secondary | ICD-10-CM

## 2022-01-08 MED ORDER — HYDROCODONE-IBUPROFEN 5-200 MG PO TABS: 1.0000 | ORAL_TABLET | Freq: Three times a day (TID) | ORAL | 0 refills | Status: DC | PRN

## 2022-01-08 NOTE — Telephone Encounter (Signed)
Caller Name: Whyatt Klinger Call back phone #: [phone]  MEDICATION(S): hydrocodone-ibuprofen (VICOPROFEN) 5-200 MG tablet [[id number]]    Days of Med Remaining: none, the store we sent it to is out of it [facility]. This script is at [address],   Has the patient contacted their pharmacy (YES/NO)?  Yes, IF YES, when and what did the pharmacy advise?  IF NO, request that the patient contact the pharmacy for the refills in the future.             The pharmacy will send an electronic request (except for controlled medications).  [facility] [address]  ~~~Please advise patient/caregiver to allow 2-3 business days to process RX refills.

## 2022-01-08 NOTE — Telephone Encounter (Signed)
Left detailed VM that Rx was sent to the pharmacy.  Dm/cma

## 2022-01-08 NOTE — Telephone Encounter (Signed)
Called pharmacy and confirmed [facility] [address] [facility] out but the [facility] [address] does [facility] it.   Can you please and thank you send the Vicoprofen to them.  Thanks.  Dm/cma

## 2022-01-22 ENCOUNTER — Other Ambulatory Visit: Payer: Self-pay

## 2022-01-22 ENCOUNTER — Ambulatory Visit (AMBULATORY_SURGERY_CENTER): Payer: BC Managed Care – PPO | Admitting: *Deleted

## 2022-01-22 VITALS — Ht 68.0 in | Wt 160.0 lb

## 2022-01-22 DIAGNOSIS — Z1211 Encounter for screening for malignant neoplasm of colon: Secondary | ICD-10-CM

## 2022-01-22 MED ORDER — NA SULFATE-K SULFATE-MG SULF 17.5-3.13-1.6 GM/177ML PO SOLN: 1.0000 | Freq: Once | ORAL | 0 refills | Status: AC

## 2022-01-22 NOTE — Progress Notes (Signed)

## 2022-01-23 ENCOUNTER — Ambulatory Visit
Admission: RE | Admit: 2022-01-23 | Discharge: 2022-01-23 | Disposition: A | Discharge status: Home / Self Care | Payer: BC Managed Care – PPO | Source: Ambulatory Visit | Attending: Family Medicine | Admitting: Family Medicine

## 2022-01-23 DIAGNOSIS — R319 Hematuria, unspecified: Secondary | ICD-10-CM | POA: Diagnosis not present

## 2022-01-23 DIAGNOSIS — Z87891 Personal history of nicotine dependence: Secondary | ICD-10-CM

## 2022-01-23 DIAGNOSIS — K5730 Diverticulosis of large intestine without perforation or abscess without bleeding: Secondary | ICD-10-CM | POA: Diagnosis not present

## 2022-01-23 DIAGNOSIS — R1032 Left lower quadrant pain: Secondary | ICD-10-CM

## 2022-01-23 DIAGNOSIS — M47816 Spondylosis without myelopathy or radiculopathy, lumbar region: Secondary | ICD-10-CM | POA: Diagnosis not present

## 2022-01-23 DIAGNOSIS — I700 Atherosclerosis of aorta: Secondary | ICD-10-CM | POA: Diagnosis not present

## 2022-01-25 ENCOUNTER — Other Ambulatory Visit: Payer: Self-pay | Admitting: Family Medicine

## 2022-01-25 ENCOUNTER — Encounter: Payer: Self-pay | Admitting: Family Medicine

## 2022-01-25 DIAGNOSIS — E041 Nontoxic single thyroid nodule: Secondary | ICD-10-CM

## 2022-01-25 DIAGNOSIS — J439 Emphysema, unspecified: Secondary | ICD-10-CM | POA: Insufficient documentation

## 2022-01-28 ENCOUNTER — Encounter: Payer: Self-pay | Admitting: Family Medicine

## 2022-01-28 DIAGNOSIS — K5790 Diverticulosis of intestine, part unspecified, without perforation or abscess without bleeding: Secondary | ICD-10-CM | POA: Insufficient documentation

## 2022-01-28 DIAGNOSIS — I700 Atherosclerosis of aorta: Secondary | ICD-10-CM | POA: Insufficient documentation

## 2022-01-29 ENCOUNTER — Other Ambulatory Visit: Payer: BC Managed Care – PPO

## 2022-01-30 ENCOUNTER — Ambulatory Visit
Admission: RE | Admit: 2022-01-30 | Discharge: 2022-01-30 | Disposition: A | Discharge status: Home / Self Care | Payer: BC Managed Care – PPO | Source: Ambulatory Visit | Attending: Family Medicine | Admitting: Family Medicine

## 2022-01-30 DIAGNOSIS — E041 Nontoxic single thyroid nodule: Secondary | ICD-10-CM | POA: Diagnosis not present

## 2022-02-01 ENCOUNTER — Encounter: Payer: Self-pay | Admitting: Family Medicine

## 2022-02-01 DIAGNOSIS — E041 Nontoxic single thyroid nodule: Secondary | ICD-10-CM

## 2022-02-08 ENCOUNTER — Encounter: Payer: Self-pay | Admitting: Gastroenterology

## 2022-02-12 ENCOUNTER — Ambulatory Visit (AMBULATORY_SURGERY_CENTER): Payer: BC Managed Care – PPO | Admitting: Gastroenterology

## 2022-02-12 ENCOUNTER — Encounter: Payer: Self-pay | Admitting: Gastroenterology

## 2022-02-12 VITALS — BP 105/52 | HR 53 | Temp 98.6°F | Resp 10 | Ht 68.0 in | Wt 160.0 lb

## 2022-02-12 DIAGNOSIS — Z1211 Encounter for screening for malignant neoplasm of colon: Secondary | ICD-10-CM

## 2022-02-12 HISTORY — PX: COLONOSCOPY: SHX174

## 2022-02-12 MED ORDER — SODIUM CHLORIDE 0.9 % IV SOLN: 500.0000 mL | Freq: Once | INTRAVENOUS | Status: DC

## 2022-02-12 NOTE — Progress Notes (Signed)
PT taken to PACU. Monitors in place. VSS. Report given to RN. 

## 2022-02-12 NOTE — Progress Notes (Signed)
? ?Referring Provider: Haydee Salter, MD ?Primary Care Physician:  Haydee Salter, MD ? ?Indication for Procedure:  Colon cancer screening ? ? ?IMPRESSION:  ?Need for colon cancer screening ?Appropriate candidate for monitored anesthesia care ? ?PLAN: ?Colonoscopy in the [facility] today ? ? ?HPI: Tommaso Cavitt Currier is a 51 y.o. male presents for screening colonoscopy. ? ?No prior colonoscopy or colon cancer screening. ? ?Recent diverticulitis.  ? ?No known family history of colon cancer or polyps. No family history of uterine/endometrial cancer, pancreatic cancer or gastric/stomach cancer. ? ? ?Past Medical History:  ?Diagnosis Date  ? CAD (coronary artery disease)   ? a. s/p MI @ age 71 with PCI of the RCA;  b. CTO of the LAD;  c. 11/2013 NSTEMI/PCI: LM nl, LAD 100 L->L collats, LCX 50p, OM2 80p, OM1 60ost, RCA 100p (3.5x38 Promus DES), RPL 99/40mPTCA only, EF 40-50%;  c. 11/2013 Echo: EF 50-55%, Gr1 DD, mild MR.  ? HYPERLIPIDEMIA   ? HYPERTENSION   ? Kidney stone 11/2021  ? MITRAL REGURGITATION   ? Myocardial infarction ([facility]   ? at age 89,45 ? Tobacco abuse   ? ? ?Past Surgical History:  ?Procedure Laterality Date  ? COLONOSCOPY  02/12/2022  ? CORONARY STENT PLACEMENT    ? LEFT HEART CATHETERIZATION WITH CORONARY ANGIOGRAM N/A 12/01/2013  ? Procedure: LEFT HEART CATHETERIZATION WITH CORONARY ANGIOGRAM;  Surgeon: DLeonie Man MD;  [facility];  Service: Cardiovascular;  Laterality: N/A;  ? LITHOTRIPSY  2003  ? ? ?Current Outpatient Medications  ?Medication Sig Dispense Refill  ? aspirin 81 MG tablet Take 81 mg by mouth daily.     ? atorvastatin (LIPITOR) 80 MG tablet Take 0.5 tablets (40 mg total) by mouth daily. 90 tablet 3  ? ezetimibe (ZETIA) 10 MG tablet Take 1 tablet (10 mg total) by mouth daily. 90 tablet 3  ? metoprolol succinate (TOPROL-XL) 25 MG 24 hr tablet Take 1 tablet (25 mg total) by mouth daily. 90 tablet 3  ? hydrocodone-ibuprofen (VICOPROFEN) 5-200 MG tablet Take 1 tablet by mouth  every 8 (eight) hours as needed for pain. (Patient not taking: Reported on 02/12/2022) 15 tablet 0  ? nitroGLYCERIN (NITROSTAT) 0.4 MG SL tablet Dissolve 1 tablet under the tongue every 5 minutes as  needed for chest pain (Patient not taking: Reported on 01/22/2022) 25 tablet 0  ? tamsulosin (FLOMAX) 0.4 MG CAPS capsule Take 1 capsule (0.4 mg total) by mouth daily. (Patient not taking: Reported on 01/22/2022) 14 capsule 0  ? ?Current Facility-Administered Medications  ?Medication Dose Route Frequency Provider Last Rate Last Admin  ? 0.9 %  sodium chloride infusion  500 mL Intravenous Once BThornton Park MD      ? ? ?Allergies as of 02/12/2022  ? (No Known Allergies)  ? ? ?Family History  ?Problem Relation Age of Onset  ? Cancer Mother   ?     lung  ? Heart disease Mother   ? Hypertension Mother   ? COPD Mother   ? Cancer Father   ?     skin, prostrate  ? Diabetes Father   ?     Type 1  ? Heart disease Father   ? Hypertension Father   ? Cancer Sister   ?     Skin  ? Coronary artery disease Other   ? Colon cancer Neg Hx   ? Colon polyps Neg Hx   ? Esophageal cancer Neg Hx   ?  Rectal cancer Neg Hx   ? Stomach cancer Neg Hx   ? ? ? ?Physical Exam: ?General:   Alert,  well-nourished, pleasant and cooperative in NAD ?Head:  Normocephalic and atraumatic. ?Eyes:  Sclera clear, no icterus.   Conjunctiva pink. ?Mouth:  No deformity or lesions.   ?Neck:  Supple; no masses or thyromegaly. ?Lungs:  Clear throughout to auscultation.   No wheezes. ?Heart:  Regular rate and rhythm; no murmurs. ?Abdomen:  Soft, non-tender, nondistended, normal bowel sounds, no rebound or guarding.  ?Msk:  Symmetrical. No boney deformities ?LAD: No inguinal or umbilical LAD ?Extremities:  No clubbing or edema. ?Neurologic:  Alert and  oriented x4;  grossly nonfocal ?Skin:  No obvious rash or bruise. ?Psych:  Alert and cooperative. Normal mood and affect. ? ? ? ? ?Studies/Results: ?No results found. ? ? ? ?Denisa Enterline L. Tarri Glenn, MD, MPH ?02/12/2022, 8:52  AM ? ? ? ?  ?

## 2022-02-12 NOTE — Progress Notes (Signed)
Pt's states no medical or surgical changes since previsit or office visit.  Vitals DT 

## 2022-02-12 NOTE — Op Note (Signed)
[facility] ?Patient Name: Austin Vincent ?Procedure Date: 02/12/2022 8:53 AM ?MRN: [id number] ?Endoscopist: Thornton Park MD, MD ?Age: 51 ?Referring MD:  ?Date of Birth: [birth year] ?Gender: Male ?Account #: [id number] ?Procedure:                Colonoscopy ?Indications:              Screening for colorectal malignant neoplasm, This  ?                          is the patient's first colonoscopy ?Medicines:                Monitored Anesthesia Care ?Procedure:                Pre-Anesthesia Assessment: ?                          - Prior to the procedure, a History and Physical  ?                          was performed, and patient medications and  ?                          allergies were reviewed. The patient's tolerance of  ?                          previous anesthesia was also reviewed. The risks  ?                          and benefits of the procedure and the sedation  ?                          options and risks were discussed with the patient.  ?                          All questions were answered, and informed consent  ?                          was obtained. Prior Anticoagulants: The patient has  ?                          taken no previous anticoagulant or antiplatelet  ?                          agents. ASA Grade Assessment: II - A patient with  ?                          mild systemic disease. After reviewing the risks  ?                          and benefits, the patient was deemed in  ?                          satisfactory condition to undergo the procedure. ?  After obtaining informed consent, the colonoscope  ?                          was passed under direct vision. Throughout the  ?                          procedure, the patient's blood pressure, pulse, and  ?                          oxygen saturations were monitored continuously. The  ?                          Olympus CF-HQ190L (Serial# [id number]) Colonoscope was  ?                          introduced through the  anus and advanced to the 3  ?                          cm into the ileum. A second forward view of the  ?                          right colon was performed. The colonoscopy was  ?                          performed without difficulty. The patient tolerated  ?                          the procedure well. The quality of the bowel  ?                          preparation was excellent. The terminal ileum,  ?                          ileocecal valve, appendiceal orifice, and rectum  ?                          were photographed. ?Scope In: 9:05:15 AM ?Scope Out: 9:20:20 AM ?Scope Withdrawal Time: 0 hours 12 minutes 11 seconds  ?Total Procedure Duration: 0 hours 15 minutes 5 seconds  ?Findings:                 The perianal and digital rectal examinations were  ?                          normal. ?                          Multiple small and large-mouthed diverticula were  ?                          found in the sigmoid colon, descending colon,  ?                          transverse colon and ascending colon. ?  The exam was otherwise without abnormality on  ?                          direct and retroflexion views. ?Complications:            No immediate complications. ?Estimated Blood Loss:     Estimated blood loss: none. ?Impression:               - Diverticulosis in the sigmoid colon, in the  ?                          descending colon, in the transverse colon and in  ?                          the ascending colon. ?                          - The examination was otherwise normal on direct  ?                          and retroflexion views. ?                          - No specimens collected. ?Recommendation:           - Patient has a contact number available for  ?                          emergencies. The signs and symptoms of potential  ?                          delayed complications were discussed with the  ?                          patient. Return to normal activities tomorrow.  ?                           Written discharge instructions were provided to the  ?                          patient. ?                          - Follow a high fiber diet. Drink at least 64  ?                          ounces of water daily. Add a daily stool bulking  ?                          agent such as psyllium (an exampled would be  ?                          Metamucil). ?                          - There is no need to avoid seeds, nuts, corn, and  ?  berries. ?                          - Continue present medications. ?                          - Repeat colonoscopy in 10 years for surveillance,  ?                          earlier with new symptoms. ?                          - Emerging evidence supports eating a diet of  ?                          fruits, vegetables, grains, calcium, and yogurt  ?                          while reducing red meat and alcohol may reduce the  ?                          risk of colon cancer. ?                          - Limit the use of NSAIDs as these may increase  ?                          your risk for recurrent diverticulitis. ?                          - Thank you for allowing me to be involved in your  ?                          colon cancer prevention. ?Thornton Park MD, MD ?02/12/2022 9:25:59 AM ?This report has been signed electronically. ?

## 2022-02-12 NOTE — Patient Instructions (Signed)
Discharge instructions given. ?Handout on Diverticulosis. ?Resume previous medications. ?See Recommendations. ?YOU HAD AN ENDOSCOPIC PROCEDURE TODAY AT [facility]:   Refer to the procedure report that was given to you for any specific questions about what was found during the examination.  If the procedure report does not answer your questions, please call your gastroenterologist to clarify.  If you requested that your care partner not be given the details of your procedure findings, then the procedure report has been included in a sealed envelope for you to review at your convenience later. ? ?YOU SHOULD EXPECT: Some feelings of bloating in the abdomen. Passage of more gas than usual.  Walking can help get rid of the air that was put into your GI tract during the procedure and reduce the bloating. If you had a lower endoscopy (such as a colonoscopy or flexible sigmoidoscopy) you may notice spotting of blood in your stool or on the toilet paper. If you underwent a bowel prep for your procedure, you may not have a normal bowel movement for a few days. ? ?Please Note:  You might notice some irritation and congestion in your nose or some drainage.  This is from the oxygen used during your procedure.  There is no need for concern and it should clear up in a day or so. ? ?SYMPTOMS TO REPORT IMMEDIATELY: ? ?Following lower endoscopy (colonoscopy or flexible sigmoidoscopy): ? Excessive amounts of blood in the stool ? Significant tenderness or worsening of abdominal pains ? Swelling of the abdomen that is new, acute ? Fever of 100?F or higher ? ? ?For urgent or emergent issues, a gastroenterologist can be reached at any hour by calling [phone]. ?Do not use MyChart messaging for urgent concerns.  ? ? ?DIET:  We do recommend a small meal at first, but then you may proceed to your regular diet.  Drink plenty of fluids but you should avoid alcoholic beverages for 24 hours. ? ?ACTIVITY:  You should plan  to take it easy for the rest of today and you should NOT DRIVE or use heavy machinery until tomorrow (because of the sedation medicines used during the test).   ? ?FOLLOW UP: ?Our staff will call the number listed on your records 48-72 hours following your procedure to check on you and address any questions or concerns that you may have regarding the information given to you following your procedure. If we do not reach you, we will leave a message.  We will attempt to reach you two times.  During this call, we will ask if you have developed any symptoms of COVID 19. If you develop any symptoms (ie: fever, flu-like symptoms, shortness of breath, cough etc.) before then, please call [phone].  If you test positive for Covid 19 in the 2 weeks post procedure, please call and report this information to [place].   ? ?If any biopsies were taken you will be contacted by phone or by letter within the next 1-3 weeks.  Please call us at [phone] if you have not heard about the biopsies in 3 weeks.  ? ? ?SIGNATURES/CONFIDENTIALITY: ?You and/or your care partner have signed paperwork which will be entered into your electronic medical record.  These signatures attest to the fact that that the information above on your After Visit Summary has been reviewed and is understood.  Full responsibility of the confidentiality of this discharge information lies with you and/or your care-partner.  ?

## 2022-02-14 ENCOUNTER — Telehealth: Payer: Self-pay

## 2022-02-14 NOTE — Telephone Encounter (Signed)
Called paint and advised of the referral being sent to [facility] and gave him the number to be able to reach out to them.  Dm/cma ? ?

## 2022-02-14 NOTE — Telephone Encounter (Signed)
?  Follow up Call- ? ? ?  02/12/2022  ?  7:43 AM  ?Call back number  ?Post procedure Call Back phone  # [phone]  ?Permission to leave phone message Yes  ?  ? ?Patient questions: ? ?Do you have a fever, pain , or abdominal swelling? No. ?Pain Score  0 * ? ?Have you tolerated food without any problems? Yes.   ? ?Have you been able to return to your normal activities? Yes.   ? ?Do you have any questions about your discharge instructions: ?Diet   No. ?Medications  No. ?Follow up visit  No. ? ?Do you have questions or concerns about your Care? No. ? ?Actions: ?* If pain score is 4 or above: ?No action needed, pain <4. ? ? ?

## 2022-02-21 DIAGNOSIS — E0789 Other specified disorders of thyroid: Secondary | ICD-10-CM | POA: Diagnosis not present

## 2022-02-21 DIAGNOSIS — Z87891 Personal history of nicotine dependence: Secondary | ICD-10-CM | POA: Diagnosis not present

## 2022-02-25 ENCOUNTER — Other Ambulatory Visit: Payer: Self-pay | Admitting: Otolaryngology

## 2022-02-25 DIAGNOSIS — E041 Nontoxic single thyroid nodule: Secondary | ICD-10-CM

## 2022-02-26 ENCOUNTER — Telehealth: Payer: Self-pay

## 2022-02-26 NOTE — Telephone Encounter (Signed)
Patient called stating that his having abdominal pain that is radiating down to his groin area.  He has been taking Hydrocodone and it works for a bit and then the pain comes back.  He ate some popcorn on Friday and states that he hasn't had a bowel movement in 3 days.  He is in [place] on vacation right now.  Advised that he could go to an urgent care to be evaluated or he could try to taken a stool softner to see if it helps with the constipation.  He will call us if he still having issues once he gets back on 03/04/22. Dm/cma ? ?

## 2022-03-07 ENCOUNTER — Other Ambulatory Visit (HOSPITAL_COMMUNITY)
Admission: RE | Admit: 2022-03-07 | Discharge: 2022-03-07 | Disposition: A | Discharge status: Home / Self Care | Payer: BC Managed Care – PPO | Source: Ambulatory Visit | Attending: Otolaryngology | Admitting: Otolaryngology

## 2022-03-07 ENCOUNTER — Ambulatory Visit
Admission: RE | Admit: 2022-03-07 | Discharge: 2022-03-07 | Disposition: A | Discharge status: Home / Self Care | Payer: BC Managed Care – PPO | Source: Ambulatory Visit | Attending: Otolaryngology | Admitting: Otolaryngology

## 2022-03-07 DIAGNOSIS — E041 Nontoxic single thyroid nodule: Secondary | ICD-10-CM

## 2022-03-07 DIAGNOSIS — C73 Malignant neoplasm of thyroid gland: Secondary | ICD-10-CM | POA: Diagnosis not present

## 2022-03-11 LAB — CYTOLOGY - NON PAP

## 2022-03-12 ENCOUNTER — Telehealth: Payer: Self-pay | Admitting: Family Medicine

## 2022-03-12 NOTE — Telephone Encounter (Signed)
Spoke to patient, scheduled him with Dr Gena Fray on 03/13/22'@1'$ :00 pm. Dm/cma ? ?

## 2022-03-12 NOTE — Telephone Encounter (Signed)
Pt called wanting to speak with Denise/Dr Gena Fray. 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  ? ? ?

## 2022-03-13 ENCOUNTER — Ambulatory Visit (INDEPENDENT_AMBULATORY_CARE_PROVIDER_SITE_OTHER): Payer: BC Managed Care – PPO | Admitting: Family Medicine

## 2022-03-13 ENCOUNTER — Encounter: Payer: Self-pay | Admitting: Family Medicine

## 2022-03-13 VITALS — BP 122/78 | HR 56 | Temp 97.2°F | Ht 68.0 in | Wt 165.2 lb

## 2022-03-13 DIAGNOSIS — C73 Malignant neoplasm of thyroid gland: Secondary | ICD-10-CM | POA: Diagnosis not present

## 2022-03-13 DIAGNOSIS — R301 Vesical tenesmus: Secondary | ICD-10-CM | POA: Diagnosis not present

## 2022-03-13 DIAGNOSIS — E785 Hyperlipidemia, unspecified: Secondary | ICD-10-CM

## 2022-03-13 NOTE — Progress Notes (Signed)
?[facility] ?[facility] ?[place] ?[place] [address] ?Dept: [phone] ?Dept Fax: [phone] ? ?Office Visit ? ?Subjective:  ? ? Patient ID: Austin Vincent, male    DOB: 05-Feb-1971, 51 y.o..   MRN: [id number] ? ?Chief Complaint  ?Patient presents with  ? Follow-up  ?  F/u to discuss biopsy report.   Still having some discomfort with urination has taken Bactrim and Metronidazole on 02/26/22 for this.    ? ? ?History of Present Illness: ? ?Patient is in today for discussion of the results of his recent thyroid biopsy. In March, Mr. Umbarger had a LDCT for lung cancer screening. This demonstrated a partially imaged low-attenuation right thyroid nodule at least 2.2 cm in size. It was recommended for Mr. Wheeling to have this biopsied. He was referred to Dr. Constance Holster (ENT) who performed the biopsy on 4/27. ? ?Mr. Boss also notes that he has had further episodes of LLQ abdominal pain. He has had two more courses of antibiotics for presumed diverticulitis. He has a history of prior kidney stones and has had some microscopic hematuria over the past few months. His renal CT stone study did not show any stones present in March. He now notes that he is having daily sensations of spasm after and in between urination. He has not seen any blood in his urine. ? ?Mr. Stann has a history of an NSTEMI last year. He has hyperlipidemia and was not at goal in Feb. (LDL was 195). We increased his atorvastatin dose to 80 mg and he is on ezetimibe 10 mg. ? ?Past Medical History: ?Patient Active Problem List  ? Diagnosis Date Noted  ? 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 Aortic atherosclerosis ([place]) 01/28/2022  ? Diverticulosis 01/28/2022  ? Emphysema of lung ([facility]) 01/25/2022  ? Thyroid nodule 01/25/2022  ? History of tobacco abuse 12/26/2021  ? History of non-ST elevation myocardial infarction (NSTEMI) 12/01/2013  ? Hyperlipidemia 11/28/2009  ? Mitral  regurgitation 11/28/2009  ? Essential hypertension 11/28/2009  ? Coronary artery disease involving native coronary artery of native heart without angina pectoris 11/28/2009  ? ?Past Surgical History:  ?Procedure Laterality Date  ? COLONOSCOPY  02/12/2022  ? CORONARY STENT PLACEMENT    ? LEFT HEART CATHETERIZATION WITH CORONARY ANGIOGRAM N/A 12/01/2013  ? Procedure: LEFT HEART CATHETERIZATION WITH CORONARY ANGIOGRAM;  Surgeon: Leonie Man, MD;  Location: [facility] LAB;  Service: Cardiovascular;  Laterality: N/A;  ? LITHOTRIPSY  2003  ? ?Family History  ?Problem Relation Age of Onset  ? Cancer Mother   ?     lung  ? Heart disease Mother   ? Hypertension Mother   ? COPD Mother   ? Cancer Father   ?     skin, prostrate  ? Diabetes Father   ?     Type 1  ? Heart disease Father   ? Hypertension Father   ? Cancer Sister   ?     Skin  ? Coronary artery disease Other   ? Colon cancer Neg Hx   ? Colon polyps Neg Hx   ? Esophageal cancer Neg Hx   ? Rectal cancer Neg Hx   ? Stomach cancer Neg Hx   ? ?Outpatient Medications Prior to Visit  ?Medication Sig Dispense Refill  ? aspirin 81 MG tablet Take 81 mg by mouth daily.     ? atorvastatin (LIPITOR) 80 MG tablet Take 0.5 tablets (40 mg total) by mouth daily. 90 tablet  3  ? ezetimibe (ZETIA) 10 MG tablet Take 1 tablet (10 mg total) by mouth daily. 90 tablet 3  ? metoprolol succinate (TOPROL-XL) 25 MG 24 hr tablet Take 1 tablet (25 mg total) by mouth daily. 90 tablet 3  ? nitroGLYCERIN (NITROSTAT) 0.4 MG SL tablet Dissolve 1 tablet under the tongue every 5 minutes as  needed for chest pain 25 tablet 0  ? tamsulosin (FLOMAX) 0.4 MG CAPS capsule Take 1 capsule (0.4 mg total) by mouth daily. 14 capsule 0  ? hydrocodone-ibuprofen (VICOPROFEN) 5-200 MG tablet Take 1 tablet by mouth every 8 (eight) hours as needed for pain. (Patient not taking: Reported on 02/12/2022) 15 tablet 0  ? ?No facility-administered medications prior to visit.  ? ?No Known Allergies ?   ?Objective:  ? ?Today's  Vitals  ? 03/13/22 1303  ?BP: 122/78  ?Pulse: (!) 56  ?Temp: (!) 97.2 ?F (36.2 ?C)  ?TempSrc: Temporal  ?SpO2: 96%  ?Weight: 165 lb 3.2 oz (74.9 kg)  ?Height: '5\' 8"'$  (1.727 m)  ? ?Body mass index is 25.12 kg/m?.  ? ?General: Well developed, well nourished. No acute distress. ?Psych: Alert and oriented. Normal mood and affect. ? ?Health Maintenance Due  ?Topic Date Due  ? HIV Screening  Never done  ? Hepatitis C Screening  Never done  ? Zoster Vaccines- Shingrix (1 of 2) Never done  ?   ?Lab Results: ?FINAL MICROSCOPIC DIAGNOSIS:  ?- Malignant cells consistent with medullary thyroid carcinoma (Bethesda category VI)  ? ?Assessment & Plan:  ? ?1. 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 Based on the initial size of the thyroid nodule, this would be at least Stage II, however, final staging will occur after surgery. We discussed the prognosis. Treatment would involve surgery for total thyroidectomy. He has not heard back from Dr. Constance Holster as yet about scheduling for follow-up. I will go ahead and check a baseline serum calcitonin and CEA level. I recommend he call Dr. Janeice Robinson office about his follow-up appointment. ? ?- Calcitonin ?- CEA ? ?2. Painful bladder spasm ?Mr. Steury's lower abdominal pain is unclear as tot he etiology. He has known diverticula and presumed episodes of diverticulitis. He also has known prior kidney stones and microscopic hematuria. I will repeat a urine today. If the hematuria persists, I would consider referring him to a urologist for further assessment. If his urine is clear, I would consider referral to GI related to the question of recurrent diverticulitis. ? ?- Urinalysis w microscopic + reflex cultur ? ? ?Return in about 3 months (around 06/13/2022) for Reassessment.  ? ?Haydee Salter, MD ?

## 2022-03-15 ENCOUNTER — Encounter: Payer: Self-pay | Admitting: Family Medicine

## 2022-03-15 MED ORDER — PHENAZOPYRIDINE HCL 100 MG PO TABS
100.0000 mg | ORAL_TABLET | Freq: Three times a day (TID) | ORAL | 0 refills | Status: DC
Start: 2022-03-15 — End: 2022-04-17

## 2022-03-15 NOTE — Addendum Note (Signed)
Addended by: Haydee Salter on: 03/15/2022 10:44 AM ? ? Modules accepted: Orders ? ?

## 2022-03-20 LAB — URINALYSIS W MICROSCOPIC + REFLEX CULTURE
Bacteria, UA: NONE SEEN /HPF
Bilirubin Urine: NEGATIVE
Glucose, UA: NEGATIVE
Hgb urine dipstick: NEGATIVE
Hyaline Cast: NONE SEEN /LPF
Ketones, ur: NEGATIVE
Leukocyte Esterase: NEGATIVE
Nitrites, Initial: NEGATIVE
Protein, ur: NEGATIVE
RBC / HPF: NONE SEEN /HPF (ref 0–2)
Specific Gravity, Urine: 1.022 (ref 1.001–1.035)
Squamous Epithelial / HPF: NONE SEEN /HPF (ref <=5)
pH: 6.5 (ref 5.0–8.0)

## 2022-03-20 LAB — URINE CULTURE
MICRO NUMBER:: 13349124
Result:: NO GROWTH
SPECIMEN QUALITY:: ADEQUATE

## 2022-03-20 LAB — CALCITONIN: Calcitonin: 868 pg/mL — ABNORMAL HIGH (ref <=10)

## 2022-03-20 LAB — CULTURE INDICATED

## 2022-03-20 LAB — CEA: CEA: 32.2 ng/mL — ABNORMAL HIGH

## 2022-03-21 ENCOUNTER — Encounter: Payer: Self-pay | Admitting: Family Medicine

## 2022-03-21 DIAGNOSIS — C73 Malignant neoplasm of thyroid gland: Secondary | ICD-10-CM | POA: Diagnosis not present

## 2022-03-25 ENCOUNTER — Encounter: Payer: Self-pay | Admitting: Family Medicine

## 2022-03-25 ENCOUNTER — Ambulatory Visit: Payer: BC Managed Care – PPO | Admitting: Family Medicine

## 2022-03-25 DIAGNOSIS — R1032 Left lower quadrant pain: Secondary | ICD-10-CM

## 2022-03-25 DIAGNOSIS — R301 Vesical tenesmus: Secondary | ICD-10-CM

## 2022-03-26 ENCOUNTER — Encounter (HOSPITAL_COMMUNITY): Payer: Self-pay

## 2022-03-27 ENCOUNTER — Other Ambulatory Visit: Payer: Self-pay | Admitting: *Deleted

## 2022-03-27 DIAGNOSIS — I2510 Atherosclerotic heart disease of native coronary artery without angina pectoris: Secondary | ICD-10-CM

## 2022-03-27 DIAGNOSIS — E7800 Pure hypercholesterolemia, unspecified: Secondary | ICD-10-CM

## 2022-03-27 LAB — HEPATIC FUNCTION PANEL
ALT: 34 IU/L (ref 0–44)
AST: 33 IU/L (ref 0–40)
Albumin: 4.5 g/dL (ref 3.8–4.9)
Alkaline Phosphatase: 74 IU/L (ref 44–121)
Bilirubin Total: 0.6 mg/dL (ref 0.0–1.2)
Bilirubin, Direct: 0.17 mg/dL (ref 0.00–0.40)
Total Protein: 7.2 g/dL (ref 6.0–8.5)

## 2022-03-27 LAB — LIPID PANEL
Chol/HDL Ratio: 2.8 ratio (ref 0.0–5.0)
Cholesterol, Total: 126 mg/dL (ref 100–199)
HDL: 45 mg/dL (ref >39)
LDL Chol Calc (NIH): 68 mg/dL (ref 0–99)
Triglycerides: 59 mg/dL (ref 0–149)
VLDL Cholesterol Cal: 13 mg/dL (ref 5–40)

## 2022-03-28 NOTE — Progress Notes (Signed)
Cardiology Clinic Note   Patient Name: Austin Vincent Date of Encounter: 03/28/2022  Primary Care Provider:  Haydee Salter, MD Primary Cardiologist:  Dr. Stanford Vincent   Patient Profile    51 year old male patient with known history of coronary artery disease with a prior inferior MI in 8 (age 26), status post stent to the right coronary artery, NSTEMI in January 2015 with an occluded LAD (chronic), RCA was also occluded.  Reduced EF of 45 to 55%.  He was status post PCI of his right coronary artery and PDA at that time.  He also has a history of mild mitral regurgitation, hyperlipidemia on atorvastatin and Zetia, hypertension, and tobacco abuse.  Last seen in the office on 07/24/2020.    Since being seen he is being followed by Austin Vincent to discuss recent LDCT for lung cancer screening which revealed a partially imaged low-attenuation right thyroid nodule of 2.2 cm.  This was biopsied by Austin Vincent (ENT) on 03/07/2022 which was positive medullary thyroid carcinoma stage II, and is planned for total thyroidectomy.  Date for this procedure has not been determined..  Past Medical History    Past Medical History:  Diagnosis Date   CAD (coronary artery disease)    a. s/p MI @ age 15 with PCI of the RCA;  b. CTO of the LAD;  c. 11/2013 NSTEMI/PCI: LM nl, LAD 100 L->L collats, LCX 50p, OM2 80p, OM1 60ost, RCA 100p (3.5x38 Promus DES), RPL 99/50mPTCA only, EF 40-50%;  c. 11/2013 Echo: EF 50-55%, Gr1 DD, mild MR.   HYPERLIPIDEMIA    HYPERTENSION    Kidney stone 11/2021   MITRAL REGURGITATION    Myocardial infarction ([facility]    at age [age]  Tobacco abuse    Past Surgical History:  Procedure Laterality Date   COLONOSCOPY  02/12/2022   CORONARY STENT PLACEMENT     LEFT HEART CATHETERIZATION WITH CORONARY ANGIOGRAM N/A 12/01/2013   Procedure: LEFT HEART CATHETERIZATION WITH CORONARY ANGIOGRAM;  Surgeon: Austin Man MD;  [facility];  Service: Cardiovascular;  Laterality:  N/A;   LITHOTRIPSY  2003    Allergies  No Known Allergies  History of Present Illness    Austin Vincent to the office today for ongoing assessment and management of coronary artery disease, history of stent to the RCA in 1999 (at the age of 89, repeat intervention in 2015 with PCI of his right coronary artery and PDA, grade 1 diastolic dysfunction per echo in 2015, hypertension, hyperlipidemia, and tobacco abuse.    Has not been seen in the office since 2021.  Since being seen he has been diagnosed with stage II, medullary thyroid carcinoma and is being followed by ENT and primary care.  Surgery date has not been planned.  Home Medications    Current Outpatient Medications  Medication Sig Dispense Refill   aspirin 81 MG tablet Take 81 mg by mouth daily.      atorvastatin (LIPITOR) 80 MG tablet Take 0.5 tablets (40 mg total) by mouth daily. 90 tablet 3   ezetimibe (ZETIA) 10 MG tablet Take 1 tablet (10 mg total) by mouth daily. 90 tablet 3   hydrocodone-ibuprofen (VICOPROFEN) 5-200 MG tablet Take 1 tablet by mouth every 8 (eight) hours as needed for pain. (Patient not taking: Reported on 02/12/2022) 15 tablet 0   metoprolol succinate (TOPROL-XL) 25 MG 24 hr tablet Take 1 tablet (25 mg total) by mouth daily. 90 tablet 3   nitroGLYCERIN (NITROSTAT)  0.4 MG SL tablet Dissolve 1 tablet under the tongue every 5 minutes as  needed for chest pain 25 tablet 0   phenazopyridine (PYRIDIUM) 100 MG tablet Take 1 tablet (100 mg total) by mouth 3 (three) times daily with meals. 21 tablet 0   tamsulosin (FLOMAX) 0.4 MG CAPS capsule Take 1 capsule (0.4 mg total) by mouth daily. 14 capsule 0   No current facility-administered medications for this visit.     Family History    Family History  Problem Relation Age of Onset   Cancer Mother        Thyms   Heart disease Mother    Hypertension Mother    COPD Mother    Cancer Father        skin, prostrate   Diabetes Father        Type 1    Heart disease Father    Hypertension Father    Cancer Sister        Skin   Coronary artery disease Other    Colon cancer Neg Hx    Colon polyps Neg Hx    Esophageal cancer Neg Hx    Rectal cancer Neg Hx    Stomach cancer Neg Hx    He indicated that his mother is deceased. He indicated that his father is deceased. He indicated that his sister is alive. He indicated that the status of his neg hx is unknown. He indicated that the status of his other is unknown.  Social History    Social History   Socioeconomic History   Marital status: Divorced    Spouse name: Not on file   Number of children: 2   Years of education: Not on file   Highest education level: Bachelor's degree (e.g., BA, AB, BS)  Occupational History   Occupation: Hospital doctor    Comment: Actor  Tobacco Use   Smoking status: Former    Types: Cigarettes    Quit date: 2021    Years since quitting: 2.3    Passive exposure: Past (younger)   Smokeless tobacco: Never   Tobacco comments:    Nicotine pouches- 10/day--no tobacco  Vaping Use   Vaping Use: Former   Quit date: 11/25/2021  Substance and Sexual Activity   Alcohol use: Yes    Alcohol/week: 2.0 standard drinks    Types: 2 Cans of beer per week    Comment: socially   Drug use: Yes    Types: Marijuana    Comment: hemp products,occasionally   Sexual activity: Yes  Other Topics Concern   Not on file  Social History Narrative   Not on file   Social Determinants of Health   Financial Resource Strain: Not on file  Food Insecurity: Not on file  Transportation Needs: Not on file  Physical Activity: Not on file  Stress: Not on file  Social Connections: Not on file  Intimate Partner Violence: Not on file     Review of Systems    General:  No chills, fever, night sweats or weight changes.  Cardiovascular:  No chest pain, dyspnea on exertion, edema, orthopnea, palpitations, paroxysmal nocturnal dyspnea. Dermatological: No rash,  lesions/masses Respiratory: No cough, dyspnea Urologic: No hematuria, dysuria Abdominal:   No nausea, vomiting, diarrhea, bright red blood per rectum, melena, or hematemesis Neurologic:  No visual changes, wkns, changes in mental status. All other systems reviewed and are otherwise negative except as noted above.     Physical Exam    VS:  There were no vitals taken for this visit. , BMI There is no height or weight on file to calculate BMI.     GEN: Well nourished, well developed, in no acute distress. HEENT: normal. Neck: Supple, no JVD, carotid bruits, or masses. Cardiac: RRR, no murmurs, rubs, or gallops. No clubbing, cyanosis, edema.  Radials/DP/PT 2+ and equal bilaterally.  Respiratory:  Respirations regular and unlabored, clear to auscultation bilaterally. GI: Soft, nontender, nondistended, BS + x 4. MS: no deformity or atrophy. Skin: warm and dry, no rash. Neuro:  Strength and sensation are intact. Psych: Normal affect.  Accessory Clinical Findings    ECG personally reviewed by me today- NSR LVH, rate of 68 bpm. Prior inferior MI - No acute changes  Lab Results  Component Value Date   WBC 9.3 12/03/2013   HGB 12.0 (L) 12/03/2013   HCT 34.7 (L) 12/03/2013   MCV 89.4 12/03/2013   PLT 190 12/03/2013   Lab Results  Component Value Date   CREATININE 1.04 12/31/2021   BUN 20 12/31/2021   NA 137 12/31/2021   K 4.2 12/31/2021   CL 103 12/31/2021   CO2 31 12/31/2021   Lab Results  Component Value Date   ALT 34 03/27/2022   AST 33 03/27/2022   ALKPHOS 74 03/27/2022   BILITOT 0.6 03/27/2022   Lab Results  Component Value Date   CHOL 126 03/27/2022   HDL 45 03/27/2022   LDLCALC 68 03/27/2022   LDLDIRECT 190.7 03/24/2013   TRIG 59 03/27/2022   CHOLHDL 2.8 03/27/2022    No results found for: HGBA1C  Review of Prior Studies:   Assessment & Plan   Newly diagnosed medullary thyroid cancer with multiple questions concerning his cardiac response and prior  history. Wishes only to talk to Dr. Stanford Vincent.  Rescheduled to see Dr. Stanford Vincent 04/03/2022.     Current medicines are reviewed at length with the patient today.  I have spent  min's  dedicated to the care of this patient on the date of this encounter to include pre-visit review of records, assessment, management and diagnostic testing,with shared decision making. Signed, Phill Myron. West Pugh, ANP, [facility]   03/28/2022 11:57 AM    [facility] [address] Office [phone] Fax [phone]  Notice: This dictation was prepared with Dragon dictation along with smaller phrase technology. Any transcriptional errors that result from this process are unintentional and may not be corrected upon review.

## 2022-03-29 ENCOUNTER — Ambulatory Visit (INDEPENDENT_AMBULATORY_CARE_PROVIDER_SITE_OTHER): Payer: BC Managed Care – PPO | Admitting: Adult Health

## 2022-03-29 ENCOUNTER — Encounter: Payer: Self-pay | Admitting: Adult Health

## 2022-03-29 VITALS — BP 146/98 | HR 66 | Ht 68.0 in | Wt 163.0 lb

## 2022-03-29 DIAGNOSIS — I2510 Atherosclerotic heart disease of native coronary artery without angina pectoris: Secondary | ICD-10-CM | POA: Diagnosis not present

## 2022-03-29 NOTE — Patient Instructions (Signed)
Medication Instructions:  No Changes *If you need a refill on your cardiac medications before your next appointment, please call your pharmacy*   Lab Work: No Labs If you have labs (blood work) drawn today and your tests are completely normal, you will receive your results only by: [place] (if you have MyChart) OR A paper copy in the mail If you have any lab test that is abnormal or we need to change your treatment, we will call you to review the results.   Testing/Procedures: No Testing   Follow-Up: At [facility], you and your health needs are our priority.  As part of our continuing mission to provide you with exceptional heart care, we have created designated Provider Care Teams.  These Care Teams include your primary Cardiologist (physician) and Advanced Practice Providers (APPs -  Physician Assistants and Nurse Practitioners) who all work together to provide you with the care you need, when you need it.  We recommend signing up for the patient portal called "MyChart".  Sign up information is provided on this After Visit Summary.  MyChart is used to connect with patients for Virtual Visits (Telemedicine).  Patients are able to view lab/test results, encounter notes, upcoming appointments, etc.  Non-urgent messages can be sent to your provider as well.   To learn more about what you can do with MyChart, go to [email or web address].    Your next appointment:   Keep Scheduled Appointment  The format for your next appointment:   In Person  Provider:   Kirk Ruths, MD    Important Information About Sugar

## 2022-04-03 ENCOUNTER — Ambulatory Visit (INDEPENDENT_AMBULATORY_CARE_PROVIDER_SITE_OTHER): Payer: BC Managed Care – PPO | Admitting: Cardiology

## 2022-04-03 ENCOUNTER — Encounter: Payer: Self-pay | Admitting: Cardiology

## 2022-04-03 VITALS — BP 123/80 | HR 68 | Ht 68.0 in | Wt 163.8 lb

## 2022-04-03 DIAGNOSIS — I10 Essential (primary) hypertension: Secondary | ICD-10-CM

## 2022-04-03 DIAGNOSIS — I2510 Atherosclerotic heart disease of native coronary artery without angina pectoris: Secondary | ICD-10-CM | POA: Diagnosis not present

## 2022-04-03 DIAGNOSIS — R072 Precordial pain: Secondary | ICD-10-CM

## 2022-04-03 DIAGNOSIS — E7800 Pure hypercholesterolemia, unspecified: Secondary | ICD-10-CM | POA: Diagnosis not present

## 2022-04-03 MED ORDER — AMLODIPINE BESYLATE 2.5 MG PO TABS: 2.5000 mg | ORAL_TABLET | Freq: Every day | ORAL | 3 refills | Status: DC

## 2022-04-03 MED ORDER — SODIUM CHLORIDE 0.9% FLUSH: 3.0000 mL | Freq: Two times a day (BID) | INTRAVENOUS | Status: DC

## 2022-04-03 NOTE — Progress Notes (Signed)
HPI: FU coronary artery disease. The patient had an inferior myocardial infarction in 1999 at age 51. He had a stent to the right coronary artery at that time. Admitted with non-ST elevation myocardial infarction in January 2015. The LAD was occluded (chronic); the right coronary was also occluded. Ejection fraction 45-55%. The patient had PCI of his right coronary artery and PDA. Echocardiogram January 2015 showed ejection fraction 50-55% and grade 1 diastolic dysfunction. Mild mitral regurgitation.  Chest CT March 2023 showed thyroid nodule, coronary calcification and emphysema.  Recently diagnosed with medullary thyroid cancer; total thyroidectomy planned.  Since I last saw him, patient states that for the past year he has had occasional throat tightness similar to his infarct pain.  He notices this particularly with elevation in heart rates (with sex and occasionally with walking).  He notices it after eating with activities as well.  He states occasionally it improves with belching.  He denies dyspnea or syncope.  Current Outpatient Medications  Medication Sig Dispense Refill   aspirin 81 MG tablet Take 81 mg by mouth daily.      atorvastatin (LIPITOR) 80 MG tablet Take 0.5 tablets (40 mg total) by mouth daily. 90 tablet 3   ezetimibe (ZETIA) 10 MG tablet Take 1 tablet (10 mg total) by mouth daily. 90 tablet 3   hydrocodone-ibuprofen (VICOPROFEN) 5-200 MG tablet Take 1 tablet by mouth every 8 (eight) hours as needed for pain. 15 tablet 0   metoprolol succinate (TOPROL-XL) 25 MG 24 hr tablet Take 1 tablet (25 mg total) by mouth daily. 90 tablet 3   nitroGLYCERIN (NITROSTAT) 0.4 MG SL tablet Dissolve 1 tablet under the tongue every 5 minutes as  needed for chest pain 25 tablet 0   phenazopyridine (PYRIDIUM) 100 MG tablet Take 1 tablet (100 mg total) by mouth 3 (three) times daily with meals. 21 tablet 0   tamsulosin (FLOMAX) 0.4 MG CAPS capsule Take 1 capsule (0.4 mg total) by mouth daily. 14  capsule 0   No current facility-administered medications for this visit.     Past Medical History:  Diagnosis Date   CAD (coronary artery disease)    a. s/p MI @ age 4 with PCI of the RCA;  b. CTO of the LAD;  c. 11/2013 NSTEMI/PCI: LM nl, LAD 100 L->L collats, LCX 50p, OM2 80p, OM1 60ost, RCA 100p (3.5x38 Promus DES), RPL 99/25mPTCA only, EF 40-50%;  c. 11/2013 Echo: EF 50-55%, Gr1 DD, mild MR.   HYPERLIPIDEMIA    HYPERTENSION    Kidney stone 11/2021   MITRAL REGURGITATION    Myocardial infarction ([facility]    at age [age]  Tobacco abuse     Past Surgical History:  Procedure Laterality Date   COLONOSCOPY  02/12/2022   CORONARY STENT PLACEMENT     LEFT HEART CATHETERIZATION WITH CORONARY ANGIOGRAM N/A 12/01/2013   Procedure: LEFT HEART CATHETERIZATION WITH CORONARY ANGIOGRAM;  Surgeon: DLeonie Man MD;  [facility];  Service: Cardiovascular;  Laterality: N/A;   LITHOTRIPSY  2003    Social History   Socioeconomic History   Marital status: Divorced    Spouse name: Not on file   Number of children: 2   Years of education: Not on file   Highest education level: Bachelor's degree (e.g., BA, AB, BS)  Occupational History   Occupation: SHospital doctor   Comment: [employer] Tobacco Use   Smoking status: Former    Types: Cigarettes  Quit date: 2021    Years since quitting: 2.3    Passive exposure: Past (younger)   Smokeless tobacco: Never   Tobacco comments:    Nicotine pouches- 10/day--no tobacco  Vaping Use   Vaping Use: Former   Quit date: 11/25/2021  Substance and Sexual Activity   Alcohol use: Yes    Alcohol/week: 2.0 standard drinks    Types: 2 Cans of beer per week    Comment: socially   Drug use: Yes    Types: Marijuana    Comment: hemp products,occasionally   Sexual activity: Yes  Other Topics Concern   Not on file  Social History Narrative   Not on file   Social Determinants of Health   Financial Resource Strain: Not on file   Food Insecurity: Not on file  Transportation Needs: Not on file  Physical Activity: Not on file  Stress: Not on file  Social Connections: Not on file  Intimate Partner Violence: Not on file    Family History  Problem Relation Age of Onset   Cancer Mother        Thyms   Heart disease Mother    Hypertension Mother    COPD Mother    Cancer Father        skin, prostrate   Diabetes Father        Type 1   Heart disease Father    Hypertension Father    Cancer Sister        Skin   Coronary artery disease Other    Colon cancer Neg Hx    Colon polyps Neg Hx    Esophageal cancer Neg Hx    Rectal cancer Neg Hx    Stomach cancer Neg Hx     ROS: no fevers or chills, productive cough, hemoptysis, dysphasia, odynophagia, melena, hematochezia, dysuria, hematuria, rash, seizure activity, orthopnea, PND, pedal edema, claudication. Remaining systems are negative.  Physical Exam: Well-developed well-nourished in no acute distress.  Skin is warm and dry.  HEENT is normal.  Neck is supple.  Chest is clear to auscultation with normal expansion.  Cardiovascular exam is regular rate and rhythm.  Abdominal exam nontender or distended. No masses palpated. Extremities show no edema. neuro grossly intact  ECG-Mar 29, 2022-sinus rhythm, left ventricular hypertrophy with repolarization abnormality, inferior infarct.  Personally reviewed  A/P  1 coronary artery disease- Continue aspirin and statin.  2 hyperlipidemia-continue present dose of Lipitor and Zetia.  3 hypertension-blood pressure controlled.  Continue present medications.  4 angina-patient is describing symptoms similar to his previous infarct pain.  It occurs when his heart rate is elevated predominately with activities.  However there is some atypical features as he feels it may improve with belching.  However he is scheduled to have a thyroidectomy and I would like definitive evaluation prior to proceeding.  We will arrange  cardiac catheterization.  The risk and benefits including myocardial infarction, CVA and death discussed and he agrees to proceed.  If he requires PCI may need to delay thyroidectomy.  We will add amlodipine 2.5 mg daily as antianginal therapy.  5 preoperative evaluation prior to thyroidectomy-as above given his symptoms we will plan cardiac catheterization.  If results unchanged compared to previous he may proceed.  Kirk Ruths, MD

## 2022-04-03 NOTE — Patient Instructions (Signed)
  [address] [place] [address] Dept: [phone] Loc: [place]  04/03/2022  You are scheduled for a Cardiac Catheterization on Tuesday, May 30 with Dr. Larae Grooms.  1. Please arrive at the Main Entrance A at [facility]: [place], [place] [address] at 7:00 AM (This time is two hours before your procedure to ensure your preparation). Free valet parking service is available.   Special note: Every effort is made to have your procedure done on time. Please understand that emergencies sometimes delay scheduled procedures.  2. Diet: Do not eat solid foods after midnight.  You may have clear liquids until 5 AM upon the day of the procedure.  3. Labs: You will need to have blood drawn on TODAY  4. Medication instructions in preparation for your procedure:  START AMLODIPINE 2.5 MG ONCE DAILY  On the morning of your procedure, take Aspirin and any morning medicines NOT listed above.  You may use sips of water.  5. Plan to go home the same day, you will only stay overnight if medically necessary. 6. You MUST have a responsible adult to drive you home. 7. An adult MUST be with you the first 24 hours after you arrive home. 8. Bring a current list of your medications, and the last time and date medication taken. 9. Bring ID and current insurance cards. 10.Please wear clothes that are easy to get on and off and wear slip-on shoes.  Thank you for allowing [place] to care for you!   -- [facility]  Your physician recommends that you schedule a follow-up appointment in: [place]

## 2022-04-04 ENCOUNTER — Telehealth: Payer: Self-pay | Admitting: *Deleted

## 2022-04-04 LAB — BASIC METABOLIC PANEL
BUN/Creatinine Ratio: 10 (ref 9–20)
BUN: 9 mg/dL (ref 6–24)
CO2: 24 mmol/L (ref 20–29)
Calcium: 9.5 mg/dL (ref 8.7–10.2)
Chloride: 103 mmol/L (ref 96–106)
Creatinine, Ser: 0.92 mg/dL (ref 0.76–1.27)
Glucose: 87 mg/dL (ref 70–99)
Potassium: 4.3 mmol/L (ref 3.5–5.2)
Sodium: 142 mmol/L (ref 134–144)
eGFR: 99 mL/min/{1.73_m2} (ref >59)

## 2022-04-04 LAB — CBC
Hematocrit: 43.2 % (ref 37.5–51.0)
Hemoglobin: 15.4 g/dL (ref 13.0–17.7)
MCH: 30.9 pg (ref 26.6–33.0)
MCHC: 35.6 g/dL (ref 31.5–35.7)
MCV: 87 fL (ref 79–97)
Platelets: 221 10*3/uL (ref 150–450)
RBC: 4.98 x10E6/uL (ref 4.14–5.80)
RDW: 12.7 % (ref 11.6–15.4)
WBC: 8.3 10*3/uL (ref 3.4–10.8)

## 2022-04-04 NOTE — Telephone Encounter (Signed)
Cardiac Catheterization scheduled at [facility] for: Tuesday Apr 09, 2022 9 AM Arrival time and place: [facility] Main Entrance A at: 7 AM   Nothing to eat after midnight prior to procedure, clear liquids until 5 AM day of procedure.  Medication instructions: -Usual morning medications can be taken with sips of water including aspirin 81 mg.  Confirmed patient has responsible adult to drive home post procedure and be with patient first 24 hours after arriving home.  Patient reports no new symptoms concerning for COVID-19/no exposure to COVID-19 in the past 10 days.   Reviewed procedure instructions with patient.

## 2022-04-05 MED ORDER — HYDROCODONE-IBUPROFEN 5-200 MG PO TABS: 1.0000 | ORAL_TABLET | Freq: Three times a day (TID) | ORAL | 0 refills | Status: DC | PRN

## 2022-04-05 NOTE — Addendum Note (Signed)
Addended by: Haydee Salter on: 04/05/2022 05:47 PM   Modules accepted: Orders

## 2022-04-06 ENCOUNTER — Other Ambulatory Visit: Payer: Self-pay

## 2022-04-06 ENCOUNTER — Inpatient Hospital Stay (HOSPITAL_COMMUNITY)
Admission: EM | Admit: 2022-04-06 | Discharge: 2022-04-17 | DRG: 234 | Disposition: A | Discharge status: Home / Self Care | Payer: BC Managed Care – PPO | Attending: Cardiothoracic Surgery | Admitting: Cardiothoracic Surgery

## 2022-04-06 ENCOUNTER — Emergency Department (HOSPITAL_COMMUNITY): Payer: BC Managed Care – PPO

## 2022-04-06 ENCOUNTER — Encounter (HOSPITAL_COMMUNITY): Payer: Self-pay

## 2022-04-06 DIAGNOSIS — Z951 Presence of aortocoronary bypass graft: Secondary | ICD-10-CM

## 2022-04-06 DIAGNOSIS — Z79899 Other long term (current) drug therapy: Secondary | ICD-10-CM

## 2022-04-06 DIAGNOSIS — I701 Atherosclerosis of renal artery: Secondary | ICD-10-CM | POA: Diagnosis not present

## 2022-04-06 DIAGNOSIS — K551 Chronic vascular disorders of intestine: Secondary | ICD-10-CM | POA: Diagnosis present

## 2022-04-06 DIAGNOSIS — I7090 Unspecified atherosclerosis: Secondary | ICD-10-CM

## 2022-04-06 DIAGNOSIS — D6959 Other secondary thrombocytopenia: Secondary | ICD-10-CM | POA: Diagnosis not present

## 2022-04-06 DIAGNOSIS — N200 Calculus of kidney: Secondary | ICD-10-CM

## 2022-04-06 DIAGNOSIS — K5900 Constipation, unspecified: Secondary | ICD-10-CM | POA: Diagnosis not present

## 2022-04-06 DIAGNOSIS — R778 Other specified abnormalities of plasma proteins: Secondary | ICD-10-CM | POA: Diagnosis not present

## 2022-04-06 DIAGNOSIS — I2510 Atherosclerotic heart disease of native coronary artery without angina pectoris: Secondary | ICD-10-CM | POA: Diagnosis not present

## 2022-04-06 DIAGNOSIS — E7800 Pure hypercholesterolemia, unspecified: Secondary | ICD-10-CM

## 2022-04-06 DIAGNOSIS — E8770 Fluid overload, unspecified: Secondary | ICD-10-CM | POA: Diagnosis not present

## 2022-04-06 DIAGNOSIS — Z7982 Long term (current) use of aspirin: Secondary | ICD-10-CM

## 2022-04-06 DIAGNOSIS — Z955 Presence of coronary angioplasty implant and graft: Secondary | ICD-10-CM

## 2022-04-06 DIAGNOSIS — E785 Hyperlipidemia, unspecified: Secondary | ICD-10-CM | POA: Diagnosis not present

## 2022-04-06 DIAGNOSIS — R55 Syncope and collapse: Secondary | ICD-10-CM | POA: Diagnosis present

## 2022-04-06 DIAGNOSIS — D62 Acute posthemorrhagic anemia: Secondary | ICD-10-CM | POA: Diagnosis not present

## 2022-04-06 DIAGNOSIS — Z833 Family history of diabetes mellitus: Secondary | ICD-10-CM

## 2022-04-06 DIAGNOSIS — I10 Essential (primary) hypertension: Secondary | ICD-10-CM | POA: Diagnosis present

## 2022-04-06 DIAGNOSIS — J439 Emphysema, unspecified: Secondary | ICD-10-CM | POA: Diagnosis present

## 2022-04-06 DIAGNOSIS — R9431 Abnormal electrocardiogram [ECG] [EKG]: Secondary | ICD-10-CM | POA: Diagnosis not present

## 2022-04-06 DIAGNOSIS — R7303 Prediabetes: Secondary | ICD-10-CM | POA: Diagnosis present

## 2022-04-06 DIAGNOSIS — I214 Non-ST elevation (NSTEMI) myocardial infarction: Secondary | ICD-10-CM | POA: Diagnosis not present

## 2022-04-06 DIAGNOSIS — C73 Malignant neoplasm of thyroid gland: Secondary | ICD-10-CM | POA: Diagnosis present

## 2022-04-06 DIAGNOSIS — G4700 Insomnia, unspecified: Secondary | ICD-10-CM | POA: Diagnosis present

## 2022-04-06 DIAGNOSIS — R000 Tachycardia, unspecified: Secondary | ICD-10-CM | POA: Diagnosis not present

## 2022-04-06 DIAGNOSIS — R918 Other nonspecific abnormal finding of lung field: Secondary | ICD-10-CM

## 2022-04-06 DIAGNOSIS — I252 Old myocardial infarction: Secondary | ICD-10-CM

## 2022-04-06 DIAGNOSIS — R32 Unspecified urinary incontinence: Secondary | ICD-10-CM | POA: Diagnosis present

## 2022-04-06 DIAGNOSIS — I672 Cerebral atherosclerosis: Secondary | ICD-10-CM | POA: Diagnosis not present

## 2022-04-06 DIAGNOSIS — I700 Atherosclerosis of aorta: Secondary | ICD-10-CM | POA: Diagnosis not present

## 2022-04-06 DIAGNOSIS — Z8249 Family history of ischemic heart disease and other diseases of the circulatory system: Secondary | ICD-10-CM

## 2022-04-06 DIAGNOSIS — Z87891 Personal history of nicotine dependence: Secondary | ICD-10-CM

## 2022-04-06 DIAGNOSIS — Z8042 Family history of malignant neoplasm of prostate: Secondary | ICD-10-CM

## 2022-04-06 DIAGNOSIS — J984 Other disorders of lung: Secondary | ICD-10-CM | POA: Diagnosis not present

## 2022-04-06 DIAGNOSIS — R072 Precordial pain: Secondary | ICD-10-CM

## 2022-04-06 LAB — URINALYSIS, ROUTINE W REFLEX MICROSCOPIC
Bacteria, UA: NONE SEEN
Bilirubin Urine: NEGATIVE
Glucose, UA: NEGATIVE mg/dL
Ketones, ur: NEGATIVE mg/dL
Leukocytes,Ua: NEGATIVE
Nitrite: NEGATIVE
Protein, ur: NEGATIVE mg/dL
Specific Gravity, Urine: 1.011 (ref 1.005–1.030)
pH: 5 (ref 5.0–8.0)

## 2022-04-06 LAB — CK: Total CK: 63 U/L (ref 49–397)

## 2022-04-06 LAB — HIV ANTIBODY (ROUTINE TESTING W REFLEX): HIV Screen 4th Generation wRfx: NONREACTIVE

## 2022-04-06 LAB — COMPREHENSIVE METABOLIC PANEL
ALT: 37 U/L (ref 0–44)
AST: 31 U/L (ref 15–41)
Albumin: 4 g/dL (ref 3.5–5.0)
Alkaline Phosphatase: 51 U/L (ref 38–126)
Anion gap: 8 (ref 5–15)
BUN: 12 mg/dL (ref 6–20)
CO2: 25 mmol/L (ref 22–32)
Calcium: 8.9 mg/dL (ref 8.9–10.3)
Chloride: 103 mmol/L (ref 98–111)
Creatinine, Ser: 0.95 mg/dL (ref 0.61–1.24)
GFR, Estimated: >60 mL/min (ref >60)
Glucose, Bld: 119 mg/dL — ABNORMAL HIGH (ref 70–99)
Potassium: 3.8 mmol/L (ref 3.5–5.1)
Sodium: 136 mmol/L (ref 135–145)
Total Bilirubin: 0.9 mg/dL (ref 0.3–1.2)
Total Protein: 6.4 g/dL — ABNORMAL LOW (ref 6.5–8.1)

## 2022-04-06 LAB — CBC WITH DIFFERENTIAL/PLATELET
Abs Immature Granulocytes: 0.06 10*3/uL (ref 0.00–0.07)
Basophils Absolute: 0 10*3/uL (ref 0.0–0.1)
Basophils Relative: 0 %
Eosinophils Absolute: 0.1 10*3/uL (ref 0.0–0.5)
Eosinophils Relative: 1 %
HCT: 36.1 % — ABNORMAL LOW (ref 39.0–52.0)
Hemoglobin: 13 g/dL (ref 13.0–17.0)
Immature Granulocytes: 1 %
Lymphocytes Relative: 8 %
Lymphs Abs: 1 10*3/uL (ref 0.7–4.0)
MCH: 31 pg (ref 26.0–34.0)
MCHC: 36 g/dL (ref 30.0–36.0)
MCV: 86.2 fL (ref 80.0–100.0)
Monocytes Absolute: 0.7 10*3/uL (ref 0.1–1.0)
Monocytes Relative: 5 %
Neutro Abs: 10.9 10*3/uL — ABNORMAL HIGH (ref 1.7–7.7)
Neutrophils Relative %: 85 %
Platelets: 152 10*3/uL (ref 150–400)
RBC: 4.19 MIL/uL — ABNORMAL LOW (ref 4.22–5.81)
RDW: 11.9 % (ref 11.5–15.5)
WBC: 12.7 10*3/uL — ABNORMAL HIGH (ref 4.0–10.5)
nRBC: 0 % (ref 0.0–0.2)

## 2022-04-06 LAB — D-DIMER, QUANTITATIVE: D-Dimer, Quant: <0.27 ug/mL-FEU (ref 0.00–0.50)

## 2022-04-06 LAB — PROCALCITONIN: Procalcitonin: 38.1 ng/mL

## 2022-04-06 LAB — LIPASE, BLOOD: Lipase: 29 U/L (ref 11–51)

## 2022-04-06 LAB — TROPONIN I (HIGH SENSITIVITY)
Troponin I (High Sensitivity): 25 ng/L — ABNORMAL HIGH (ref <18)
Troponin I (High Sensitivity): 186 ng/L (ref <18)
Troponin I (High Sensitivity): 659 ng/L (ref <18)
Troponin I (High Sensitivity): 1433 ng/L (ref <18)

## 2022-04-06 MED ORDER — ASPIRIN 81 MG PO CHEW: 324.0000 mg | CHEWABLE_TABLET | Freq: Once | ORAL | Status: DC

## 2022-04-06 MED ORDER — NITROGLYCERIN IN D5W 200-5 MCG/ML-% IV SOLN: 0.0000 ug/min | INTRAVENOUS | Status: DC

## 2022-04-06 MED ORDER — HEPARIN BOLUS VIA INFUSION
4000.0000 [IU] | Freq: Once | INTRAVENOUS | Status: AC
Start: 2022-04-06 — End: 2022-04-06
  Administered 2022-04-06: 4000 [IU] via INTRAVENOUS
  Filled 2022-04-06: qty 4000

## 2022-04-06 MED ORDER — ATORVASTATIN CALCIUM 80 MG PO TABS
80.0000 mg | ORAL_TABLET | Freq: Every day | ORAL | Status: DC
Start: 2022-04-07 — End: 2022-04-17
  Administered 2022-04-07 – 2022-04-17 (×10): 80 mg via ORAL
  Filled 2022-04-06 (×10): qty 1

## 2022-04-06 MED ORDER — AMLODIPINE BESYLATE 2.5 MG PO TABS
2.5000 mg | ORAL_TABLET | Freq: Every day | ORAL | Status: DC
  Administered 2022-04-07 – 2022-04-10 (×4): 2.5 mg via ORAL
  Filled 2022-04-06 (×5): qty 1

## 2022-04-06 MED ORDER — SODIUM CHLORIDE 0.9% FLUSH: 3.0000 mL | Freq: Two times a day (BID) | INTRAVENOUS | Status: DC

## 2022-04-06 MED ORDER — NITROGLYCERIN 0.4 MG SL SUBL: 0.4000 mg | SUBLINGUAL_TABLET | SUBLINGUAL | Status: DC | PRN

## 2022-04-06 MED ORDER — HEPARIN (PORCINE) 25000 UT/250ML-% IV SOLN
1000.0000 [IU]/h | INTRAVENOUS | Status: DC
  Administered 2022-04-06: 900 [IU]/h via INTRAVENOUS
  Administered 2022-04-07: 1050 [IU]/h via INTRAVENOUS
  Administered 2022-04-08: 1000 [IU]/h via INTRAVENOUS
  Filled 2022-04-06 (×3): qty 250

## 2022-04-06 MED ORDER — SODIUM CHLORIDE 0.9% FLUSH
3.0000 mL | Freq: Two times a day (BID) | INTRAVENOUS | Status: DC
  Administered 2022-04-07 – 2022-04-08 (×2): 3 mL via INTRAVENOUS

## 2022-04-06 MED ORDER — SODIUM CHLORIDE 0.9% FLUSH: 3.0000 mL | INTRAVENOUS | Status: DC | PRN

## 2022-04-06 MED ORDER — SODIUM CHLORIDE 0.9 % IV SOLN: 250.0000 mL | INTRAVENOUS | Status: DC | PRN

## 2022-04-06 MED ORDER — IOHEXOL 350 MG/ML SOLN
100.0000 mL | Freq: Once | INTRAVENOUS | Status: AC | PRN
  Administered 2022-04-06: 100 mL via INTRAVENOUS

## 2022-04-06 MED ORDER — ACETAMINOPHEN 650 MG RE SUPP: 650.0000 mg | Freq: Four times a day (QID) | RECTAL | Status: DC | PRN

## 2022-04-06 MED ORDER — ASPIRIN 81 MG PO TBEC
81.0000 mg | DELAYED_RELEASE_TABLET | Freq: Every day | ORAL | Status: DC
  Administered 2022-04-07 – 2022-04-11 (×4): 81 mg via ORAL
  Filled 2022-04-06 (×5): qty 1

## 2022-04-06 MED ORDER — METOPROLOL SUCCINATE ER 25 MG PO TB24
25.0000 mg | ORAL_TABLET | Freq: Every day | ORAL | Status: DC
  Administered 2022-04-06 – 2022-04-07 (×2): 25 mg via ORAL
  Filled 2022-04-06 (×3): qty 1

## 2022-04-06 MED ORDER — SODIUM CHLORIDE 0.9 % IV BOLUS
1000.0000 mL | Freq: Once | INTRAVENOUS | Status: AC
  Administered 2022-04-06: 1000 mL via INTRAVENOUS

## 2022-04-06 MED ORDER — SODIUM CHLORIDE 0.9 % IV SOLN
1.5000 g | Freq: Four times a day (QID) | INTRAVENOUS | Status: DC
  Administered 2022-04-06 – 2022-04-12 (×23): 1.5 g via INTRAVENOUS
  Filled 2022-04-06 (×25): qty 4

## 2022-04-06 MED ORDER — LACTATED RINGERS IV BOLUS
1000.0000 mL | Freq: Once | INTRAVENOUS | Status: AC
  Administered 2022-04-06: 1000 mL via INTRAVENOUS

## 2022-04-06 MED ORDER — ACETAMINOPHEN 325 MG PO TABS
650.0000 mg | ORAL_TABLET | Freq: Four times a day (QID) | ORAL | Status: DC | PRN
  Administered 2022-04-08 – 2022-04-09 (×2): 650 mg via ORAL
  Filled 2022-04-06 (×2): qty 2

## 2022-04-06 MED ORDER — EZETIMIBE 10 MG PO TABS
10.0000 mg | ORAL_TABLET | Freq: Every day | ORAL | Status: DC
Start: 2022-04-07 — End: 2022-04-17
  Administered 2022-04-07 – 2022-04-17 (×10): 10 mg via ORAL
  Filled 2022-04-06 (×10): qty 1

## 2022-04-06 NOTE — Plan of Care (Signed)
  Problem: Education: Goal: Knowledge of General Education information will improve Description: Including pain rating scale, medication(s)/side effects and non-pharmacologic comfort measures Outcome: Progressing   Problem: Health Behavior/Discharge Planning: Goal: Ability to manage health-related needs will improve Outcome: Progressing   Problem: Clinical Measurements: Goal: Will remain free from infection Outcome: Progressing   

## 2022-04-06 NOTE — H&P (Signed)
History and Physical    Patient: Austin Vincent DOB: [birth year] DOA: 04/06/2022 DOS: the patient was seen and examined on 04/06/2022 PCP: Haydee Salter, MD  Patient coming from: Home - lives with his girlfriend and 2 sons   Chief Complaint: syncope   HPI: Austin Vincent is a 51 y.o. male with medical history significant of CAD, HTN, HLD, tobacco abuse, recent finding of medullary thyroid cancer here with complaints of possible syncope while driving.  He was driving and all of a sudden he had nausea, clammy and tingling. He thought he could make it home as he was in his neighborhood and the next thing he knows someone was knocking at his window. He thinks he may have urinated on himself, but unsure as he was so sweaty,  but no tongue biting. NO confusion. He made it home and then called EMS. He denies any prodromal chest pain or palpitations.   He has been feeling good. Denies any fever/chills, vision changes/headaches, chest pain or palpitations, shortness of breath or cough, abdominal pain, N/V/D, dysuria or leg swelling.   He does not currently smoke, occasional alcohol   He also has a known kidney stone he is trying to pass since January of this year. Hasn't seen urology for this. Unsure if he passed one yesterday as he had pain urinating yesterday and had bloody mass come out in strainer. He is pain free now.   ER Course:  vitals: afebrile, bp: 169/88, HR: 93, RR: 15, oxygen: 98%RA Pertinent labs: wbc: 12.7, troponin 25,  CT head: no acute finding  CTA chest/abdo/pelvis: no evidence of dissection. Clustered nodular consolidation within the right upper lobe with surrounding ground-glass nodules identified. Nodules measure up to 12 mm. These are new compared with the lung cancer screening CT from 01/23/2022 and is favored to represent acute infectious process. Extensive abdominopelvic aortic atherosclerotic disease with branch vessel involvement. Areas of  noncritical stenosis are noted involving the superior mesenteric artery, inferior mesenteric artery, and right renal artery In ED: cardiology consulted and patient started on unasyn for possible aspiration pneumonia.     Review of Systems: As mentioned in the history of present illness. All other systems reviewed and are negative. Past Medical History:  Diagnosis Date   CAD (coronary artery disease)    a. s/p MI @ age 50 with PCI of the RCA;  b. CTO of the LAD;  c. 11/2013 NSTEMI/PCI: LM nl, LAD 100 L->L collats, LCX 50p, OM2 80p, OM1 60ost, RCA 100p (3.5x38 Promus DES), RPL 99/44m PTCA only, EF 40-50%;  c. 11/2013 Echo: EF 50-55%, Gr1 DD, mild MR.   HYPERLIPIDEMIA    HYPERTENSION    Kidney stone 11/2021   MITRAL REGURGITATION    Myocardial infarction [facility])    at age [age]   Tobacco abuse    Past Surgical History:  Procedure Laterality Date   COLONOSCOPY  02/12/2022   CORONARY STENT PLACEMENT     LEFT HEART CATHETERIZATION WITH CORONARY ANGIOGRAM N/A 12/01/2013   Procedure: LEFT HEART CATHETERIZATION WITH CORONARY ANGIOGRAM;  Surgeon: Leonie Man, MD;  Location: [facility] CATH LAB;  Service: Cardiovascular;  Laterality: N/A;   LITHOTRIPSY  2003   Social History:  reports that he quit smoking about 2 years ago. His smoking use included cigarettes. He has been exposed to tobacco smoke. He has never used smokeless tobacco. He reports current alcohol use of about 2.0 standard drinks per week. He reports current drug use. Drug: Marijuana.  No  Known Allergies  Family History  Problem Relation Age of Onset   Cancer Mother        Thyms   Heart disease Mother    Hypertension Mother    COPD Mother    Cancer Father        skin, prostrate   Diabetes Father        Type 1   Heart disease Father    Hypertension Father    Cancer Sister        Skin   Coronary artery disease Other    Colon cancer Neg Hx    Colon polyps Neg Hx    Esophageal cancer Neg Hx    Rectal cancer Neg Hx    Stomach  cancer Neg Hx     Prior to Admission medications   Medication Sig Start Date End Date Taking? Authorizing Provider  amLODipine (NORVASC) 2.5 MG tablet Take 1 tablet (2.5 mg total) by mouth daily. 04/03/22  Yes Lelon Perla, MD  aspirin 81 MG tablet Take 81 mg by mouth daily.    Yes [provider]  atorvastatin (LIPITOR) 80 MG tablet Take 0.5 tablets (40 mg total) by mouth daily. Patient taking differently: Take 80 mg by mouth daily. 12/31/21  Yes Haydee Salter, MD  ezetimibe (ZETIA) 10 MG tablet Take 1 tablet (10 mg total) by mouth daily. 07/24/20  Yes Lelon Perla, MD  hydrocodone-ibuprofen (VICOPROFEN) 5-200 MG tablet Take 1 tablet by mouth every 8 (eight) hours as needed for pain. 04/05/22  Yes Haydee Salter, MD  nitroGLYCERIN (NITROSTAT) 0.4 MG SL tablet Dissolve 1 tablet under the tongue every 5 minutes as  needed for chest pain Patient taking differently: 0.4 mg every 5 (five) minutes as needed for chest pain. 05/16/15  Yes Lelon Perla, MD  Simethicone (GAS-X PO) Take 1 tablet by mouth daily as needed (gas).   Yes [provider]  tamsulosin (FLOMAX) 0.4 MG CAPS capsule Take 1 capsule (0.4 mg total) by mouth daily. Patient taking differently: Take 0.4 mg by mouth daily as needed (when needed to pass urine). 01/04/22  Yes Haydee Salter, MD  metoprolol succinate (TOPROL-XL) 25 MG 24 hr tablet Take 1 tablet (25 mg total) by mouth daily. 07/24/20   Lelon Perla, MD  phenazopyridine (PYRIDIUM) 100 MG tablet Take 1 tablet (100 mg total) by mouth 3 (three) times daily with meals. Patient not taking: Reported on 04/04/2022 03/15/22   Haydee Salter, MD    Physical Exam: Vitals:   04/06/22 1715 04/06/22 1735 04/06/22 1745 04/06/22 1900  BP: 122/85 129/88 126/80 124/66  Pulse: 80 85 74 65  Resp: 13   14  Temp:      TempSrc:      SpO2: 97%  98% 96%  Weight:      Height:       General:  Appears calm and comfortable and is in NAD Eyes:  PERRL, EOMI,  normal lids, iris ENT:  grossly normal hearing, lips & tongue, mmm; appropriate dentition Neck:  no LAD, masses or thyromegaly; no carotid bruits, no JVD Cardiovascular:  RRR, +systolic murmur. No LE edema.  Respiratory:   CTA bilaterally with no wheezes/rales/rhonchi.  Normal respiratory effort. Abdomen:  soft, NT, ND, NABS Back:   normal alignment, no CVAT Skin:  no rash or induration seen on limited exam Musculoskeletal:  grossly normal tone BUE/BLE, good ROM, no bony abnormality Lower extremity:  No LE edema.  Limited foot exam with  no ulcerations.  2+ distal pulses. Psychiatric:  grossly normal mood and affect, speech fluent and appropriate, AOx3 Neurologic:  CN 2-12 grossly intact, moves all extremities in coordinated fashion, sensation intact   Radiological Exams on Admission: Independently reviewed - see discussion in A/P where applicable  CT Head Wo Contrast  Result Date: 04/06/2022 CLINICAL DATA:  Syncope/presyncope, cerebrovascular cause suspected EXAM: CT HEAD WITHOUT CONTRAST TECHNIQUE: Contiguous axial images were obtained from the base of the skull through the vertex without intravenous contrast. RADIATION DOSE REDUCTION: This exam was performed according to the departmental dose-optimization program which includes automated exposure control, adjustment of the mA and/or kV according to patient size and/or use of iterative reconstruction technique. COMPARISON:  None Available. FINDINGS: Brain: No evidence of acute infarction, hemorrhage, hydrocephalus, extra-axial collection or mass lesion/mass effect. Vascular: No hyperdense vessel identified. Calcific intracranial atherosclerosis Skull: No acute fracture. Sinuses/Orbits: Clear sinuses.  No acute orbital findings. Other: No mastoid effusions. IMPRESSION: No evidence of acute intracranial abnormality. Electronically Signed   By: Margaretha Sheffield M.D.   On: 04/06/2022 16:48   CT Angio Chest/Abd/Pel for Dissection W and/or Wo  Contrast  Result Date: 04/06/2022 CLINICAL DATA:  Acute aortic syndrome suspected. EXAM: CT ANGIOGRAPHY CHEST, ABDOMEN AND PELVIS TECHNIQUE: Non-contrast CT of the chest was initially obtained. Multidetector CT imaging through the chest, abdomen and pelvis was performed using the standard protocol during bolus administration of intravenous contrast. Multiplanar reconstructed images and MIPs were obtained and reviewed to evaluate the vascular anatomy. RADIATION DOSE REDUCTION: This exam was performed according to the departmental dose-optimization program which includes automated exposure control, adjustment of the mA and/or kV according to patient size and/or use of iterative reconstruction technique. CONTRAST:  157mL OMNIPAQUE IOHEXOL 350 MG/ML SOLN COMPARISON:  Lung cancer screening CT from 01/23/2022 and CT AP from 03/25/2022. FINDINGS: CTA CHEST FINDINGS Cardiovascular: Preferential opacification of the thoracic aorta. No evidence of thoracic aortic aneurysm or dissection. Mild aortic atherosclerosis. Coronary artery calcifications. Normal heart size. No pericardial effusion. Mediastinum/Nodes: 2.2 cm right lobe of thyroid gland nodule, image 11/7. This has been evaluated on previous imaging. (ref: J Am Coll Radiol. 2015 Feb;12(2): 143-50).The trachea appears patent and midline. Normal appearance of the esophagus. No enlarged supraclavicular, axillary, mediastinal or hilar lymph nodes. Lungs/Pleura: There is no pleural effusion. No atelectasis or pneumothorax. Within the right upper lobe there is a nodular area of consolidation measuring 1.7 x 1.1 cm. Adjacent subpleural nodular density measures 1.6 by 0.8 cm. There are additional, smaller nodules including multiple clustered tree-in-bud nodules identified. Musculoskeletal: No chest wall abnormality. No acute or significant osseous findings. Review of the MIP images confirms the above findings. CTA ABDOMEN AND PELVIS FINDINGS VASCULAR Aorta: Extensive  calcified and noncalcified atherosclerotic plaque noted. Normal caliber aorta without aneurysm, dissection, vasculitis or significant stenosis. Celiac: Patent without evidence of aneurysm, dissection, vasculitis or significant stenosis. SMA: The SME scratch set SMA appears patent. However, there is noncalcified plaque within the proximal SMA which results and focal luminal narrowing of approximately 50%, image 68/12. Calcified and noncalcified plaque Renals: Is noted at the origin of both renal arteries. The scratch set no significant stenosis of the left renal artery. Approximately 20% stenosis of the proximal right renal artery noted. IMA: Patent. Noncalcified plaque noted at the origin of the IMA with approximately 20% stenosis. Inflow: Patent without evidence of aneurysm, dissection, vasculitis or significant stenosis. Veins: No obvious venous abnormality within the limitations of this arterial phase study. Review of the MIP images confirms  the above findings. NON-VASCULAR Hepatobiliary: No focal liver abnormality is seen. No gallstones, gallbladder wall thickening, or biliary dilatation. Pancreas: Unremarkable. No pancreatic ductal dilatation or surrounding inflammatory changes. Spleen: Normal in size without focal abnormality. Adrenals/Urinary Tract: Normal adrenal glands. No nephrolithiasis, hydronephrosis or mass identified. Urinary bladder appears unremarkable. Stomach/Bowel: Stomach is within normal limits. Appendix appears normal. No evidence of bowel wall thickening, distention, or inflammatory changes. Lymphatic: No abdominal or pelvic adenopathy identified. Reproductive: Prostate is unremarkable. Other: No free fluid or fluid collections. Musculoskeletal: No acute or significant osseous findings. Review of the MIP images confirms the above findings. IMPRESSION: 1. No evidence for aortic aneurysm or dissection. 2. Coronary artery calcifications noted. 3. Clustered nodular consolidation within the right  upper lobe with surrounding ground-glass nodules identified. Nodules measure up to 12 mm. These are new compared with the lung cancer screening CT from 01/23/2022 and is favored to represent acute infectious process. According to consensus criteria consider a non-contrast Chest CT at 3 months, a PET/CT, or tissue sampling. These guidelines do not apply to immunocompromised patients and patients with cancer. Follow up in patients with significant comorbidities as clinically warranted. For lung cancer screening, adhere to Lung-RADS guidelines. Reference: Radiology. 2017; 284(1):228-43. 4. Extensive abdominopelvic aortic atherosclerotic disease with branch vessel involvement. Areas of noncritical stenosis are noted involving the superior mesenteric artery, inferior mesenteric artery, and right renal artery. 5. Coronary artery calcifications identified. Aortic Atherosclerosis (ICD10-I70.0). Electronically Signed   By: Kerby Moors M.D.   On: 04/06/2022 17:06    EKG: Independently reviewed.  NSR with rate 85; nonspecific ST changes with no evidence of acute ischemia. New T wave inversions inferior/lateral. ST elevation new from prior ekg; however, STEMI criteria not met. Edp discussed with cardiology on call.    Labs on Admission: I have personally reviewed the available labs and imaging studies at the time of the admission.  Pertinent labs:   : wbc: 12.7,  troponin 25>186  Assessment and Plan: Principal Problem:   Syncope Active Problems:   NSTEMI with CAD s/p multiple DES   Pulmonary nodules   Nephrolithiasis   Medullary thyroid carcinoma (HCC)   Essential hypertension   Hyperlipidemia   extensive abdominopelvic aortic atherosclerotic disease    Assessment and Plan: * Syncope 51 year old male with episode of syncope while driving found to have elevated troponin/NSTEMI -obs to tele -cardiology consulted-plan for LHC  -check orthostatics -echo pending -has been taking flomax with concern  for kidney stone. Will hold this  -cycle troponin -heparin gtt/BB/statin/ASA  -CT head with no acute finding -TED hose -no hx of seizures, no tongue biting, no confusion. Questionable incontinence vs. Sweat, low suspicion for seizure     NSTEMI with CAD s/p multiple DES Troponin 25>183 in setting of syncope, concern for NSTEMI Cardiology consulted Echo has been ordered edp has started heparin  Continue to cycle troponin  He is chest pain free  Plans for LHC on 5/30 that was already scheduled out patient. May need sooner if clinically indicated  Continue high intensity statin, beta blocker, NG prn, ASA  Hx of MI at age 84 in 23 s/p DES to RCA NSTEMI in 11/2013: The LAD was occluded (chronic); the right coronary was also occluded. Ejection fraction 45-55%. The patient had PCI of his right coronary artery and PDA.   Pulmonary nodules New finding since his lung cancer screening cT in 01/2022 -clustered nodular consolidation within the right upper lobe with surrounding ground glass nodules identified. Favored to represent  an acute infectious process.  -he has no signs or symptoms of infection and speficially denies fever/chills, shortness of breath or cough. Has mildly elevated WBC, but likely reactive -started on unasyn for possible aspiration during syncopal episdoe, will continue -check PCT, follow fever curve and trend CBC. If infectious agent ruled out needs further work up in setting of   of medullary thyroid cancer with repeat imaging/PET scan or biopsy.   Nephrolithiasis History of nephrolithiasis in past requiring lithotripsy Has been having symptoms since January CT renal stone study in 01/2022 with no acute findings/stones  Had passage of bloody clot and feels better Discussed will hold off CT study, but if pain returns would re-image him Discussed need to f/u with urology    Medullary thyroid carcinoma [facility]) Pending thyroidectomy with ENT Also will be following with  endocrinology  Essential hypertension Continue lopressor and norvasc   Hyperlipidemia Continue high intensity statin +zetia   Recent LDL <70  extensive abdominopelvic aortic atherosclerotic disease Areas of non critical stenosis involving the SMA, inferior mesenteric artery and right renal artery Continue high intensity statin, ASA      Advance Care Planning:   Code Status: Full Code   Consults: cardiology: Dr. Johney Frame   DVT Prophylaxis: heparin gtt   Family Communication: girlfriend at bedside: Austin Vincent   Severity of Illness: The appropriate patient status for this patient is OBSERVATION. Observation status is judged to be reasonable and necessary in order to provide the required intensity of service to ensure the patient's safety. The patient's presenting symptoms, physical exam findings, and initial radiographic and laboratory data in the context of their medical condition is felt to place them at decreased risk for further clinical deterioration. Furthermore, it is anticipated that the patient will be medically stable for discharge from the hospital within 2 midnights of admission.   Author: Orma Flaming, MD 04/06/2022 8:13 PM  For on call review [email or web address].

## 2022-04-06 NOTE — ED Notes (Signed)
CRITICAL VALUE STICKER  CRITICAL VALUE:trop 186  RECEIVER (on-site recipient of call):I. Kaelah Hayashi  DATE & TIME NOTIFIED: 03/27/22  MESSENGER (representative from lab):lab  MD NOTIFIED: [place]  RESPONSE:

## 2022-04-06 NOTE — ED Triage Notes (Signed)
Bib ems from home; driving vehicle, started feeling lightheaded, clammy, diaphoretic; stated he had syncopal episode behind wheel; able to make it home and call 911; initial hr 125 with ems, bigeminal PVCs which have since resolved; denies cp, sob; states he feels "uptight and anxious"; hx MI, recent diagnosis of thyroid cancer, currently being treated for kidney stones; scheduled exploratory heart cath next Tuesday; HR 93 sinus, 146/87, CBG 143, 95% RA, 20 ga lac, no meds given PTA; recently stopped metoprolol,started on amlodipine

## 2022-04-06 NOTE — ED Notes (Signed)
Pt to CT

## 2022-04-06 NOTE — Progress Notes (Signed)
ANTICOAGULATION CONSULT NOTE - Initial Consult  Pharmacy Consult for heparin Indication: chest pain/ACS  No Known Allergies  Patient Measurements: Height: '5\' 8"'$  (172.7 cm) Weight: 73.5 kg (162 lb) IBW/kg (Calculated) : 68.4 Heparin Dosing Weight: TBW  Vital Signs: Temp: 97.5 F (36.4 C) (05/27 1436) Temp Source: Oral (05/27 1436) BP: 126/80 (05/27 1745) Pulse Rate: 74 (05/27 1745)  Labs: Recent Labs    04/06/22 1433 04/06/22 1633  HGB 13.0  --   HCT 36.1*  --   PLT 152  --   CREATININE 0.95  --   CKTOTAL 63  --   TROPONINIHS 25* 186*    Estimated Creatinine Clearance: 87 mL/min (by C-G formula based on SCr of 0.95 mg/dL).   Medical History: Past Medical History:  Diagnosis Date   CAD (coronary artery disease)    a. s/p MI @ age 25 with PCI of the RCA;  b. CTO of the LAD;  c. 11/2013 NSTEMI/PCI: LM nl, LAD 100 L->L collats, LCX 50p, OM2 80p, OM1 60ost, RCA 100p (3.5x38 Promus DES), RPL 99/32mPTCA only, EF 40-50%;  c. 11/2013 Echo: EF 50-55%, Gr1 DD, mild MR.   HYPERLIPIDEMIA    HYPERTENSION    Kidney stone 11/2021   MITRAL REGURGITATION    Myocardial infarction ([facility]    at age [age]  Tobacco abuse     Assessment: [id number] presenting with near syncope, hx CAD, he is not on anticoagulation PTA, CBC wnl  Goal of Therapy:  Heparin level 0.3-0.7 units/ml Monitor platelets by anticoagulation protocol: Yes   Plan:  Heparin 4000 units IV x 1, and gtt at 900 units/hr F/u 6 hour heparin level F/u cath plans  JBertis Ruddy PharmD Clinical Pharmacist ED Pharmacist Phone # [phone]/27/2023 6:33 PM

## 2022-04-06 NOTE — Assessment & Plan Note (Signed)
Areas of non critical stenosis involving the SMA, inferior mesenteric artery and right renal artery Continue high intensity statin, ASA

## 2022-04-06 NOTE — Progress Notes (Signed)
Pharmacy Antibiotic Note  Austin Vincent is a 51 y.o. male admitted on 04/06/2022 with concern for aspiration pneumonia. Pharmacy has been consulted for unasyn dosing.  Plan: Unasyn 1.5g q6h F/u renal function, clinical course, and length of therapy  Height: '5\' 8"'$  (172.7 cm) Weight: 73.5 kg (162 lb) IBW/kg (Calculated) : 68.4  Temp (24hrs), Avg:97.5 F (36.4 C), Min:97.5 F (36.4 C), Max:97.5 F (36.4 C)  Recent Labs  Lab 04/03/22 1623 04/06/22 1433  WBC 8.3 12.7*  CREATININE 0.92 0.95    Estimated Creatinine Clearance: 87 mL/min (by C-G formula based on SCr of 0.95 mg/dL).    No Known Allergies  Antimicrobials this admission: Unasyn 5/27 >  Dose adjustments this admission:  Microbiology results:  Thank you for allowing pharmacy to be a part of this patient's care.  Levonne Spiller 04/06/2022 5:26 PM

## 2022-04-06 NOTE — Assessment & Plan Note (Addendum)
51 year old male with episode of syncope while driving found to have elevated troponin/NSTEMI -obs to tele -cardiology consulted-plan for LHC  -check orthostatics -echo pending -has been taking flomax with concern for kidney stone. Will hold this  -cycle troponin -heparin gtt/BB/statin/ASA  -CT head with no acute finding -TED hose -no hx of seizures, no tongue biting, no confusion. Questionable incontinence vs. Sweat, low suspicion for seizure

## 2022-04-06 NOTE — ED Provider Notes (Signed)
[facility] Provider Note   CSN: [id number] Arrival date & time: 04/06/22  1427     History  Chief Complaint  Patient presents with   Loss of Consciousness    Austin Vincent is a 51 y.o. male.  Patient with a history of CAD, kidney stones, hypertension, presenting with episode of loss of consciousness and syncope.  States he was driving home and was feeling very hungry.  He has not eaten or drinking today.  While he was driving he began to feel dizzy, lightheaded, clammy, sweaty and pulled over to the side of the road. He had no symptoms knocking on his window asking if he was all right.  He states he was confused when he woke up and was incontinent of urine.  Did not bite his tongue.  Did not hit his head.  Never did have chest pain or shortness of breath.  No history of previous seizure or syncope.  He denies any injury from his episode.  Denies any damage to his car. Patient is being scheduled for catheterization next week due to ongoing issues with possible angina and throat tightness.  He denies any that feeling today.  He also believes he has been passing kidney stones for the past several days with having pain in his penis, cramping with urination and hematuria.  The history is provided by the patient and the EMS personnel.  Loss of Consciousness Associated symptoms: dizziness and nausea   Associated symptoms: no chest pain, no fever, no headaches, no shortness of breath and no vomiting       Home Medications Prior to Admission medications   Medication Sig Start Date End Date Taking? Authorizing Provider  amLODipine (NORVASC) 2.5 MG tablet Take 1 tablet (2.5 mg total) by mouth daily. 04/03/22   Lelon Perla, MD  aspirin 81 MG tablet Take 81 mg by mouth daily.     [provider]  atorvastatin (LIPITOR) 80 MG tablet Take 0.5 tablets (40 mg total) by mouth daily. Patient taking differently: Take 80 mg by mouth daily.  12/31/21   Haydee Salter, MD  ezetimibe (ZETIA) 10 MG tablet Take 1 tablet (10 mg total) by mouth daily. 07/24/20   Lelon Perla, MD  hydrocodone-ibuprofen (VICOPROFEN) 5-200 MG tablet Take 1 tablet by mouth every 8 (eight) hours as needed for pain. 04/05/22   Haydee Salter, MD  ibuprofen (ADVIL) 200 MG tablet Take 400 mg by mouth every 6 (six) hours as needed for moderate pain or headache.    [provider]  metoprolol succinate (TOPROL-XL) 25 MG 24 hr tablet Take 1 tablet (25 mg total) by mouth daily. Patient not taking: Reported on 04/04/2022 07/24/20   Lelon Perla, MD  nitroGLYCERIN (NITROSTAT) 0.4 MG SL tablet Dissolve 1 tablet under the tongue every 5 minutes as  needed for chest pain 05/16/15   Lelon Perla, MD  phenazopyridine (PYRIDIUM) 100 MG tablet Take 1 tablet (100 mg total) by mouth 3 (three) times daily with meals. Patient not taking: Reported on 04/04/2022 03/15/22   Haydee Salter, MD  Simethicone (GAS-X PO) Take 1 tablet by mouth daily as needed (gas).    [provider]  tamsulosin (FLOMAX) 0.4 MG CAPS capsule Take 1 capsule (0.4 mg total) by mouth daily. Patient not taking: Reported on 04/04/2022 01/04/22   Haydee Salter, MD      Allergies    Patient has no known allergies.  Review of Systems   Review of Systems  Constitutional:  Negative for activity change, appetite change and fever.  HENT:  Negative for congestion and rhinorrhea.   Respiratory:  Negative for cough, chest tightness and shortness of breath.   Cardiovascular:  Positive for syncope. Negative for chest pain.  Gastrointestinal:  Positive for nausea. Negative for abdominal pain and vomiting.  Genitourinary:  Positive for hematuria.  Musculoskeletal:  Negative for arthralgias and myalgias.  Neurological:  Positive for dizziness, syncope and light-headedness. Negative for headaches.   all other systems are negative except as noted in the HPI and PMH.   Physical Exam Updated  Vital Signs BP (!) 169/88   Pulse 93   Temp (!) 97.5 F (36.4 C) (Oral)   Resp 15   Ht '5\' 8"'$  (1.727 m)   Wt 73.5 kg   SpO2 98%   BMI 24.63 kg/m  Physical Exam Vitals and nursing note reviewed.  Constitutional:      General: He is not in acute distress.    Appearance: He is well-developed.  HENT:     Head: Normocephalic and atraumatic.     Mouth/Throat:     Pharynx: No oropharyngeal exudate.  Eyes:     Conjunctiva/sclera: Conjunctivae normal.     Pupils: Pupils are equal, round, and reactive to light.  Neck:     Comments: No meningismus. Cardiovascular:     Rate and Rhythm: Normal rate and regular rhythm.     Heart sounds: Normal heart sounds. No murmur heard. Pulmonary:     Effort: Pulmonary effort is normal. No respiratory distress.     Breath sounds: Normal breath sounds.  Abdominal:     Palpations: Abdomen is soft.     Tenderness: There is no abdominal tenderness. There is no guarding or rebound.  Musculoskeletal:        General: No tenderness. Normal range of motion.     Cervical back: Normal range of motion and neck supple.  Skin:    General: Skin is warm.  Neurological:     Mental Status: He is alert and oriented to person, place, and time.     Cranial Nerves: No cranial nerve deficit.     Motor: No abnormal muscle tone.     Coordination: Coordination normal.     Comments:  5/5 strength throughout. CN 2-12 intact.Equal grip strength.   Psychiatric:        Behavior: Behavior normal.    ED Results / Procedures / Treatments   Labs (all labs ordered are listed, but only abnormal results are displayed) Labs Reviewed  CBC WITH DIFFERENTIAL/PLATELET - Abnormal; Notable for the following components:      Result Value   WBC 12.7 (*)    RBC 4.19 (*)    HCT 36.1 (*)    Neutro Abs 10.9 (*)    All other components within normal limits  COMPREHENSIVE METABOLIC PANEL - Abnormal; Notable for the following components:   Glucose, Bld 119 (*)    Total Protein 6.4 (*)     All other components within normal limits  URINALYSIS, ROUTINE W REFLEX MICROSCOPIC - Abnormal; Notable for the following components:   Hgb urine dipstick SMALL (*)    All other components within normal limits  TROPONIN I (HIGH SENSITIVITY) - Abnormal; Notable for the following components:   Troponin I (High Sensitivity) 25 (*)    All other components within normal limits  CK  D-DIMER, QUANTITATIVE  LIPASE, BLOOD  CBG MONITORING, ED  EKG EKG Interpretation  Date/Time:  Saturday Apr 06 2022 14:34:43 EDT Ventricular Rate:  85 PR Interval:  148 QRS Duration: 95 QT Interval:  337 QTC Calculation: 401 R Axis:   26 Text Interpretation: Sinus rhythm Probable left atrial enlargement Inferior infarct, age indeterminate ST elevation, consider anterior injury Lateral leads are also involved Confirmed by Wynona Dove ([id number]) on 04/06/2022 2:37:39 PM  Radiology CT Head Wo Contrast  Result Date: 04/06/2022 CLINICAL DATA:  Syncope/presyncope, cerebrovascular cause suspected EXAM: CT HEAD WITHOUT CONTRAST TECHNIQUE: Contiguous axial images were obtained from the base of the skull through the vertex without intravenous contrast. RADIATION DOSE REDUCTION: This exam was performed according to the departmental dose-optimization program which includes automated exposure control, adjustment of the mA and/or kV according to patient size and/or use of iterative reconstruction technique. COMPARISON:  None Available. FINDINGS: Brain: No evidence of acute infarction, hemorrhage, hydrocephalus, extra-axial collection or mass lesion/mass effect. Vascular: No hyperdense vessel identified. Calcific intracranial atherosclerosis Skull: No acute fracture. Sinuses/Orbits: Clear sinuses.  No acute orbital findings. Other: No mastoid effusions. IMPRESSION: No evidence of acute intracranial abnormality. Electronically Signed   By: Margaretha Sheffield M.D.   On: 04/06/2022 16:48   CT Angio Chest/Abd/Pel for Dissection W  and/or Wo Contrast  Result Date: 04/06/2022 CLINICAL DATA:  Acute aortic syndrome suspected. EXAM: CT ANGIOGRAPHY CHEST, ABDOMEN AND PELVIS TECHNIQUE: Non-contrast CT of the chest was initially obtained. Multidetector CT imaging through the chest, abdomen and pelvis was performed using the standard protocol during bolus administration of intravenous contrast. Multiplanar reconstructed images and MIPs were obtained and reviewed to evaluate the vascular anatomy. RADIATION DOSE REDUCTION: This exam was performed according to the departmental dose-optimization program which includes automated exposure control, adjustment of the mA and/or kV according to patient size and/or use of iterative reconstruction technique. CONTRAST:  186m OMNIPAQUE IOHEXOL 350 MG/ML SOLN COMPARISON:  Lung cancer screening CT from 01/23/2022 and CT AP from 03/25/2022. FINDINGS: CTA CHEST FINDINGS Cardiovascular: Preferential opacification of the thoracic aorta. No evidence of thoracic aortic aneurysm or dissection. Mild aortic atherosclerosis. Coronary artery calcifications. Normal heart size. No pericardial effusion. Mediastinum/Nodes: 2.2 cm right lobe of thyroid gland nodule, image 11/7. This has been evaluated on previous imaging. (ref: J Am Coll Radiol. 2015 Feb;12(2): 143-50).The trachea appears patent and midline. Normal appearance of the esophagus. No enlarged supraclavicular, axillary, mediastinal or hilar lymph nodes. Lungs/Pleura: There is no pleural effusion. No atelectasis or pneumothorax. Within the right upper lobe there is a nodular area of consolidation measuring 1.7 x 1.1 cm. Adjacent subpleural nodular density measures 1.6 by 0.8 cm. There are additional, smaller nodules including multiple clustered tree-in-bud nodules identified. Musculoskeletal: No chest wall abnormality. No acute or significant osseous findings. Review of the MIP images confirms the above findings. CTA ABDOMEN AND PELVIS FINDINGS VASCULAR Aorta:  Extensive calcified and noncalcified atherosclerotic plaque noted. Normal caliber aorta without aneurysm, dissection, vasculitis or significant stenosis. Celiac: Patent without evidence of aneurysm, dissection, vasculitis or significant stenosis. SMA: The SME scratch set SMA appears patent. However, there is noncalcified plaque within the proximal SMA which results and focal luminal narrowing of approximately 50%, image 68/12. Calcified and noncalcified plaque Renals: Is noted at the origin of both renal arteries. The scratch set no significant stenosis of the left renal artery. Approximately 20% stenosis of the proximal right renal artery noted. IMA: Patent. Noncalcified plaque noted at the origin of the IMA with approximately 20% stenosis. Inflow: Patent without evidence of aneurysm, dissection, vasculitis or significant  stenosis. Veins: No obvious venous abnormality within the limitations of this arterial phase study. Review of the MIP images confirms the above findings. NON-VASCULAR Hepatobiliary: No focal liver abnormality is seen. No gallstones, gallbladder wall thickening, or biliary dilatation. Pancreas: Unremarkable. No pancreatic ductal dilatation or surrounding inflammatory changes. Spleen: Normal in size without focal abnormality. Adrenals/Urinary Tract: Normal adrenal glands. No nephrolithiasis, hydronephrosis or mass identified. Urinary bladder appears unremarkable. Stomach/Bowel: Stomach is within normal limits. Appendix appears normal. No evidence of bowel wall thickening, distention, or inflammatory changes. Lymphatic: No abdominal or pelvic adenopathy identified. Reproductive: Prostate is unremarkable. Other: No free fluid or fluid collections. Musculoskeletal: No acute or significant osseous findings. Review of the MIP images confirms the above findings. IMPRESSION: 1. No evidence for aortic aneurysm or dissection. 2. Coronary artery calcifications noted. 3. Clustered nodular consolidation within  the right upper lobe with surrounding ground-glass nodules identified. Nodules measure up to 12 mm. These are new compared with the lung cancer screening CT from 01/23/2022 and is favored to represent acute infectious process. According to consensus criteria consider a non-contrast Chest CT at 3 months, a PET/CT, or tissue sampling. These guidelines do not apply to immunocompromised patients and patients with cancer. Follow up in patients with significant comorbidities as clinically warranted. For lung cancer screening, adhere to Lung-RADS guidelines. Reference: Radiology. 2017; 284(1):228-43. 4. Extensive abdominopelvic aortic atherosclerotic disease with branch vessel involvement. Areas of noncritical stenosis are noted involving the superior mesenteric artery, inferior mesenteric artery, and right renal artery. 5. Coronary artery calcifications identified. Aortic Atherosclerosis (ICD10-I70.0). Electronically Signed   By: Kerby Moors M.D.   On: 04/06/2022 17:06    Procedures .Critical Care Performed by: Ezequiel Essex, MD Authorized by: Ezequiel Essex, MD   Critical care provider statement:    Critical care time (minutes):  35   Critical care time was exclusive of:  Separately billable procedures and treating other patients   Critical care was necessary to treat or prevent imminent or life-threatening deterioration of the following conditions: ACS, syncope.   Critical care was time spent personally by me on the following activities:  Development of treatment plan with patient or surrogate, discussions with consultants, evaluation of patient's response to treatment, examination of patient, ordering and review of laboratory studies, ordering and review of radiographic studies, ordering and performing treatments and interventions, pulse oximetry, re-evaluation of patient's condition and review of old charts   I assumed direction of critical care for this patient from another provider in my  specialty: no     Care discussed with: admitting provider      Medications Ordered in ED Medications  lactated ringers bolus 1,000 mL (has no administration in time range)  sodium chloride 0.9 % bolus 1,000 mL (1,000 mLs Intravenous New Bag/Given 04/06/22 1504)    ED Course/ Medical Decision Making/ A&P Clinical Course as of 04/06/22 1521  Sat Apr 06, 2022  1503 ECG reviewed, mild ST elevation in V2 and V3 change from prior, spoke with on-call cardiology Dr. Ali Lowe.  No emergent cardiology intervention merited at this time.  Recommend medical management and consult to cardiology if needed.  [SG]    Clinical Course User Index [SG] Jeanell Sparrow, DO                           Medical Decision Making Amount and/or Complexity of Data Reviewed Independent Historian: spouse Labs: ordered. Decision-making details documented in ED Course. Radiology: ordered and independent interpretation  performed. Decision-making details documented in ED Course. ECG/medicine tests: ordered and independent interpretation performed. Decision-making details documented in ED Course.  Risk Decision regarding hospitalization.   Episode of loss of consciousness she denies dizziness, lightheadedness, sweating. No chest pain or shortness of breath.  Not my neurological exam.  EKG on arrival shows J-point elevation in V2 and V3 with T wave inversion anterior laterally.  This was reviewed with on-call interventional cardiology Dr. Ali Lowe by Dr. Pearline Cables prior to my arrival on shift. Dr. Ali Lowe did not think this represented STEMI.  Consider syncope, consider seizure.  Patient did not eat anything today.  EKG has new changes as outlined above.  Labs are reassuring.  Troponin minimally elevated 25.  D-dimer is negative.  Low suspicion for pulmonary embolism  D/w Dr. Johney Frame of cardiology. She will evaluate. Agrees with medical admission  Patient remains symptom-free.  Troponin has increased from 25-1 86.   Continues to deny chest pain.  Will initiate IV heparin as CT head is stable and shows no hemorrhage.  Results reviewed and interpreted by me.  Patient high risk syncope given his cardiac history.  CTA shows no evidence of pulmonary embolism or aortic dissection.  Does show lung nodule favored to be infection which needs further follow-up with repeat imaging or biopsy. Will initiate Unasyn given possibility of aspiration during loss of consciousness episode.  No fever, cough or leukocytosis  Plan admission.  Discussed with Dr. Eliberto Ivory.  Continue IV heparin with monitoring overnight.  Cardiology to evaluate.        Final Clinical Impression(s) / ED Diagnoses Final diagnoses:  Syncope and collapse  Elevated troponin  Abnormal EKG    Rx / DC Orders ED Discharge Orders     None         Kavina Cantave, Annie Main, MD 04/06/22 2255

## 2022-04-06 NOTE — Assessment & Plan Note (Addendum)
Continue high intensity statin +zetia   Recent LDL <70

## 2022-04-06 NOTE — Assessment & Plan Note (Addendum)
Pending thyroidectomy with ENT Also will be following with endocrinology

## 2022-04-06 NOTE — Assessment & Plan Note (Signed)
History of nephrolithiasis in past requiring lithotripsy Has been having symptoms since January CT renal stone study in 01/2022 with no acute findings/stones  Had passage of bloody clot and feels better Discussed will hold off CT study, but if pain returns would re-image him Discussed need to f/u with urology

## 2022-04-06 NOTE — Assessment & Plan Note (Signed)
New finding since his lung cancer screening cT in 01/2022 -clustered nodular consolidation within the right upper lobe with surrounding ground glass nodules identified. Favored to represent an acute infectious process.  -he has no signs or symptoms of infection and speficially denies fever/chills, shortness of breath or cough. Has mildly elevated WBC, but likely reactive -started on unasyn for possible aspiration during syncopal episdoe, will continue -check PCT, follow fever curve and trend CBC. If infectious agent ruled out needs further work up in setting of   of medullary thyroid cancer with repeat imaging/PET scan or biopsy.

## 2022-04-06 NOTE — Assessment & Plan Note (Addendum)
Troponin 25>183 in setting of syncope, concern for NSTEMI Cardiology consulted Echo has been ordered edp has started heparin  Continue to cycle troponin  He is chest pain free  Plans for LHC on 5/30 that was already scheduled out patient. 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 Ejection fraction 45-55%. The patient had PCI of his right coronary artery and PDA.
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
## 2022-04-06 NOTE — Assessment & Plan Note (Addendum)
Continue lopressor and norvasc 

## 2022-04-07 ENCOUNTER — Observation Stay (HOSPITAL_COMMUNITY): Payer: BC Managed Care – PPO

## 2022-04-07 DIAGNOSIS — J811 Chronic pulmonary edema: Secondary | ICD-10-CM | POA: Diagnosis not present

## 2022-04-07 DIAGNOSIS — Z8042 Family history of malignant neoplasm of prostate: Secondary | ICD-10-CM | POA: Diagnosis not present

## 2022-04-07 DIAGNOSIS — J439 Emphysema, unspecified: Secondary | ICD-10-CM | POA: Diagnosis present

## 2022-04-07 DIAGNOSIS — K6389 Other specified diseases of intestine: Secondary | ICD-10-CM | POA: Diagnosis not present

## 2022-04-07 DIAGNOSIS — G4700 Insomnia, unspecified: Secondary | ICD-10-CM | POA: Diagnosis present

## 2022-04-07 DIAGNOSIS — R079 Chest pain, unspecified: Secondary | ICD-10-CM | POA: Diagnosis not present

## 2022-04-07 DIAGNOSIS — C73 Malignant neoplasm of thyroid gland: Secondary | ICD-10-CM

## 2022-04-07 DIAGNOSIS — R55 Syncope and collapse: Secondary | ICD-10-CM | POA: Diagnosis not present

## 2022-04-07 DIAGNOSIS — Z955 Presence of coronary angioplasty implant and graft: Secondary | ICD-10-CM | POA: Diagnosis not present

## 2022-04-07 DIAGNOSIS — R7303 Prediabetes: Secondary | ICD-10-CM | POA: Diagnosis present

## 2022-04-07 DIAGNOSIS — K3189 Other diseases of stomach and duodenum: Secondary | ICD-10-CM | POA: Diagnosis not present

## 2022-04-07 DIAGNOSIS — Z79899 Other long term (current) drug therapy: Secondary | ICD-10-CM | POA: Diagnosis not present

## 2022-04-07 DIAGNOSIS — E8770 Fluid overload, unspecified: Secondary | ICD-10-CM | POA: Diagnosis not present

## 2022-04-07 DIAGNOSIS — R9431 Abnormal electrocardiogram [ECG] [EKG]: Secondary | ICD-10-CM | POA: Diagnosis not present

## 2022-04-07 DIAGNOSIS — K551 Chronic vascular disorders of intestine: Secondary | ICD-10-CM | POA: Diagnosis not present

## 2022-04-07 DIAGNOSIS — R32 Unspecified urinary incontinence: Secondary | ICD-10-CM | POA: Diagnosis present

## 2022-04-07 DIAGNOSIS — I10 Essential (primary) hypertension: Secondary | ICD-10-CM | POA: Diagnosis not present

## 2022-04-07 DIAGNOSIS — K5730 Diverticulosis of large intestine without perforation or abscess without bleeding: Secondary | ICD-10-CM | POA: Diagnosis not present

## 2022-04-07 DIAGNOSIS — Z8249 Family history of ischemic heart disease and other diseases of the circulatory system: Secondary | ICD-10-CM | POA: Diagnosis not present

## 2022-04-07 DIAGNOSIS — J90 Pleural effusion, not elsewhere classified: Secondary | ICD-10-CM | POA: Diagnosis not present

## 2022-04-07 DIAGNOSIS — I517 Cardiomegaly: Secondary | ICD-10-CM | POA: Diagnosis not present

## 2022-04-07 DIAGNOSIS — E785 Hyperlipidemia, unspecified: Secondary | ICD-10-CM | POA: Diagnosis not present

## 2022-04-07 DIAGNOSIS — I214 Non-ST elevation (NSTEMI) myocardial infarction: Secondary | ICD-10-CM | POA: Diagnosis not present

## 2022-04-07 DIAGNOSIS — I252 Old myocardial infarction: Secondary | ICD-10-CM | POA: Diagnosis not present

## 2022-04-07 DIAGNOSIS — Z4682 Encounter for fitting and adjustment of non-vascular catheter: Secondary | ICD-10-CM | POA: Diagnosis not present

## 2022-04-07 DIAGNOSIS — Z87891 Personal history of nicotine dependence: Secondary | ICD-10-CM | POA: Diagnosis not present

## 2022-04-07 DIAGNOSIS — J939 Pneumothorax, unspecified: Secondary | ICD-10-CM | POA: Diagnosis not present

## 2022-04-07 DIAGNOSIS — Z7982 Long term (current) use of aspirin: Secondary | ICD-10-CM | POA: Diagnosis not present

## 2022-04-07 DIAGNOSIS — I249 Acute ischemic heart disease, unspecified: Secondary | ICD-10-CM | POA: Diagnosis not present

## 2022-04-07 DIAGNOSIS — I082 Rheumatic disorders of both aortic and tricuspid valves: Secondary | ICD-10-CM | POA: Diagnosis not present

## 2022-04-07 DIAGNOSIS — N200 Calculus of kidney: Secondary | ICD-10-CM | POA: Diagnosis present

## 2022-04-07 DIAGNOSIS — D6959 Other secondary thrombocytopenia: Secondary | ICD-10-CM | POA: Diagnosis not present

## 2022-04-07 DIAGNOSIS — Z833 Family history of diabetes mellitus: Secondary | ICD-10-CM | POA: Diagnosis not present

## 2022-04-07 DIAGNOSIS — I700 Atherosclerosis of aorta: Secondary | ICD-10-CM | POA: Diagnosis not present

## 2022-04-07 DIAGNOSIS — D62 Acute posthemorrhagic anemia: Secondary | ICD-10-CM | POA: Diagnosis not present

## 2022-04-07 DIAGNOSIS — R918 Other nonspecific abnormal finding of lung field: Secondary | ICD-10-CM | POA: Diagnosis not present

## 2022-04-07 DIAGNOSIS — J9811 Atelectasis: Secondary | ICD-10-CM | POA: Diagnosis not present

## 2022-04-07 DIAGNOSIS — I2510 Atherosclerotic heart disease of native coronary artery without angina pectoris: Secondary | ICD-10-CM | POA: Diagnosis not present

## 2022-04-07 DIAGNOSIS — Z01810 Encounter for preprocedural cardiovascular examination: Secondary | ICD-10-CM | POA: Diagnosis not present

## 2022-04-07 DIAGNOSIS — I7090 Unspecified atherosclerosis: Secondary | ICD-10-CM | POA: Diagnosis not present

## 2022-04-07 DIAGNOSIS — Z951 Presence of aortocoronary bypass graft: Secondary | ICD-10-CM | POA: Diagnosis not present

## 2022-04-07 DIAGNOSIS — K5900 Constipation, unspecified: Secondary | ICD-10-CM | POA: Diagnosis not present

## 2022-04-07 LAB — BASIC METABOLIC PANEL
Anion gap: 6 (ref 5–15)
BUN: 13 mg/dL (ref 6–20)
CO2: 25 mmol/L (ref 22–32)
Calcium: 8.7 mg/dL — ABNORMAL LOW (ref 8.9–10.3)
Chloride: 108 mmol/L (ref 98–111)
Creatinine, Ser: 0.84 mg/dL (ref 0.61–1.24)
GFR, Estimated: >60 mL/min (ref >60)
Glucose, Bld: 105 mg/dL — ABNORMAL HIGH (ref 70–99)
Potassium: 3.9 mmol/L (ref 3.5–5.1)
Sodium: 139 mmol/L (ref 135–145)

## 2022-04-07 LAB — ECHOCARDIOGRAM COMPLETE
AR max vel: 1.97 cm2
AV Area VTI: 1.88 cm2
AV Area mean vel: 1.87 cm2
AV Mean grad: 4 mmHg
AV Peak grad: 9 mmHg
Ao pk vel: 1.5 m/s
Area-P 1/2: 2.95 cm2
Calc EF: 55.7 %
Height: 68 in
MV VTI: 1.48 cm2
S' Lateral: 3.5 cm
Single Plane A2C EF: 58.5 %
Single Plane A4C EF: 53.5 %
Weight: 2643.19 oz

## 2022-04-07 LAB — CBC
HCT: 35.9 % — ABNORMAL LOW (ref 39.0–52.0)
Hemoglobin: 12.8 g/dL — ABNORMAL LOW (ref 13.0–17.0)
MCH: 30.9 pg (ref 26.0–34.0)
MCHC: 35.7 g/dL (ref 30.0–36.0)
MCV: 86.7 fL (ref 80.0–100.0)
Platelets: 169 10*3/uL (ref 150–400)
RBC: 4.14 MIL/uL — ABNORMAL LOW (ref 4.22–5.81)
RDW: 12 % (ref 11.5–15.5)
WBC: 7.2 10*3/uL (ref 4.0–10.5)
nRBC: 0 % (ref 0.0–0.2)

## 2022-04-07 LAB — GLUCOSE, CAPILLARY: Glucose-Capillary: 178 mg/dL — ABNORMAL HIGH (ref 70–99)

## 2022-04-07 LAB — TROPONIN I (HIGH SENSITIVITY)
Troponin I (High Sensitivity): 2302 ng/L (ref <18)
Troponin I (High Sensitivity): 1579 ng/L (ref <18)
Troponin I (High Sensitivity): 1148 ng/L (ref <18)
Troponin I (High Sensitivity): 924 ng/L (ref <18)

## 2022-04-07 LAB — HEPARIN LEVEL (UNFRACTIONATED)
Heparin Unfractionated: 0.2 IU/mL — ABNORMAL LOW (ref 0.30–0.70)
Heparin Unfractionated: 0.72 IU/mL — ABNORMAL HIGH (ref 0.30–0.70)
Heparin Unfractionated: 0.69 IU/mL (ref 0.30–0.70)

## 2022-04-07 LAB — PROCALCITONIN: Procalcitonin: 37.36 ng/mL

## 2022-04-07 MED ORDER — HEPARIN BOLUS VIA INFUSION
3000.0000 [IU] | Freq: Once | INTRAVENOUS | Status: AC
  Administered 2022-04-07: 3000 [IU] via INTRAVENOUS
  Filled 2022-04-07: qty 3000

## 2022-04-07 MED ORDER — MELATONIN 3 MG PO TABS
3.0000 mg | ORAL_TABLET | Freq: Once | ORAL | Status: AC
  Administered 2022-04-07: 3 mg via ORAL
  Filled 2022-04-07: qty 1

## 2022-04-07 NOTE — Progress Notes (Signed)
ANTICOAGULATION CONSULT NOTE   Pharmacy Consult for heparin Indication: chest pain/ACS  No Known Allergies  Patient Measurements: Height: '5\' 8"'$  (172.7 cm) Weight: 74.9 kg (165 lb 3.2 oz) IBW/kg (Calculated) : 68.4 Heparin Dosing Weight: 73.5 kg  Vital Signs: Temp: 98 F (36.7 C) (05/28 1536) Temp Source: Oral (05/28 1536) BP: 113/63 (05/28 1536) Pulse Rate: 49 (05/28 1536)  Labs: Recent Labs    04/06/22 1433 04/06/22 1633 04/07/22 0314 04/07/22 0949 04/07/22 1122 04/07/22 1522 04/07/22 1740  HGB 13.0  --  12.8*  --   --   --   --   HCT 36.1*  --  35.9*  --   --   --   --   PLT 152  --  169  --   --   --   --   HEPARINUNFRC  --   --  0.20*  --  0.72*  --  0.69  CREATININE 0.95  --  0.84  --   --   --   --   CKTOTAL 63  --   --   --   --   --   --   TROPONINIHS 25*   < > 2,302* 1,579*  --  1,148*  --    < > = values in this interval not displayed.     Estimated Creatinine Clearance: 98.4 mL/min (by C-G formula based on SCr of 0.84 mg/dL).   Medical History: Past Medical History:  Diagnosis Date   CAD (coronary artery disease)    a. s/p MI @ age 80 with PCI of the RCA;  b. CTO of the LAD;  c. 11/2013 NSTEMI/PCI: LM nl, LAD 100 L->L collats, LCX 50p, OM2 80p, OM1 60ost, RCA 100p (3.5x38 Promus DES), RPL 99/80mPTCA only, EF 40-50%;  c. 11/2013 Echo: EF 50-55%, Gr1 DD, mild MR.   HYPERLIPIDEMIA    HYPERTENSION    Kidney stone 11/2021   MITRAL REGURGITATION    Myocardial infarction ([facility]    at age [age]  Tobacco abuse     Assessment: Austin Vincent presenting with near syncope, hx CAD, he is not on anticoagulation PTA, CBC wnl. Heparin level is 0.72, just above goal. The bolus dose may be contributing to increased heparin level. Troponin peaked at 2302. Plans for cath Tuesday.  Heparin level now within goal range at 0.69 but at upper end so will make small rate decrease. No bleeding or IV issues noted.   Goal of Therapy:  Heparin level 0.3-0.7 units/ml Monitor  platelets by anticoagulation protocol: Yes   Plan:  Decrease heparin gtt to 1000 units/hr Daily heparin level/CBC F/u plans for heparin after cath  Thank you for allowing pharmacy to participate in this patient's care.  FErin HearingPharmD., BCPS Clinical Pharmacist 04/07/2022 6:45 PM
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
## 2022-04-07 NOTE — Progress Notes (Signed)
Progress Note    [name]   MKL:[id number]  DOB: 07-27-1971  DOA: 04/06/2022     0 PCP: Haydee Salter, MD  Initial CC: syncope  Hospital Course: [name] is a 52 yo male with newly diagnosed medullary thyroid carcinoma, CAD, HTN, HLD, tobacco use who presented after a syncopal episode in the car. He was already stopped when having the episode and noted associated diaphoresis as well.  He regained consciousness when a bystander knocked on his window as well.  He was brought to the ER via EMS for further work-up and evaluation. Of note, he also has been monitored for nephrolithiasis and may have also passed a stone approximately 2 days prior to admission as he had associated groin pain, bloody urine output and probable passage of a stone.  Urine has since cleared after this.  On work-up, EKG showed new T wave inversions, and troponins trended up.  He had no chest pain during work-up.  He was started on a heparin drip and cardiology was consulted.  CT angio chest/abdomen/pelvis was also obtained to rule out any vascular abnormalities; this was negative for aortic aneurysm or dissection.  Coronary artery calcifications were noted.  There was also clustered nodular consolidation involving right upper lobe with surrounding groundglass nodules measuring up to 12 mm. Due to some concern for possible aspiration he was also started on Unasyn.  Procalcitonin was also elevated.  Interval History:  Resting in bed this morning in no distress.  Denies any chest pain or shortness of breath.  No palpitations.  Understands tentative plan is for heart cath on Tuesday. Still endorses some sensation of spasms in his neck that occasionally makes it painful.  Assessment and Plan: * Syncope - Syncopal episode sounds to have been related to cardiac etiology with ensuing NSTEMI -Does not appear to have had seizure-like activity nor postictal on presentation.  Orthostatics not performed on  admission -Follow-up echo    NSTEMI with CAD s/p multiple DES - hx CAD with stenting at age 51 (72) - TWI on EKG noted on admission; uptrending trops; continue trending to peak - remains CP free; plan for now is cath on Tues unless becomes symptomatic or other clinical change per cardiology - Follow-up echo - Continue heparin drip   Pulmonary nodules - Clustered nodular consolidation noted in right upper lobe with surrounding groundglass opacities noted on CT on admission.  Nodules are new compared to CT performed 01/23/2022.  Nodules measure up to 12 mm per read -Given underlying new medullary thyroid carcinoma, this may need further evaluation but currently NSTEMI taking precedence  Nephrolithiasis - History of nephrolithiasis in past requiring lithotripsy -Patient appears to have been symptomatic the past 3 to 4 weeks and appears he may have passed a stone approximately 2 days prior to admission with associated pain, hematuria which has all now resolved -No further work-up for now unless becomes symptomatic again or develops hematuria  Medullary thyroid carcinoma ([place]) - Diagnosed on FNA on 03/07/2022 - Patient following with ENT and endocrinology.  Currently no date set for thyroidectomy; seen by Dr. Constance Holster with ENT on 03/21/22  Essential hypertension - Continue amlodipine and Toprol  Hyperlipidemia Continue high intensity statin +zetia   Recent LDL <70  Atherosclerotic vascular disease - per CT: "Extensive abdominopelvic aortic atherosclerotic disease with branch vessel involvement. Areas of noncritical stenosis are noted involving the superior mesenteric artery, inferior mesenteric artery, and right renal artery." - Continue high intensity statin, ASA  Old records reviewed in assessment of this patient  Antimicrobials:   DVT prophylaxis:    Heparin drip   Code Status:   Code Status: Full Code  Disposition Plan: Home Tuesday or Wednesday Status is:  Inpatient  Objective: Blood pressure (!) 102/59, pulse (!) 58, temperature 97.8 F (36.6 C), temperature source Oral, resp. rate 17, height '5\' 8"'$  (1.727 m), weight 74.9 kg, SpO2 96 %.  Examination:  Physical Exam Constitutional:      General: He is not in acute distress.    Appearance: Normal appearance.  HENT:     Head: Normocephalic and atraumatic.     Mouth/Throat:     Mouth: Mucous membranes are moist.  Eyes:     Extraocular Movements: Extraocular movements intact.  Cardiovascular:     Rate and Rhythm: Normal rate and regular rhythm.     Heart sounds: Normal heart sounds.  Pulmonary:     Effort: Pulmonary effort is normal. No respiratory distress.     Breath sounds: Normal breath sounds. No wheezing.  Abdominal:     General: Bowel sounds are normal. There is no distension.     Palpations: Abdomen is soft.     Tenderness: There is no abdominal tenderness.  Musculoskeletal:        General: Normal range of motion.     Cervical back: Normal range of motion and neck supple.  Skin:    General: Skin is warm and dry.  Neurological:     General: No focal deficit present.     Mental Status: He is alert.  Psychiatric:        Mood and Affect: Mood normal.        Behavior: Behavior normal.     Consultants:  Cardiology  Procedures:  Heart cath tentatively planned for 04/09/2022  Data Reviewed: Results for orders placed or performed during the hospital encounter of 04/06/22 (from the past 24 hour(s))  CBC with Differential     Status: Abnormal   Collection Time: 04/06/22  2:33 PM  Result Value Ref Range   WBC 12.7 (H) 4.0 - 10.5 K/uL   RBC 4.19 (L) 4.22 - 5.81 MIL/uL   Hemoglobin 13.0 13.0 - 17.0 g/dL   HCT 36.1 (L) 39.0 - 52.0 %   MCV 86.2 80.0 - 100.0 fL   MCH 31.0 26.0 - 34.0 pg   MCHC 36.0 30.0 - 36.0 g/dL   RDW 11.9 11.5 - 15.5 %   Platelets 152 150 - 400 K/uL   nRBC 0.0 0.0 - 0.2 %   Neutrophils Relative % 85 %   Neutro Abs 10.9 (H) 1.7 - 7.7 K/uL   Lymphocytes  Relative 8 %   Lymphs Abs 1.0 0.7 - 4.0 K/uL   Monocytes Relative 5 %   Monocytes Absolute 0.7 0.1 - 1.0 K/uL   Eosinophils Relative 1 %   Eosinophils Absolute 0.1 0.0 - 0.5 K/uL   Basophils Relative 0 %   Basophils Absolute 0.0 0.0 - 0.1 K/uL   Immature Granulocytes 1 %   Abs Immature Granulocytes 0.06 0.00 - 0.07 K/uL  Comprehensive metabolic panel     Status: Abnormal   Collection Time: 04/06/22  2:33 PM  Result Value Ref Range   Sodium 136 135 - 145 mmol/L   Potassium 3.8 3.5 - 5.1 mmol/L   Chloride 103 98 - 111 mmol/L   CO2 25 22 - 32 mmol/L   Glucose, Bld 119 (H) 70 - 99 mg/dL   BUN 12 6 -  20 mg/dL   Creatinine, Ser 0.95 0.61 - 1.24 mg/dL   Calcium 8.9 8.9 - 10.3 mg/dL   Total Protein 6.4 (L) 6.5 - 8.1 g/dL   Albumin 4.0 3.5 - 5.0 g/dL   AST 31 15 - 41 U/L   ALT 37 0 - 44 U/L   Alkaline Phosphatase 51 38 - 126 U/L   Total Bilirubin 0.9 0.3 - 1.2 mg/dL   GFR, Estimated >60 >60 mL/min   Anion gap 8 5 - 15  Urinalysis, Routine w reflex microscopic Urine, Clean Catch     Status: Abnormal   Collection Time: 04/06/22  2:33 PM  Result Value Ref Range   Color, Urine YELLOW YELLOW   APPearance CLEAR CLEAR   Specific Gravity, Urine 1.011 1.005 - 1.030   pH 5.0 5.0 - 8.0   Glucose, UA NEGATIVE NEGATIVE mg/dL   Hgb urine dipstick SMALL (A) NEGATIVE   Bilirubin Urine NEGATIVE NEGATIVE   Ketones, ur NEGATIVE NEGATIVE mg/dL   Protein, ur NEGATIVE NEGATIVE mg/dL   Nitrite NEGATIVE NEGATIVE   Leukocytes,Ua NEGATIVE NEGATIVE   RBC / HPF 0-5 0 - 5 RBC/hpf   WBC, UA 0-5 0 - 5 WBC/hpf   Bacteria, UA NONE SEEN NONE SEEN   Mucus PRESENT   Troponin I (High Sensitivity)     Status: Abnormal   Collection Time: 04/06/22  2:33 PM  Result Value Ref Range   Troponin I (High Sensitivity) 25 (H) <18 ng/L  CK     Status: None   Collection Time: 04/06/22  2:33 PM  Result Value Ref Range   Total CK 63 49 - 397 U/L  Lipase, blood     Status: None   Collection Time: 04/06/22  2:33 PM   Result Value Ref Range   Lipase 29 11 - 51 U/L  D-dimer, quantitative     Status: None   Collection Time: 04/06/22  2:49 PM  Result Value Ref Range   D-Dimer, Quant <0.27 0.00 - 0.50 ug/mL-FEU  Troponin I (High Sensitivity)     Status: Abnormal   Collection Time: 04/06/22  4:33 PM  Result Value Ref Range   Troponin I (High Sensitivity) 186 (HH) <18 ng/L  Troponin I (High Sensitivity)     Status: Abnormal   Collection Time: 04/06/22  6:17 PM  Result Value Ref Range   Troponin I (High Sensitivity) 659 (HH) <18 ng/L  Procalcitonin - Baseline     Status: None   Collection Time: 04/06/22  6:17 PM  Result Value Ref Range   Procalcitonin 38.10 ng/mL  HIV Antibody (routine testing w rflx)     Status: None   Collection Time: 04/06/22  8:55 PM  Result Value Ref Range   HIV Screen 4th Generation wRfx Non Reactive Non Reactive  Troponin I (High Sensitivity)     Status: Abnormal   Collection Time: 04/06/22  8:55 PM  Result Value Ref Range   Troponin I (High Sensitivity) 1,433 (HH) <18 ng/L  Procalcitonin     Status: None   Collection Time: 04/07/22  3:14 AM  Result Value Ref Range   Procalcitonin 37.36 ng/mL  Heparin level (unfractionated)     Status: Abnormal   Collection Time: 04/07/22  3:14 AM  Result Value Ref Range   Heparin Unfractionated 0.20 (L) 0.30 - 0.70 IU/mL  CBC     Status: Abnormal   Collection Time: 04/07/22  3:14 AM  Result Value Ref Range   WBC 7.2 4.0 - 10.5 K/uL  RBC 4.14 (L) 4.22 - 5.81 MIL/uL   Hemoglobin 12.8 (L) 13.0 - 17.0 g/dL   HCT 35.9 (L) 39.0 - 52.0 %   MCV 86.7 80.0 - 100.0 fL   MCH 30.9 26.0 - 34.0 pg   MCHC 35.7 30.0 - 36.0 g/dL   RDW 12.0 11.5 - 15.5 %   Platelets 169 150 - 400 K/uL   nRBC 0.0 0.0 - 0.2 %  Basic metabolic panel     Status: Abnormal   Collection Time: 04/07/22  3:14 AM  Result Value Ref Range   Sodium 139 135 - 145 mmol/L   Potassium 3.9 3.5 - 5.1 mmol/L   Chloride 108 98 - 111 mmol/L   CO2 25 22 - 32 mmol/L   Glucose, Bld  105 (H) 70 - 99 mg/dL   BUN 13 6 - 20 mg/dL   Creatinine, Ser 0.84 0.61 - 1.24 mg/dL   Calcium 8.7 (L) 8.9 - 10.3 mg/dL   GFR, Estimated >60 >60 mL/min   Anion gap 6 5 - 15  Troponin I (High Sensitivity)     Status: Abnormal   Collection Time: 04/07/22  3:14 AM  Result Value Ref Range   Troponin I (High Sensitivity) 2,302 (HH) <18 ng/L  Glucose, capillary     Status: Abnormal   Collection Time: 04/07/22  8:06 AM  Result Value Ref Range   Glucose-Capillary 178 (H) 70 - 99 mg/dL    I have Reviewed nursing notes, Vitals, and Lab results since pt's last encounter. Pertinent lab results : see above I have ordered test including BMP, CBC, Mg I have reviewed the last note from staff over past 24 hours I have discussed pt's care plan and test results with nursing staff, case manager   LOS: 0 days   Dwyane Dee, MD [facility] 04/07/2022, 10:54 AM

## 2022-04-07 NOTE — Progress Notes (Signed)
ANTICOAGULATION CONSULT NOTE - Follow Up Consult  Pharmacy Consult for heparin Indication: ACS  Labs: Recent Labs    04/06/22 1433 04/06/22 1633 04/06/22 1817 04/06/22 2055 04/07/22 0314  HGB 13.0  --   --   --  12.8*  HCT 36.1*  --   --   --  35.9*  PLT 152  --   --   --  169  HEPARINUNFRC  --   --   --   --  0.20*  CREATININE 0.95  --   --   --   --   CKTOTAL 63  --   --   --   --   TROPONINIHS 25* 186* 659* 1,433*  --     Assessment: 51yo male subtherapeutic on heparin with initial dosing for ACS; no infusion issues or signs of bleeding per RN.  Goal of Therapy:  Heparin level 0.3-0.7 units/ml   Plan:  Will give heparin bolus of 3000 units and increase heparin infusion by 2-3 units/kg/hr to 1100 units/hr and check level in 6 hours.    Wynona Neat, PharmD, BCPS  04/07/2022,4:43 AM

## 2022-04-07 NOTE — Progress Notes (Addendum)
ANTICOAGULATION CONSULT NOTE - Initial Consult  Pharmacy Consult for heparin Indication: chest pain/ACS  No Known Allergies  Patient Measurements: Height: '5\' 8"'$  (172.7 cm) Weight: 74.9 kg (165 lb 3.2 oz) IBW/kg (Calculated) : 68.4 Heparin Dosing Weight: 73.5 kg  Vital Signs: Temp: 97.5 F (36.4 C) (05/28 1147) Temp Source: Oral (05/28 1147) BP: 116/62 (05/28 1147) Pulse Rate: 50 (05/28 1147)  Labs: Recent Labs    04/06/22 1433 04/06/22 1633 04/06/22 2055 04/07/22 0314 04/07/22 0949 04/07/22 1122  HGB 13.0  --   --  12.8*  --   --   HCT 36.1*  --   --  35.9*  --   --   PLT 152  --   --  169  --   --   HEPARINUNFRC  --   --   --  0.20*  --  0.72*  CREATININE 0.95  --   --  0.84  --   --   CKTOTAL 63  --   --   --   --   --   TROPONINIHS 25*   < > 1,433* 2,302* 1,579*  --    < > = values in this interval not displayed.     Estimated Creatinine Clearance: 98.4 mL/min (by C-G formula based on SCr of 0.84 mg/dL).   Medical History: Past Medical History:  Diagnosis Date   CAD (coronary artery disease)    a. s/p MI @ age 76 with PCI of the RCA;  b. CTO of the LAD;  c. 11/2013 NSTEMI/PCI: LM nl, LAD 100 L->L collats, LCX 50p, OM2 80p, OM1 60ost, RCA 100p (3.5x38 Promus DES), RPL 99/15mPTCA only, EF 40-50%;  c. 11/2013 Echo: EF 50-55%, Gr1 DD, mild MR.   HYPERLIPIDEMIA    HYPERTENSION    Kidney stone 11/2021   MITRAL REGURGITATION    Myocardial infarction ([facility]    at age [age]  Tobacco abuse     Assessment: [id number] presenting with near syncope, hx CAD, he is not on anticoagulation PTA, CBC wnl. Heparin level is 0.72, just above goal. The bolus dose may be contributing to increased heparin level. Troponin peaked at 2302. Plans for cath Tuesday.  Goal of Therapy:  Heparin level 0.3-0.7 units/ml Monitor platelets by anticoagulation protocol: Yes   Plan:  Decrease heparin gtt to 1050 units/hr F/u 6 hour heparin level Daily heparin level/CBC F/u plans for heparin  after cath  Thank you for allowing pharmacy to participate in this patient's care.  NReatha Harps PharmD PGY1 Pharmacy Resident 04/07/2022 12:33 PM Check [email or web address] for unit specific pharmacy number
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
## 2022-04-07 NOTE — Plan of Care (Signed)
  Problem: Education: Goal: Knowledge of General Education information will improve Description Including pain rating scale, medication(s)/side effects and non-pharmacologic comfort measures Outcome: Progressing   Problem: Health Behavior/Discharge Planning: Goal: Ability to manage health-related needs will improve Outcome: Progressing   
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
## 2022-04-07 NOTE — Progress Notes (Signed)
*  PRELIMINARY RESULTS* Echocardiogram 2D Echocardiogram has been performed.  Austin Vincent 04/07/2022, 9:10 AM

## 2022-04-08 ENCOUNTER — Inpatient Hospital Stay (HOSPITAL_COMMUNITY): Payer: BC Managed Care – PPO

## 2022-04-08 DIAGNOSIS — R55 Syncope and collapse: Secondary | ICD-10-CM | POA: Diagnosis not present

## 2022-04-08 DIAGNOSIS — I214 Non-ST elevation (NSTEMI) myocardial infarction: Secondary | ICD-10-CM | POA: Diagnosis not present

## 2022-04-08 DIAGNOSIS — R918 Other nonspecific abnormal finding of lung field: Secondary | ICD-10-CM | POA: Diagnosis not present

## 2022-04-08 LAB — URINALYSIS, ROUTINE W REFLEX MICROSCOPIC
Bacteria, UA: NONE SEEN
Bilirubin Urine: NEGATIVE
Glucose, UA: NEGATIVE mg/dL
Ketones, ur: NEGATIVE mg/dL
Leukocytes,Ua: NEGATIVE
Nitrite: NEGATIVE
Protein, ur: NEGATIVE mg/dL
Specific Gravity, Urine: 1.012 (ref 1.005–1.030)
pH: 7 (ref 5.0–8.0)

## 2022-04-08 LAB — CBC WITH DIFFERENTIAL/PLATELET
Abs Immature Granulocytes: 0.02 10*3/uL (ref 0.00–0.07)
Basophils Absolute: 0 10*3/uL (ref 0.0–0.1)
Basophils Relative: 0 %
Eosinophils Absolute: 0.5 10*3/uL (ref 0.0–0.5)
Eosinophils Relative: 8 %
HCT: 36.4 % — ABNORMAL LOW (ref 39.0–52.0)
Hemoglobin: 12.8 g/dL — ABNORMAL LOW (ref 13.0–17.0)
Immature Granulocytes: 0 %
Lymphocytes Relative: 33 %
Lymphs Abs: 2.2 10*3/uL (ref 0.7–4.0)
MCH: 30.5 pg (ref 26.0–34.0)
MCHC: 35.2 g/dL (ref 30.0–36.0)
MCV: 86.9 fL (ref 80.0–100.0)
Monocytes Absolute: 0.5 10*3/uL (ref 0.1–1.0)
Monocytes Relative: 8 %
Neutro Abs: 3.3 10*3/uL (ref 1.7–7.7)
Neutrophils Relative %: 51 %
Platelets: 162 10*3/uL (ref 150–400)
RBC: 4.19 MIL/uL — ABNORMAL LOW (ref 4.22–5.81)
RDW: 12.1 % (ref 11.5–15.5)
WBC: 6.6 10*3/uL (ref 4.0–10.5)
nRBC: 0 % (ref 0.0–0.2)

## 2022-04-08 LAB — BASIC METABOLIC PANEL
Anion gap: 5 (ref 5–15)
BUN: 11 mg/dL (ref 6–20)
CO2: 24 mmol/L (ref 22–32)
Calcium: 8.4 mg/dL — ABNORMAL LOW (ref 8.9–10.3)
Chloride: 108 mmol/L (ref 98–111)
Creatinine, Ser: 0.85 mg/dL (ref 0.61–1.24)
GFR, Estimated: >60 mL/min (ref >60)
Glucose, Bld: 113 mg/dL — ABNORMAL HIGH (ref 70–99)
Potassium: 4.1 mmol/L (ref 3.5–5.1)
Sodium: 137 mmol/L (ref 135–145)

## 2022-04-08 LAB — HEPARIN LEVEL (UNFRACTIONATED): Heparin Unfractionated: 0.53 IU/mL (ref 0.30–0.70)

## 2022-04-08 LAB — MAGNESIUM: Magnesium: 1.9 mg/dL (ref 1.7–2.4)

## 2022-04-08 LAB — PROCALCITONIN: Procalcitonin: 37.52 ng/mL

## 2022-04-08 MED ORDER — SODIUM CHLORIDE 0.9 % WEIGHT BASED INFUSION: 1.0000 mL/kg/h | INTRAVENOUS | Status: DC

## 2022-04-08 MED ORDER — MELATONIN 3 MG PO TABS
3.0000 mg | ORAL_TABLET | Freq: Once | ORAL | Status: AC
  Administered 2022-04-08: 3 mg via ORAL
  Filled 2022-04-08: qty 1

## 2022-04-08 MED ORDER — ASPIRIN 81 MG PO CHEW
81.0000 mg | CHEWABLE_TABLET | ORAL | Status: AC
  Administered 2022-04-09: 81 mg via ORAL
  Filled 2022-04-08: qty 1

## 2022-04-08 MED ORDER — SODIUM CHLORIDE 0.9 % WEIGHT BASED INFUSION
3.0000 mL/kg/h | INTRAVENOUS | Status: AC
  Administered 2022-04-09: 3 mL/kg/h via INTRAVENOUS

## 2022-04-08 MED ORDER — SODIUM CHLORIDE 0.9% FLUSH: 3.0000 mL | INTRAVENOUS | Status: DC | PRN

## 2022-04-08 MED ORDER — SODIUM CHLORIDE 0.9 % IV SOLN: 250.0000 mL | INTRAVENOUS | Status: DC | PRN

## 2022-04-08 NOTE — Progress Notes (Signed)
Progress Note  Patient Name: Austin Vincent Date of Encounter: 04/08/2022  [facility] Cardiologist: Kirk Ruths, MD   Subjective   No CP or dyspnea.  Inpatient Medications    Scheduled Meds:  amLODipine  2.5 mg Oral Daily   aspirin EC  81 mg Oral Daily   atorvastatin  80 mg Oral Daily   ezetimibe  10 mg Oral Daily   metoprolol succinate  25 mg Oral Daily   sodium chloride flush  3 mL Intravenous Q12H   sodium chloride flush  3 mL Intravenous Q12H   Continuous Infusions:  sodium chloride     ampicillin-sulbactam (UNASYN) IV 1.5 g (04/08/22 0527)   heparin 1,000 Units/hr (04/07/22 1928)   PRN Meds: sodium chloride, acetaminophen **OR** acetaminophen, nitroGLYCERIN, sodium chloride flush   Vital Signs    Vitals:   04/07/22 2000 04/08/22 0400 04/08/22 0500 04/08/22 0627  BP: 112/60 108/68  112/62  Pulse: (!) 52 (!) 47  (!) 51  Resp: '16 14  14  '$ Temp: 98.2 F (36.8 C) 98 F (36.7 C)  97.8 F (36.6 C)  TempSrc: Oral Oral  Oral  SpO2: 92% 92%  95%  Weight:   74.9 kg   Height:        Intake/Output Summary (Last 24 hours) at 04/08/2022 0755 Last data filed at 04/08/2022 0527 Gross per 24 hour  Intake 878.18 ml  Output 2100 ml  Net -1221.82 ml      04/08/2022    5:00 AM 04/07/2022    6:28 AM 04/07/2022   12:40 AM  Last 3 Weights  Weight (lbs) 165 lb 2 oz 165 lb 3.2 oz 165 lb 3.2 oz  Weight (kg) 74.9 kg 74.934 kg 74.934 kg      Telemetry    Sinus bradycardia- Personally Reviewed  Physical Exam   GEN: No acute distress.   Neck: No JVD Cardiac: RRR, no murmurs, rubs, or gallops.  Respiratory: Clear to auscultation bilaterally. GI: Soft, nontender, non-distended  MS: No edema Neuro:  Nonfocal  Psych: Normal affect   Labs    High Sensitivity Troponin:   Recent Labs  Lab 04/06/22 2055 04/07/22 0314 04/07/22 0949 04/07/22 1522 04/07/22 2050  TROPONINIHS 1,433* 2,302* 1,579* 1,148* 924*     Chemistry Recent Labs  Lab 04/06/22 1433  04/07/22 0314 04/08/22 0139  NA 136 139 137  K 3.8 3.9 4.1  CL 103 108 108  CO2 '25 25 24  '$ GLUCOSE 119* 105* 113*  BUN '12 13 11  '$ CREATININE 0.95 0.84 0.85  CALCIUM 8.9 8.7* 8.4*  MG  --   --  1.9  PROT 6.4*  --   --   ALBUMIN 4.0  --   --   AST 31  --   --   ALT 37  --   --   ALKPHOS 51  --   --   BILITOT 0.9  --   --   GFRNONAA >60 >60 >60  ANIONGAP '8 6 5     '$ Hematology Recent Labs  Lab 04/06/22 1433 04/07/22 0314 04/08/22 0139  WBC 12.7* 7.2 6.6  RBC 4.19* 4.14* 4.19*  HGB 13.0 12.8* 12.8*  HCT 36.1* 35.9* 36.4*  MCV 86.2 86.7 86.9  MCH 31.0 30.9 30.5  MCHC 36.0 35.7 35.2  RDW 11.9 12.0 12.1  PLT 152 169 162    DDimer  Recent Labs  Lab 04/06/22 1449  DDIMER <0.27     Radiology    CT Head Wo Contrast  Result Date: 04/06/2022 CLINICAL DATA:  Syncope/presyncope, cerebrovascular cause suspected EXAM: CT HEAD WITHOUT CONTRAST TECHNIQUE: Contiguous axial images were obtained from the base of the skull through the vertex without intravenous contrast. RADIATION DOSE REDUCTION: This exam was performed according to the departmental dose-optimization program which includes automated exposure control, adjustment of the mA and/or kV according to patient size and/or use of iterative reconstruction technique. COMPARISON:  None Available. FINDINGS: Brain: No evidence of acute infarction, hemorrhage, hydrocephalus, extra-axial collection or mass lesion/mass effect. Vascular: No hyperdense vessel identified. Calcific intracranial atherosclerosis Skull: No acute fracture. Sinuses/Orbits: Clear sinuses.  No acute orbital findings. Other: No mastoid effusions. IMPRESSION: No evidence of acute intracranial abnormality. Electronically Signed   By: Margaretha Sheffield M.D.   On: 04/06/2022 16:48   ECHOCARDIOGRAM COMPLETE  Result Date: 04/07/2022    ECHOCARDIOGRAM REPORT   Patient Name:   Austin Vincent [facility] Date of Exam: 04/07/2022 Medical Rec #:  [id number]           Height:       68.0 in  Accession #:    [id number]          Weight:       165.2 lb Date of Birth:  May 23, 1971            BSA:          1.884 m Patient Age:    51 years            BP:           102/59 mmHg Patient Gender: M                   HR:           58 bpm. Exam Location:  Inpatient Procedure: 2D Echo, Cardiac Doppler and Color Doppler Indications:    Syncope  History:        Patient has prior history of Echocardiogram examinations, most                 recent 12/01/2013. CAD and Previous Myocardial Infarction,                 Signs/Symptoms:Syncope; Risk Factors:Dyslipidemia, Hypertension                 and Current Smoker.  Sonographer:    Wenda Low Referring Phys: [id number] [place]  1. Can consider f/u contrast imaging to look at apex if clinically indicated.  2. Distal septal / apical hypokinesis Apex with calcified band/chords doubt thrombus. Left ventricular ejection fraction, by estimation, is 50 to 55%. The left ventricle has low normal function. The left ventricle demonstrates regional wall motion abnormalities (see scoring diagram/findings for description). Left ventricular diastolic parameters were normal.  3. Right ventricular systolic function is normal. The right ventricular size is normal.  4. Left atrial size was mildly dilated.  5. The mitral valve is abnormal. Trivial mitral valve regurgitation. No evidence of mitral stenosis.  6. The aortic valve is tricuspid. There is mild calcification of the aortic valve. Aortic valve regurgitation is not visualized. No aortic stenosis is present.  7. The inferior vena cava is normal in size with greater than 50% respiratory variability, suggesting right atrial pressure of 3 mmHg. FINDINGS  Left Ventricle: Distal septal / apical hypokinesis Apex with calcified band/chords doubt thrombus. Left ventricular ejection fraction, by estimation, is 50 to 55%. The left ventricle has low normal function. The left ventricle demonstrates regional wall  motion  abnormalities. The left ventricular internal cavity size was normal in size. There is no left ventricular hypertrophy. Left ventricular diastolic parameters were normal. Right Ventricle: The right ventricular size is normal. No increase in right ventricular wall thickness. Right ventricular systolic function is normal. Left Atrium: Left atrial size was mildly dilated. Right Atrium: Right atrial size was normal in size. Pericardium: There is no evidence of pericardial effusion. Mitral Valve: The mitral valve is abnormal. There is mild thickening of the mitral valve leaflet(s). Trivial mitral valve regurgitation. No evidence of mitral valve stenosis. MV peak gradient, 5.4 mmHg. The mean mitral valve gradient is 2.0 mmHg. Tricuspid Valve: The tricuspid valve is normal in structure. Tricuspid valve regurgitation is trivial. No evidence of tricuspid stenosis. Aortic Valve: The aortic valve is tricuspid. There is mild calcification of the aortic valve. Aortic valve regurgitation is not visualized. No aortic stenosis is present. Aortic valve mean gradient measures 4.0 mmHg. Aortic valve peak gradient measures 9.0 mmHg. Aortic valve area, by VTI measures 1.88 cm. Pulmonic Valve: The pulmonic valve was normal in structure. Pulmonic valve regurgitation is trivial. No evidence of pulmonic stenosis. Aorta: The aortic root is normal in size and structure. Venous: The inferior vena cava is normal in size with greater than 50% respiratory variability, suggesting right atrial pressure of 3 mmHg. IAS/Shunts: No atrial level shunt detected by color flow Doppler. Additional Comments: Can consider f/u contrast imaging to look at apex if clinically indicated.  LEFT VENTRICLE PLAX 2D LVIDd:         4.90 cm     Diastology LVIDs:         3.50 cm     LV e' medial:    7.51 cm/s LV PW:         1.00 cm     LV E/e' medial:  14.1 LV IVS:        1.10 cm     LV e' lateral:   12.90 cm/s LVOT diam:     1.90 cm     LV E/e' lateral: 8.2 LV SV:          66 LV SV Index:   35 LVOT Area:     2.84 cm  LV Volumes (MOD) LV vol d, MOD A2C: 76.2 ml LV vol d, MOD A4C: 74.8 ml LV vol s, MOD A2C: 31.6 ml LV vol s, MOD A4C: 34.8 ml LV SV MOD A2C:     44.6 ml LV SV MOD A4C:     74.8 ml LV SV MOD BP:      43.0 ml RIGHT VENTRICLE RV Basal diam:  3.25 cm RV Mid diam:    2.80 cm RV S prime:     12.80 cm/s TAPSE (M-mode): 2.4 cm LEFT ATRIUM             Index        RIGHT ATRIUM           Index LA diam:        4.10 cm 2.18 cm/m   RA Area:     17.30 cm LA Vol (A2C):   50.5 ml 26.80 ml/m  RA Volume:   43.10 ml  22.87 ml/m LA Vol (A4C):   59.3 ml 31.47 ml/m LA Biplane Vol: 57.8 ml 30.68 ml/m  AORTIC VALVE                    PULMONIC VALVE AV Area (Vmax):    1.97 cm  PV Vmax:       0.90 m/s AV Area (Vmean):   1.87 cm     PV Peak grad:  3.2 mmHg AV Area (VTI):     1.88 cm AV Vmax:           150.00 cm/s AV Vmean:          95.700 cm/s AV VTI:            0.352 m AV Peak Grad:      9.0 mmHg AV Mean Grad:      4.0 mmHg LVOT Vmax:         104.00 cm/s LVOT Vmean:        63.100 cm/s LVOT VTI:          0.234 m LVOT/AV VTI ratio: 0.66  AORTA Ao Root diam: 3.50 cm Ao Asc diam:  3.00 cm MITRAL VALVE MV Area (PHT): 2.95 cm     SHUNTS MV Area VTI:   1.48 cm     Systemic VTI:  0.23 m MV Peak grad:  5.4 mmHg     Systemic Diam: 1.90 cm MV Mean grad:  2.0 mmHg MV Vmax:       1.16 m/s MV Vmean:      58.4 cm/s MV Decel Time: 257 msec MV E velocity: 106.00 cm/s MV A velocity: 82.70 cm/s MV E/A ratio:  1.28 Jenkins Rouge MD Electronically signed by Jenkins Rouge MD Signature Date/Time: 04/07/2022/10:52:24 AM    Final    CT Angio Chest/Abd/Pel for Dissection W and/or Wo Contrast  Result Date: 04/06/2022 CLINICAL DATA:  Acute aortic syndrome suspected. EXAM: CT ANGIOGRAPHY CHEST, ABDOMEN AND PELVIS TECHNIQUE: Non-contrast CT of the chest was initially obtained. Multidetector CT imaging through the chest, abdomen and pelvis was performed using the standard protocol during bolus administration of  intravenous contrast. Multiplanar reconstructed images and MIPs were obtained and reviewed to evaluate the vascular anatomy. RADIATION DOSE REDUCTION: This exam was performed according to the departmental dose-optimization program which includes automated exposure control, adjustment of the mA and/or kV according to patient size and/or use of iterative reconstruction technique. CONTRAST:  113m OMNIPAQUE IOHEXOL 350 MG/ML SOLN COMPARISON:  Lung cancer screening CT from 01/23/2022 and CT AP from 03/25/2022. FINDINGS: CTA CHEST FINDINGS Cardiovascular: Preferential opacification of the thoracic aorta. No evidence of thoracic aortic aneurysm or dissection. Mild aortic atherosclerosis. Coronary artery calcifications. Normal heart size. No pericardial effusion. Mediastinum/Nodes: 2.2 cm right lobe of thyroid gland nodule, image 11/7. This has been evaluated on previous imaging. (ref: J Am Coll Radiol. 2015 Feb;12(2): 143-50).The trachea appears patent and midline. Normal appearance of the esophagus. No enlarged supraclavicular, axillary, mediastinal or hilar lymph nodes. Lungs/Pleura: There is no pleural effusion. No atelectasis or pneumothorax. Within the right upper lobe there is a nodular area of consolidation measuring 1.7 x 1.1 cm. Adjacent subpleural nodular density measures 1.6 by 0.8 cm. There are additional, smaller nodules including multiple clustered tree-in-bud nodules identified. Musculoskeletal: No chest wall abnormality. No acute or significant osseous findings. Review of the MIP images confirms the above findings. CTA ABDOMEN AND PELVIS FINDINGS VASCULAR Aorta: Extensive calcified and noncalcified atherosclerotic plaque noted. Normal caliber aorta without aneurysm, dissection, vasculitis or significant stenosis. Celiac: Patent without evidence of aneurysm, dissection, vasculitis or significant stenosis. SMA: The SME scratch set SMA appears patent. However, there is noncalcified plaque within the proximal  SMA which results and focal luminal narrowing of approximately 50%, image 68/12. Calcified and noncalcified plaque Renals: Is noted at  the origin of both renal arteries. The scratch set no significant stenosis of the left renal artery. Approximately 20% stenosis of the proximal right renal artery noted. IMA: Patent. Noncalcified plaque noted at the origin of the IMA with approximately 20% stenosis. Inflow: Patent without evidence of aneurysm, dissection, vasculitis or significant stenosis. Veins: No obvious venous abnormality within the limitations of this arterial phase study. Review of the MIP images confirms the above findings. NON-VASCULAR Hepatobiliary: No focal liver abnormality is seen. No gallstones, gallbladder wall thickening, or biliary dilatation. Pancreas: Unremarkable. No pancreatic ductal dilatation or surrounding inflammatory changes. Spleen: Normal in size without focal abnormality. Adrenals/Urinary Tract: Normal adrenal glands. No nephrolithiasis, hydronephrosis or mass identified. Urinary bladder appears unremarkable. Stomach/Bowel: Stomach is within normal limits. Appendix appears normal. No evidence of bowel wall thickening, distention, or inflammatory changes. Lymphatic: No abdominal or pelvic adenopathy identified. Reproductive: Prostate is unremarkable. Other: No free fluid or fluid collections. Musculoskeletal: No acute or significant osseous findings. Review of the MIP images confirms the above findings. IMPRESSION: 1. No evidence for aortic aneurysm or dissection. 2. Coronary artery calcifications noted. 3. Clustered nodular consolidation within the right upper lobe with surrounding ground-glass nodules identified. Nodules measure up to 12 mm. These are new compared with the lung cancer screening CT from 01/23/2022 and is favored to represent acute infectious process. According to consensus criteria consider a non-contrast Chest CT at 3 months, a PET/CT, or tissue sampling. These  guidelines do not apply to immunocompromised patients and patients with cancer. Follow up in patients with significant comorbidities as clinically warranted. For lung cancer screening, adhere to Lung-RADS guidelines. Reference: Radiology. 2017; 284(1):228-43. 4. Extensive abdominopelvic aortic atherosclerotic disease with branch vessel involvement. Areas of noncritical stenosis are noted involving the superior mesenteric artery, inferior mesenteric artery, and right renal artery. 5. Coronary artery calcifications identified. Aortic Atherosclerosis (ICD10-I70.0). Electronically Signed   By: Kerby Moors M.D.   On: 04/06/2022 17:06     Patient Profile     51 y.o. male with history of CAD s/p PCI to RCA at age 15 with NSTEMI in 2015 found to have occluded LAD (chronic) and RCA s/p PCI to RCA since that time, HTN, HLD and medullary thyroid cancer who presented with episode of syncope found to have elevated troponin and TWI in inferolateral leads for which Cardiology was consulted.  Troponins elevated.  Echocardiogram shows ejection fraction 50 to 55%, distal septal/apical hypokinesis, mild left atrial enlargement.  Assessment & Plan    1 non-ST elevation myocardial infarction-patient admitted with syncope.  Troponins are abnormal.  Plan to continue aspirin, heparin, low-dose beta-blocker.  Plan is for cardiac catheterization.  The risk and benefits including myocardial infarction, CVA and death discussed and he agrees to proceed.  2 syncope--based on description I wonder if he had a vagal episode (diaphoresis, nausea preceding).  LV function overall normal with septal/apical hypokinesis.  However troponins are abnormal.  Plan is for catheterization as outlined above. Question related to RCA ischemia.  Continue telemetry.  Patient instructed he will not be able to drive for 6 months following recent event.  3 coronary artery disease-continue aspirin and statin.  4 medullary thyroid cancer-patient will  need surgical resection.  If he requires PCI/dual antiplatelet therapy this will need to be delayed.  5 hypertension-blood pressure controlled.  Continue present medications.  6 hyperlipidemia-continue statin.  7 abnormal chest CT-will need follow-up noncontrast chest CT in 3 months.  For questions or updates, please contact [place] Please consult [email or web address]  for contact info under        Signed, Kirk Ruths, MD  04/08/2022, 7:55 AM

## 2022-04-08 NOTE — Progress Notes (Signed)
[place] for heparin Indication: chest pain/ACS  No Known Allergies  Patient Measurements: Height: '5\' 8"'$  (172.7 cm) Weight: 74.9 kg (165 lb 2 oz) IBW/kg (Calculated) : 68.4 Heparin Dosing Weight: 73.5 kg  Vital Signs: Temp: 97.8 F (36.6 C) (05/29 0627) Temp Source: Oral (05/29 0627) BP: 112/62 (05/29 0627) Pulse Rate: 51 (05/29 0627)  Labs: Recent Labs    04/06/22 1433 04/06/22 1633 04/07/22 0314 04/07/22 0949 04/07/22 1122 04/07/22 1522 04/07/22 1740 04/07/22 2050 04/08/22 0139  HGB 13.0  --  12.8*  --   --   --   --   --  12.8*  HCT 36.1*  --  35.9*  --   --   --   --   --  36.4*  PLT 152  --  169  --   --   --   --   --  162  HEPARINUNFRC  --    < > 0.20*  --  0.72*  --  0.69  --  0.53  CREATININE 0.95  --  0.84  --   --   --   --   --  0.85  CKTOTAL 63  --   --   --   --   --   --   --   --   TROPONINIHS 25*   < > 2,302* 1,579*  --  1,148*  --  924*  --    < > = values in this interval not displayed.     Estimated Creatinine Clearance: 97.2 mL/min (by C-G formula based on SCr of 0.85 mg/dL).   Medical History: Past Medical History:  Diagnosis Date   CAD (coronary artery disease)    a. s/p MI @ age 51 with PCI of the RCA;  b. CTO of the LAD;  c. 11/2013 NSTEMI/PCI: LM nl, LAD 100 L->L collats, LCX 50p, OM2 80p, OM1 60ost, RCA 100p (3.5x38 Promus DES), RPL 99/46mPTCA only, EF 40-50%;  c. 11/2013 Echo: EF 50-55%, Gr1 DD, mild MR.   HYPERLIPIDEMIA    HYPERTENSION    Kidney stone 11/2021   MITRAL REGURGITATION    Myocardial infarction ([facility]    at age [age]  Tobacco abuse     Assessment: 89YOM presenting with near syncope 51YOM, hx CAD, he is not on anticoagulation PTA, CBC wnl. Heparin level is therapeutic at 0.53..Marland KitchenThe bolus dose may be contributing to increased heparin level. Troponin peaked at 2302. Plans for cath Tuesday.  Heparin level now within goal range at 0.69 but at upper end so will make small rate decrease.  No bleeding or IV issues noted.   Goal of Therapy:  Heparin level 0.3-0.7 units/ml Monitor platelets by anticoagulation protocol: Yes   Plan:  Continue heparin gtt at 1000 units/hr Daily heparin level/CBC F/u plans for heparin after cath  Thank you for allowing pharmacy to participate in this patient's care.  NReatha Harps PharmD PGY1 Pharmacy Resident 04/08/2022 7:17 AM Check [email or web address] for unit specific pharmacy number

## 2022-04-08 NOTE — Assessment & Plan Note (Signed)
-   Noted on CT angio chest.  Patient may have aspirated during syncopal event is 1 differential.  See pulmonary nodule discussion as well. - Given mild leukocytosis on admission and elevated procalcitonin, treating empirically for aspiration coverage at this time - Continue Unasyn and will complete empiric Augmentin course - Continue trending procalcitonin

## 2022-04-08 NOTE — Progress Notes (Addendum)
Progress Note    Austin Vincent   KGM:[id number]  DOB: 16-Mar-1971  DOA: 04/06/2022     1 PCP: Austin Salter, MD  Initial CC: syncope  Hospital Course: Austin Vincent is a 51 yo male with newly diagnosed medullary thyroid carcinoma, CAD, HTN, HLD, tobacco use who presented after a syncopal episode in the car. He was already stopped when having the episode and noted associated diaphoresis as well.  He regained consciousness when a bystander knocked on his window as well.  He was brought to the ER via EMS for further work-up and evaluation. Of note, he also has been monitored for nephrolithiasis and may have also passed a stone approximately 2 days prior to admission as he had associated groin pain, bloody urine output and probable passage of a stone.  Urine has since cleared after this.  On work-up, EKG showed new T wave inversions, and troponins trended up.  He had no chest pain during work-up.  He was started on a heparin drip and cardiology was consulted.  CT angio chest/abdomen/pelvis was also obtained to rule out any vascular abnormalities; this was negative for aortic aneurysm or dissection.  Coronary artery calcifications were noted.  There was also clustered nodular consolidation involving right upper lobe with surrounding groundglass nodules measuring up to 12 mm. Due to some concern for possible aspiration he was also started on Unasyn.  Procalcitonin was also elevated.  Interval History:  No events overnight. Remains CP free. Denies cough or SOB  Assessment and Plan: * Syncope - Syncopal episode sounds to have been related to cardiac etiology with ensuing NSTEMI; of note, does have a history of bradycardia as well in the 50s looking thru prior office visits -Does not appear to have had seizure-like activity nor postictal on presentation.  Orthostatics not performed on admission but negative on 5/29 -echo shows normal EF 50-55%, normal diastology; distal septal/apical  hypokinesis apex with calcified band/chords; no AS/MS,no LVH -Patient instructed no driving for 6 months following event per cardiology recommendations  NSTEMI with CAD s/p multiple DES - hx CAD with stenting at age 57 (32) - TWI on EKG noted on admission; uptrending trops; continue trending to peak - remains CP free; plan for now is cath on Tues unless becomes symptomatic or other clinical change per cardiology - echo shows normal EF 50-55%, normal diastology; distal septal/apical hypokinesis apex with calcified band/chords; no AS/MS,no LVH - Continue heparin drip - given ongoing bradycardia and syncope on admission, going to hold Toprol for now; he has rates into the 40s at night on tele and prior office notes document HR in the 50s as well    Pulmonary nodules - Clustered nodular consolidation noted in right upper lobe with surrounding groundglass opacities noted on CT on admission.  Nodules are new compared to CT performed 01/23/2022.  Nodules measure up to 12 mm per read -Given underlying new medullary thyroid carcinoma, this may need further evaluation but currently NSTEMI taking precedence -Patient and wife are aware of findings and will plan for outpatient follow-up; discussed personally on 04/08/22  Ground glass opacity present on imaging of lung - Noted on CT angio chest.  Patient may have aspirated during syncopal event is 1 differential.  See pulmonary nodule discussion as well. - Given mild leukocytosis on admission and elevated procalcitonin, treating empirically for aspiration coverage at this time - Continue Unasyn and will complete empiric Augmentin course - Continue trending procalcitonin  Nephrolithiasis - History of nephrolithiasis in past requiring  lithotripsy -Patient appears to have been symptomatic the past 3 to 4 weeks and appears he may have passed a stone approximately 2 days prior to admission with associated pain, hematuria which has all now resolved -No further  work-up for now unless becomes symptomatic again or develops hematuria  Medullary thyroid carcinoma ([place]) - Diagnosed on FNA on 03/07/2022 - Patient following with ENT and endocrinology.  Currently no date set for thyroidectomy; seen by Austin Vincent with ENT on 03/21/22  Essential hypertension - Continue amlodipine and Toprol  Hyperlipidemia Continue high intensity statin +zetia   Recent LDL <70  Atherosclerotic vascular disease - per CT: "Extensive abdominopelvic aortic atherosclerotic disease with branch vessel involvement. Areas of noncritical stenosis are noted involving the superior mesenteric artery, inferior mesenteric artery, and right renal artery." - Continue high intensity statin, ASA    Old records reviewed in assessment of this patient  Antimicrobials:   DVT prophylaxis:    Heparin drip   Code Status:   Code Status: Full Code  Disposition Plan: Home Tuesday or Wednesday Status is: Inpatient  Objective: Blood pressure 112/62, pulse (!) 51, temperature 97.8 F (36.6 C), temperature source Oral, resp. rate 14, height '5\' 8"'$  (1.727 m), weight 74.9 kg, SpO2 95 %.  Examination:  Physical Exam Constitutional:      General: He is not in acute distress.    Appearance: Normal appearance.  HENT:     Head: Normocephalic and atraumatic.     Mouth/Throat:     Mouth: Mucous membranes are moist.  Eyes:     Extraocular Movements: Extraocular movements intact.  Cardiovascular:     Rate and Rhythm: Normal rate and regular rhythm.     Heart sounds: Normal heart sounds.  Pulmonary:     Effort: Pulmonary effort is normal. No respiratory distress.     Breath sounds: Normal breath sounds. No wheezing.  Abdominal:     General: Bowel sounds are normal. There is no distension.     Palpations: Abdomen is soft.     Tenderness: There is no abdominal tenderness.  Musculoskeletal:        General: Normal range of motion.     Cervical back: Normal range of motion and neck supple.   Skin:    General: Skin is warm and dry.  Neurological:     General: No focal deficit present.     Mental Status: He is alert.  Psychiatric:        Mood and Affect: Mood normal.        Behavior: Behavior normal.     Consultants:  Cardiology  Procedures:  Heart cath tentatively planned for 04/09/2022  Data Reviewed: Results for orders placed or performed during the hospital encounter of 04/06/22 (from the past 24 hour(s))  Troponin I (High Sensitivity)     Status: Abnormal   Collection Time: 04/07/22  3:22 PM  Result Value Ref Range   Troponin I (High Sensitivity) 1,148 (HH) <18 ng/L  Heparin level (unfractionated)     Status: None   Collection Time: 04/07/22  5:40 PM  Result Value Ref Range   Heparin Unfractionated 0.69 0.30 - 0.70 IU/mL  Troponin I (High Sensitivity)     Status: Abnormal   Collection Time: 04/07/22  8:50 PM  Result Value Ref Range   Troponin I (High Sensitivity) 924 (HH) <18 ng/L  Procalcitonin     Status: None   Collection Time: 04/08/22  1:39 AM  Result Value Ref Range   Procalcitonin 37.52 ng/mL  Heparin level (unfractionated)     Status: None   Collection Time: 04/08/22  1:39 AM  Result Value Ref Range   Heparin Unfractionated 0.53 0.30 - 0.70 IU/mL  Basic metabolic panel     Status: Abnormal   Collection Time: 04/08/22  1:39 AM  Result Value Ref Range   Sodium 137 135 - 145 mmol/L   Potassium 4.1 3.5 - 5.1 mmol/L   Chloride 108 98 - 111 mmol/L   CO2 24 22 - 32 mmol/L   Glucose, Bld 113 (H) 70 - 99 mg/dL   BUN 11 6 - 20 mg/dL   Creatinine, Ser 0.85 0.61 - 1.24 mg/dL   Calcium 8.4 (L) 8.9 - 10.3 mg/dL   GFR, Estimated >60 >60 mL/min   Anion gap 5 5 - 15  CBC with Differential/Platelet     Status: Abnormal   Collection Time: 04/08/22  1:39 AM  Result Value Ref Range   WBC 6.6 4.0 - 10.5 K/uL   RBC 4.19 (L) 4.22 - 5.81 MIL/uL   Hemoglobin 12.8 (L) 13.0 - 17.0 g/dL   HCT 36.4 (L) 39.0 - 52.0 %   MCV 86.9 80.0 - 100.0 fL   MCH 30.5 26.0 -  34.0 pg   MCHC 35.2 30.0 - 36.0 g/dL   RDW 12.1 11.5 - 15.5 %   Platelets 162 150 - 400 K/uL   nRBC 0.0 0.0 - 0.2 %   Neutrophils Relative % 51 %   Neutro Abs 3.3 1.7 - 7.7 K/uL   Lymphocytes Relative 33 %   Lymphs Abs 2.2 0.7 - 4.0 K/uL   Monocytes Relative 8 %   Monocytes Absolute 0.5 0.1 - 1.0 K/uL   Eosinophils Relative 8 %   Eosinophils Absolute 0.5 0.0 - 0.5 K/uL   Basophils Relative 0 %   Basophils Absolute 0.0 0.0 - 0.1 K/uL   Immature Granulocytes 0 %   Abs Immature Granulocytes 0.02 0.00 - 0.07 K/uL  Magnesium     Status: None   Collection Time: 04/08/22  1:39 AM  Result Value Ref Range   Magnesium 1.9 1.7 - 2.4 mg/dL    I have Reviewed nursing notes, Vitals, and Lab results since pt's last encounter. Pertinent lab results : see above I have ordered test including BMP, CBC, Mg I have reviewed the last note from staff over past 24 hours I have discussed pt's care plan and test results with nursing staff, case manager   LOS: 1 day   Dwyane Dee, MD [facility] 04/08/2022, 11:25 AM

## 2022-04-08 NOTE — Progress Notes (Signed)
  Orthostatic vitals:     04/08/22 0820  Orthostatic Lying   BP- Lying 128/66  Pulse- Lying (!) 49  Orthostatic Sitting  BP- Sitting 141/88  Pulse- Sitting 57  Orthostatic Standing at 0 minutes  BP- Standing at 0 minutes 125/76  Pulse- Standing at 0 minutes 55  Orthostatic Standing at 3 minutes  BP- Standing at 3 minutes 127/72  Pulse- Standing at 3 minutes 53

## 2022-04-08 NOTE — H&P (View-Only) (Signed)
Progress Note  Patient Name: Austin Vincent Fine Date of Encounter: 04/08/2022  [facility] Cardiologist: Kirk Ruths, MD   Subjective   No CP or dyspnea.  Inpatient Medications    Scheduled Meds:  amLODipine  2.5 mg Oral Daily   aspirin EC  81 mg Oral Daily   atorvastatin  80 mg Oral Daily   ezetimibe  10 mg Oral Daily   metoprolol succinate  25 mg Oral Daily   sodium chloride flush  3 mL Intravenous Q12H   sodium chloride flush  3 mL Intravenous Q12H   Continuous Infusions:  sodium chloride     ampicillin-sulbactam (UNASYN) IV 1.5 g (04/08/22 0527)   heparin 1,000 Units/hr (04/07/22 1928)   PRN Meds: sodium chloride, acetaminophen **OR** acetaminophen, nitroGLYCERIN, sodium chloride flush   Vital Signs    Vitals:   04/07/22 2000 04/08/22 0400 04/08/22 0500 04/08/22 0627  BP: 112/60 108/68  112/62  Pulse: (!) 52 (!) 47  (!) 51  Resp: '16 14  14  '$ Temp: 98.2 F (36.8 C) 98 F (36.7 C)  97.8 F (36.6 C)  TempSrc: Oral Oral  Oral  SpO2: 92% 92%  95%  Weight:   74.9 kg   Height:        Intake/Output Summary (Last 24 hours) at 04/08/2022 0755 Last data filed at 04/08/2022 0527 Gross per 24 hour  Intake 878.18 ml  Output 2100 ml  Net -1221.82 ml      04/08/2022    5:00 AM 04/07/2022    6:28 AM 04/07/2022   12:40 AM  Last 3 Weights  Weight (lbs) 165 lb 2 oz 165 lb 3.2 oz 165 lb 3.2 oz  Weight (kg) 74.9 kg 74.934 kg 74.934 kg      Telemetry    Sinus bradycardia- Personally Reviewed  Physical Exam   GEN: No acute distress.   Neck: No JVD Cardiac: RRR, no murmurs, rubs, or gallops.  Respiratory: Clear to auscultation bilaterally. GI: Soft, nontender, non-distended  MS: No edema Neuro:  Nonfocal  Psych: Normal affect   Labs    High Sensitivity Troponin:   Recent Labs  Lab 04/06/22 2055 04/07/22 0314 04/07/22 0949 04/07/22 1522 04/07/22 2050  TROPONINIHS 1,433* 2,302* 1,579* 1,148* 924*     Chemistry Recent Labs  Lab 04/06/22 1433  04/07/22 0314 04/08/22 0139  NA 136 139 137  K 3.8 3.9 4.1  CL 103 108 108  CO2 '25 25 24  '$ GLUCOSE 119* 105* 113*  BUN '12 13 11  '$ CREATININE 0.95 0.84 0.85  CALCIUM 8.9 8.7* 8.4*  MG  --   --  1.9  PROT 6.4*  --   --   ALBUMIN 4.0  --   --   AST 31  --   --   ALT 37  --   --   ALKPHOS 51  --   --   BILITOT 0.9  --   --   GFRNONAA >60 >60 >60  ANIONGAP '8 6 5     '$ Hematology Recent Labs  Lab 04/06/22 1433 04/07/22 0314 04/08/22 0139  WBC 12.7* 7.2 6.6  RBC 4.19* 4.14* 4.19*  HGB 13.0 12.8* 12.8*  HCT 36.1* 35.9* 36.4*  MCV 86.2 86.7 86.9  MCH 31.0 30.9 30.5  MCHC 36.0 35.7 35.2  RDW 11.9 12.0 12.1  PLT 152 169 162    DDimer  Recent Labs  Lab 04/06/22 1449  DDIMER <0.27     Radiology    CT Head Wo Contrast  Result Date: 04/06/2022 CLINICAL DATA:  Syncope/presyncope, cerebrovascular cause suspected EXAM: CT HEAD WITHOUT CONTRAST TECHNIQUE: Contiguous axial images were obtained from the base of the skull through the vertex without intravenous contrast. RADIATION DOSE REDUCTION: This exam was performed according to the departmental dose-optimization program which includes automated exposure control, adjustment of the mA and/or kV according to patient size and/or use of iterative reconstruction technique. COMPARISON:  None Available. FINDINGS: Brain: No evidence of acute infarction, hemorrhage, hydrocephalus, extra-axial collection or mass lesion/mass effect. Vascular: No hyperdense vessel identified. Calcific intracranial atherosclerosis Skull: No acute fracture. Sinuses/Orbits: Clear sinuses.  No acute orbital findings. Other: No mastoid effusions. IMPRESSION: No evidence of acute intracranial abnormality. Electronically Signed   By: Margaretha Sheffield M.D.   On: 04/06/2022 16:48   ECHOCARDIOGRAM COMPLETE  Result Date: 04/07/2022    ECHOCARDIOGRAM REPORT   Patient Name:   [name] [facility] Date of Exam: 04/07/2022 Medical Rec #:  [id number]           Height:       68.0 in  Accession #:    [id number]          Weight:       165.2 lb Date of Birth:  Feb 15, 1971            BSA:          1.884 m Patient Age:    51 years            BP:           102/59 mmHg Patient Gender: M                   HR:           58 bpm. Exam Location:  Inpatient Procedure: 2D Echo, Cardiac Doppler and Color Doppler Indications:    Syncope  History:        Patient has prior history of Echocardiogram examinations, most                 recent 12/01/2013. CAD and Previous Myocardial Infarction,                 Signs/Symptoms:Syncope; Risk Factors:Dyslipidemia, Hypertension                 and Current Smoker.  Sonographer:    Wenda Low Referring Phys: [id number] [place]  1. Can consider f/u contrast imaging to look at apex if clinically indicated.  2. Distal septal / apical hypokinesis Apex with calcified band/chords doubt thrombus. Left ventricular ejection fraction, by estimation, is 50 to 55%. The left ventricle has low normal function. The left ventricle demonstrates regional wall motion abnormalities (see scoring diagram/findings for description). Left ventricular diastolic parameters were normal.  3. Right ventricular systolic function is normal. The right ventricular size is normal.  4. Left atrial size was mildly dilated.  5. The mitral valve is abnormal. Trivial mitral valve regurgitation. No evidence of mitral stenosis.  6. The aortic valve is tricuspid. There is mild calcification of the aortic valve. Aortic valve regurgitation is not visualized. No aortic stenosis is present.  7. The inferior vena cava is normal in size with greater than 50% respiratory variability, suggesting right atrial pressure of 3 mmHg. FINDINGS  Left Ventricle: Distal septal / apical hypokinesis Apex with calcified band/chords doubt thrombus. Left ventricular ejection fraction, by estimation, is 50 to 55%. The left ventricle has low normal function. The left ventricle demonstrates regional wall  motion  abnormalities. The left ventricular internal cavity size was normal in size. There is no left ventricular hypertrophy. Left ventricular diastolic parameters were normal. Right Ventricle: The right ventricular size is normal. No increase in right ventricular wall thickness. Right ventricular systolic function is normal. Left Atrium: Left atrial size was mildly dilated. Right Atrium: Right atrial size was normal in size. Pericardium: There is no evidence of pericardial effusion. Mitral Valve: The mitral valve is abnormal. There is mild thickening of the mitral valve leaflet(s). Trivial mitral valve regurgitation. No evidence of mitral valve stenosis. MV peak gradient, 5.4 mmHg. The mean mitral valve gradient is 2.0 mmHg. Tricuspid Valve: The tricuspid valve is normal in structure. Tricuspid valve regurgitation is trivial. No evidence of tricuspid stenosis. Aortic Valve: The aortic valve is tricuspid. There is mild calcification of the aortic valve. Aortic valve regurgitation is not visualized. No aortic stenosis is present. Aortic valve mean gradient measures 4.0 mmHg. Aortic valve peak gradient measures 9.0 mmHg. Aortic valve area, by VTI measures 1.88 cm. Pulmonic Valve: The pulmonic valve was normal in structure. Pulmonic valve regurgitation is trivial. No evidence of pulmonic stenosis. Aorta: The aortic root is normal in size and structure. Venous: The inferior vena cava is normal in size with greater than 50% respiratory variability, suggesting right atrial pressure of 3 mmHg. IAS/Shunts: No atrial level shunt detected by color flow Doppler. Additional Comments: Can consider f/u contrast imaging to look at apex if clinically indicated.  LEFT VENTRICLE PLAX 2D LVIDd:         4.90 cm     Diastology LVIDs:         3.50 cm     LV e' medial:    7.51 cm/s LV PW:         1.00 cm     LV E/e' medial:  14.1 LV IVS:        1.10 cm     LV e' lateral:   12.90 cm/s LVOT diam:     1.90 cm     LV E/e' lateral: 8.2 LV SV:          66 LV SV Index:   35 LVOT Area:     2.84 cm  LV Volumes (MOD) LV vol d, MOD A2C: 76.2 ml LV vol d, MOD A4C: 74.8 ml LV vol s, MOD A2C: 31.6 ml LV vol s, MOD A4C: 34.8 ml LV SV MOD A2C:     44.6 ml LV SV MOD A4C:     74.8 ml LV SV MOD BP:      43.0 ml RIGHT VENTRICLE RV Basal diam:  3.25 cm RV Mid diam:    2.80 cm RV S prime:     12.80 cm/s TAPSE (M-mode): 2.4 cm LEFT ATRIUM             Index        RIGHT ATRIUM           Index LA diam:        4.10 cm 2.18 cm/m   RA Area:     17.30 cm LA Vol (A2C):   50.5 ml 26.80 ml/m  RA Volume:   43.10 ml  22.87 ml/m LA Vol (A4C):   59.3 ml 31.47 ml/m LA Biplane Vol: 57.8 ml 30.68 ml/m  AORTIC VALVE                    PULMONIC VALVE AV Area (Vmax):    1.97 cm  PV Vmax:       0.90 m/s AV Area (Vmean):   1.87 cm     PV Peak grad:  3.2 mmHg AV Area (VTI):     1.88 cm AV Vmax:           150.00 cm/s AV Vmean:          95.700 cm/s AV VTI:            0.352 m AV Peak Grad:      9.0 mmHg AV Mean Grad:      4.0 mmHg LVOT Vmax:         104.00 cm/s LVOT Vmean:        63.100 cm/s LVOT VTI:          0.234 m LVOT/AV VTI ratio: 0.66  AORTA Ao Root diam: 3.50 cm Ao Asc diam:  3.00 cm MITRAL VALVE MV Area (PHT): 2.95 cm     SHUNTS MV Area VTI:   1.48 cm     Systemic VTI:  0.23 m MV Peak grad:  5.4 mmHg     Systemic Diam: 1.90 cm MV Mean grad:  2.0 mmHg MV Vmax:       1.16 m/s MV Vmean:      58.4 cm/s MV Decel Time: 257 msec MV E velocity: 106.00 cm/s MV A velocity: 82.70 cm/s MV E/A ratio:  1.28 Jenkins Rouge MD Electronically signed by Jenkins Rouge MD Signature Date/Time: 04/07/2022/10:52:24 AM    Final    CT Angio Chest/Abd/Pel for Dissection W and/or Wo Contrast  Result Date: 04/06/2022 CLINICAL DATA:  Acute aortic syndrome suspected. EXAM: CT ANGIOGRAPHY CHEST, ABDOMEN AND PELVIS TECHNIQUE: Non-contrast CT of the chest was initially obtained. Multidetector CT imaging through the chest, abdomen and pelvis was performed using the standard protocol during bolus administration of  intravenous contrast. Multiplanar reconstructed images and MIPs were obtained and reviewed to evaluate the vascular anatomy. RADIATION DOSE REDUCTION: This exam was performed according to the departmental dose-optimization program which includes automated exposure control, adjustment of the mA and/or kV according to patient size and/or use of iterative reconstruction technique. CONTRAST:  162m OMNIPAQUE IOHEXOL 350 MG/ML SOLN COMPARISON:  Lung cancer screening CT from 01/23/2022 and CT AP from 03/25/2022. FINDINGS: CTA CHEST FINDINGS Cardiovascular: Preferential opacification of the thoracic aorta. No evidence of thoracic aortic aneurysm or dissection. Mild aortic atherosclerosis. Coronary artery calcifications. Normal heart size. No pericardial effusion. Mediastinum/Nodes: 2.2 cm right lobe of thyroid gland nodule, image 11/7. This has been evaluated on previous imaging. (ref: J Am Coll Radiol. 2015 Feb;12(2): 143-50).The trachea appears patent and midline. Normal appearance of the esophagus. No enlarged supraclavicular, axillary, mediastinal or hilar lymph nodes. Lungs/Pleura: There is no pleural effusion. No atelectasis or pneumothorax. Within the right upper lobe there is a nodular area of consolidation measuring 1.7 x 1.1 cm. Adjacent subpleural nodular density measures 1.6 by 0.8 cm. There are additional, smaller nodules including multiple clustered tree-in-bud nodules identified. Musculoskeletal: No chest wall abnormality. No acute or significant osseous findings. Review of the MIP images confirms the above findings. CTA ABDOMEN AND PELVIS FINDINGS VASCULAR Aorta: Extensive calcified and noncalcified atherosclerotic plaque noted. Normal caliber aorta without aneurysm, dissection, vasculitis or significant stenosis. Celiac: Patent without evidence of aneurysm, dissection, vasculitis or significant stenosis. SMA: The SME scratch set SMA appears patent. However, there is noncalcified plaque within the proximal  SMA which results and focal luminal narrowing of approximately 50%, image 68/12. Calcified and noncalcified plaque Renals: Is noted at  the origin of both renal arteries. The scratch set no significant stenosis of the left renal artery. Approximately 20% stenosis of the proximal right renal artery noted. IMA: Patent. Noncalcified plaque noted at the origin of the IMA with approximately 20% stenosis. Inflow: Patent without evidence of aneurysm, dissection, vasculitis or significant stenosis. Veins: No obvious venous abnormality within the limitations of this arterial phase study. Review of the MIP images confirms the above findings. NON-VASCULAR Hepatobiliary: No focal liver abnormality is seen. No gallstones, gallbladder wall thickening, or biliary dilatation. Pancreas: Unremarkable. No pancreatic ductal dilatation or surrounding inflammatory changes. Spleen: Normal in size without focal abnormality. Adrenals/Urinary Tract: Normal adrenal glands. No nephrolithiasis, hydronephrosis or mass identified. Urinary bladder appears unremarkable. Stomach/Bowel: Stomach is within normal limits. Appendix appears normal. No evidence of bowel wall thickening, distention, or inflammatory changes. Lymphatic: No abdominal or pelvic adenopathy identified. Reproductive: Prostate is unremarkable. Other: No free fluid or fluid collections. Musculoskeletal: No acute or significant osseous findings. Review of the MIP images confirms the above findings. IMPRESSION: 1. No evidence for aortic aneurysm or dissection. 2. Coronary artery calcifications noted. 3. Clustered nodular consolidation within the right upper lobe with surrounding ground-glass nodules identified. Nodules measure up to 12 mm. These are new compared with the lung cancer screening CT from 01/23/2022 and is favored to represent acute infectious process. According to consensus criteria consider a non-contrast Chest CT at 3 months, a PET/CT, or tissue sampling. These  guidelines do not apply to immunocompromised patients and patients with cancer. Follow up in patients with significant comorbidities as clinically warranted. For lung cancer screening, adhere to Lung-RADS guidelines. Reference: Radiology. 2017; 284(1):228-43. 4. Extensive abdominopelvic aortic atherosclerotic disease with branch vessel involvement. Areas of noncritical stenosis are noted involving the superior mesenteric artery, inferior mesenteric artery, and right renal artery. 5. Coronary artery calcifications identified. Aortic Atherosclerosis (ICD10-I70.0). Electronically Signed   By: Kerby Moors M.D.   On: 04/06/2022 17:06     Patient Profile     51 y.o. male with history of CAD s/p PCI to RCA at age 63 with NSTEMI in 2015 found to have occluded LAD (chronic) and RCA s/p PCI to RCA since that time, HTN, HLD and medullary thyroid cancer who presented with episode of syncope found to have elevated troponin and TWI in inferolateral leads for which Cardiology was consulted.  Troponins elevated.  Echocardiogram shows ejection fraction 50 to 55%, distal septal/apical hypokinesis, mild left atrial enlargement.  Assessment & Plan    1 non-ST elevation myocardial infarction-patient admitted with syncope.  Troponins are abnormal.  Plan to continue aspirin, heparin, low-dose beta-blocker.  Plan is for cardiac catheterization.  The risk and benefits including myocardial infarction, CVA and death discussed and he agrees to proceed.  2 syncope--based on description I wonder if he had a vagal episode (diaphoresis, nausea preceding).  LV function overall normal with septal/apical hypokinesis.  However troponins are abnormal.  Plan is for catheterization as outlined above. Question related to RCA ischemia.  Continue telemetry.  Patient instructed he will not be able to drive for 6 months following recent event.  3 coronary artery disease-continue aspirin and statin.  4 medullary thyroid cancer-patient will  need surgical resection.  If he requires PCI/dual antiplatelet therapy this will need to be delayed.  5 hypertension-blood pressure controlled.  Continue present medications.  6 hyperlipidemia-continue statin.  7 abnormal chest CT-will need follow-up noncontrast chest CT in 3 months.  For questions or updates, please contact [facility] Please consult [email or web address]  for contact info under        Signed, Kirk Ruths, MD  04/08/2022, 7:55 AM

## 2022-04-09 ENCOUNTER — Other Ambulatory Visit (HOSPITAL_COMMUNITY): Payer: BC Managed Care – PPO

## 2022-04-09 ENCOUNTER — Ambulatory Visit (HOSPITAL_COMMUNITY)
Admission: RE | Admit: 2022-04-09 | Payer: BC Managed Care – PPO | Source: Home / Self Care | Admitting: Interventional Cardiology

## 2022-04-09 ENCOUNTER — Inpatient Hospital Stay (HOSPITAL_COMMUNITY): Payer: BC Managed Care – PPO

## 2022-04-09 ENCOUNTER — Encounter (HOSPITAL_COMMUNITY): Payer: Self-pay | Admitting: Cardiology

## 2022-04-09 ENCOUNTER — Encounter (HOSPITAL_COMMUNITY)
Admission: EM | Disposition: A | Discharge status: Home / Self Care | Payer: Self-pay | Source: Home / Self Care | Attending: Cardiothoracic Surgery

## 2022-04-09 DIAGNOSIS — I214 Non-ST elevation (NSTEMI) myocardial infarction: Secondary | ICD-10-CM | POA: Diagnosis not present

## 2022-04-09 DIAGNOSIS — R55 Syncope and collapse: Secondary | ICD-10-CM | POA: Diagnosis not present

## 2022-04-09 DIAGNOSIS — I2510 Atherosclerotic heart disease of native coronary artery without angina pectoris: Secondary | ICD-10-CM

## 2022-04-09 HISTORY — PX: LEFT HEART CATH AND CORONARY ANGIOGRAPHY: CATH118249

## 2022-04-09 LAB — BASIC METABOLIC PANEL
Anion gap: 8 (ref 5–15)
BUN: 10 mg/dL (ref 6–20)
CO2: 22 mmol/L (ref 22–32)
Calcium: 8.9 mg/dL (ref 8.9–10.3)
Chloride: 105 mmol/L (ref 98–111)
Creatinine, Ser: 0.89 mg/dL (ref 0.61–1.24)
GFR, Estimated: >60 mL/min (ref >60)
Glucose, Bld: 99 mg/dL (ref 70–99)
Potassium: 4.2 mmol/L (ref 3.5–5.1)
Sodium: 135 mmol/L (ref 135–145)

## 2022-04-09 LAB — PULMONARY FUNCTION TEST
DL/VA % pred: 96 %
DL/VA: 4.27 ml/min/mmHg/L
DLCO cor % pred: 86 %
DLCO cor: 23.53 ml/min/mmHg
DLCO unc % pred: 84 %
DLCO unc: 22.91 ml/min/mmHg
FEF 25-75 Post: 4.03 L/sec
FEF 25-75 Pre: 3.11 L/sec
FEF2575-%Change-Post: 29 %
FEF2575-%Pred-Post: 128 %
FEF2575-%Pred-Pre: 98 %
FEV1-%Change-Post: 3 %
FEV1-%Pred-Post: 89 %
FEV1-%Pred-Pre: 86 %
FEV1-Post: 3.22 L
FEV1-Pre: 3.11 L
FEV1FVC-%Change-Post: 4 %
FEV1FVC-%Pred-Pre: 107 %
FEV6-%Change-Post: 0 %
FEV6-%Pred-Post: 83 %
FEV6-%Pred-Pre: 83 %
FEV6-Post: 3.73 L
FEV6-Pre: 3.73 L
FEV6FVC-%Change-Post: 0 %
FEV6FVC-%Pred-Post: 104 %
FEV6FVC-%Pred-Pre: 103 %
FVC-%Change-Post: 0 %
FVC-%Pred-Post: 80 %
FVC-%Pred-Pre: 80 %
FVC-Post: 3.73 L
FVC-Pre: 3.75 L
Post FEV1/FVC ratio: 86 %
Post FEV6/FVC ratio: 100 %
Pre FEV1/FVC ratio: 83 %
Pre FEV6/FVC Ratio: 99 %
RV % pred: 85 %
RV: 1.7 L
TLC % pred: 83 %
TLC: 5.52 L

## 2022-04-09 LAB — CBC WITH DIFFERENTIAL/PLATELET
Abs Immature Granulocytes: 0.01 10*3/uL (ref 0.00–0.07)
Basophils Absolute: 0 10*3/uL (ref 0.0–0.1)
Basophils Relative: 1 %
Eosinophils Absolute: 0.5 10*3/uL (ref 0.0–0.5)
Eosinophils Relative: 7 %
HCT: 38.3 % — ABNORMAL LOW (ref 39.0–52.0)
Hemoglobin: 13.7 g/dL (ref 13.0–17.0)
Immature Granulocytes: 0 %
Lymphocytes Relative: 30 %
Lymphs Abs: 2.1 10*3/uL (ref 0.7–4.0)
MCH: 30.6 pg (ref 26.0–34.0)
MCHC: 35.8 g/dL (ref 30.0–36.0)
MCV: 85.7 fL (ref 80.0–100.0)
Monocytes Absolute: 0.6 10*3/uL (ref 0.1–1.0)
Monocytes Relative: 8 %
Neutro Abs: 3.8 10*3/uL (ref 1.7–7.7)
Neutrophils Relative %: 54 %
Platelets: 170 10*3/uL (ref 150–400)
RBC: 4.47 MIL/uL (ref 4.22–5.81)
RDW: 12.1 % (ref 11.5–15.5)
WBC: 7 10*3/uL (ref 4.0–10.5)
nRBC: 0 % (ref 0.0–0.2)

## 2022-04-09 LAB — PROCALCITONIN: Procalcitonin: 36.37 ng/mL

## 2022-04-09 LAB — MAGNESIUM: Magnesium: 2 mg/dL (ref 1.7–2.4)

## 2022-04-09 LAB — HEPARIN LEVEL (UNFRACTIONATED): Heparin Unfractionated: 0.43 IU/mL (ref 0.30–0.70)

## 2022-04-09 LAB — SURGICAL PCR SCREEN
MRSA, PCR: NEGATIVE
Staphylococcus aureus: NEGATIVE

## 2022-04-09 SURGERY — LEFT HEART CATH AND CORONARY ANGIOGRAPHY
Anesthesia: LOCAL

## 2022-04-09 MED ORDER — ALBUTEROL SULFATE (2.5 MG/3ML) 0.083% IN NEBU
2.5000 mg | INHALATION_SOLUTION | Freq: Once | RESPIRATORY_TRACT | Status: AC
  Administered 2022-04-09: 2.5 mg via RESPIRATORY_TRACT

## 2022-04-09 MED ORDER — HEPARIN SODIUM (PORCINE) 1000 UNIT/ML IJ SOLN
INTRAMUSCULAR | Status: AC
  Filled 2022-04-09: qty 10

## 2022-04-09 MED ORDER — MELATONIN 3 MG PO TABS
3.0000 mg | ORAL_TABLET | Freq: Once | ORAL | Status: DC
Start: 2022-04-09 — End: 2022-04-11

## 2022-04-09 MED ORDER — HEPARIN (PORCINE) 25000 UT/250ML-% IV SOLN
1050.0000 [IU]/h | INTRAVENOUS | Status: DC
  Administered 2022-04-09: 1000 [IU]/h via INTRAVENOUS
  Administered 2022-04-10 – 2022-04-11 (×2): 1050 [IU]/h via INTRAVENOUS
  Filled 2022-04-09 (×3): qty 250

## 2022-04-09 MED ORDER — IOHEXOL 350 MG/ML SOLN
INTRAVENOUS | Status: DC | PRN
  Administered 2022-04-09: 65 mL

## 2022-04-09 MED ORDER — SODIUM CHLORIDE 0.9% FLUSH: 3.0000 mL | INTRAVENOUS | Status: DC | PRN

## 2022-04-09 MED ORDER — SODIUM CHLORIDE 0.9% FLUSH
3.0000 mL | Freq: Two times a day (BID) | INTRAVENOUS | Status: DC
  Administered 2022-04-10: 3 mL via INTRAVENOUS

## 2022-04-09 MED ORDER — SODIUM CHLORIDE 0.9 % WEIGHT BASED INFUSION: 1.0000 mL/kg/h | INTRAVENOUS | Status: DC

## 2022-04-09 MED ORDER — LIDOCAINE HCL (PF) 1 % IJ SOLN
INTRAMUSCULAR | Status: AC
  Filled 2022-04-09: qty 30

## 2022-04-09 MED ORDER — FENTANYL CITRATE (PF) 100 MCG/2ML IJ SOLN
INTRAMUSCULAR | Status: DC | PRN
  Administered 2022-04-09: 25 ug via INTRAVENOUS

## 2022-04-09 MED ORDER — VERAPAMIL HCL 2.5 MG/ML IV SOLN
INTRAVENOUS | Status: AC
  Filled 2022-04-09: qty 2

## 2022-04-09 MED ORDER — HEPARIN SODIUM (PORCINE) 1000 UNIT/ML IJ SOLN
INTRAMUSCULAR | Status: DC | PRN
  Administered 2022-04-09: 4000 [IU] via INTRAVENOUS

## 2022-04-09 MED ORDER — SODIUM CHLORIDE 0.9 % IV SOLN: 250.0000 mL | INTRAVENOUS | Status: DC | PRN

## 2022-04-09 MED ORDER — MIDAZOLAM HCL 2 MG/2ML IJ SOLN
INTRAMUSCULAR | Status: DC | PRN
  Administered 2022-04-09: 2 mg via INTRAVENOUS

## 2022-04-09 MED ORDER — HEPARIN (PORCINE) IN NACL 1000-0.9 UT/500ML-% IV SOLN
INTRAVENOUS | Status: AC
  Filled 2022-04-09: qty 1000

## 2022-04-09 MED ORDER — HEPARIN (PORCINE) IN NACL 1000-0.9 UT/500ML-% IV SOLN
INTRAVENOUS | Status: DC | PRN
  Administered 2022-04-09 (×2): 500 mL

## 2022-04-09 MED ORDER — MIDAZOLAM HCL 2 MG/2ML IJ SOLN
INTRAMUSCULAR | Status: AC
  Filled 2022-04-09: qty 2

## 2022-04-09 MED ORDER — VERAPAMIL HCL 2.5 MG/ML IV SOLN
INTRAVENOUS | Status: DC | PRN
  Administered 2022-04-09: 10 mL via INTRA_ARTERIAL

## 2022-04-09 MED ORDER — FENTANYL CITRATE (PF) 100 MCG/2ML IJ SOLN
INTRAMUSCULAR | Status: AC
  Filled 2022-04-09: qty 2

## 2022-04-09 MED ORDER — LIDOCAINE HCL (PF) 1 % IJ SOLN
INTRAMUSCULAR | Status: DC | PRN
  Administered 2022-04-09: 2 mL

## 2022-04-09 SURGICAL SUPPLY — 9 items
BAND ZEPHYR COMPRESS 30 LONG (HEMOSTASIS) ×1 IMPLANT
CATH 5FR JL3.5 JR4 ANG PIG MP (CATHETERS) ×1 IMPLANT
GLIDESHEATH SLEND SS 6F .021 (SHEATH) ×1 IMPLANT
GUIDEWIRE INQWIRE 1.5J.035X260 (WIRE) IMPLANT
INQWIRE 1.5J .035X260CM (WIRE) ×2
KIT HEART LEFT (KITS) ×2 IMPLANT
PACK CARDIAC CATHETERIZATION (CUSTOM PROCEDURE TRAY) ×2 IMPLANT
TRANSDUCER W/STOPCOCK (MISCELLANEOUS) ×2 IMPLANT
TUBING CIL FLEX 10 FLL-RA (TUBING) ×2 IMPLANT

## 2022-04-09 NOTE — Progress Notes (Signed)
Seen pt and family from [phone],  pt received IS, Cardiac surgery book, OHS care guide, move in tube sheet, and sternal precautions. Pt questions were answered to satisfaction. IS baseline 2500.

## 2022-04-09 NOTE — Progress Notes (Signed)
ANTICOAGULATION CONSULT NOTE   Pharmacy Consult for heparin Indication: chest pain/ACS  No Known Allergies  Patient Measurements: Height: '5\' 8"'$  (172.7 cm) Weight: 73 kg (160 lb 14.4 oz) IBW/kg (Calculated) : 68.4 Heparin Dosing Weight: 73.5 kg  Vital Signs: Temp: 97.7 F (36.5 C) (05/30 0822) Temp Source: Oral (05/30 0822) BP: 110/65 (05/30 0822) Pulse Rate: 55 (05/30 0822)  Labs: Recent Labs    04/06/22 1433 04/06/22 1633 04/07/22 0314 04/07/22 0949 04/07/22 1122 04/07/22 1522 04/07/22 1740 04/07/22 2050 04/08/22 0139 04/09/22 0332  HGB 13.0  --  12.8*  --   --   --   --   --  12.8* 13.7  HCT 36.1*  --  35.9*  --   --   --   --   --  36.4* 38.3*  PLT 152  --  169  --   --   --   --   --  162 170  HEPARINUNFRC  --   --  0.20*  --    < >  --  0.69  --  0.53 0.43  CREATININE 0.95  --  0.84  --   --   --   --   --  0.85 0.89  CKTOTAL 63  --   --   --   --   --   --   --   --   --   TROPONINIHS 25*   < > 2,302* 1,579*  --  1,148*  --  924*  --   --    < > = values in this interval not displayed.     Estimated Creatinine Clearance: 92.9 mL/min (by C-G formula based on SCr of 0.89 mg/dL).   Medical History: Past Medical History:  Diagnosis Date   CAD (coronary artery disease)    a. s/p MI @ age 84 with PCI of the RCA;  b. CTO of the LAD;  c. 11/2013 NSTEMI/PCI: LM nl, LAD 100 L->L collats, LCX 50p, OM2 80p, OM1 60ost, RCA 100p (3.5x38 Promus DES), RPL 99/75mPTCA only, EF 40-50%;  c. 11/2013 Echo: EF 50-55%, Gr1 DD, mild MR.   HYPERLIPIDEMIA    HYPERTENSION    Kidney stone 11/2021   MITRAL REGURGITATION    Myocardial infarction ([facility]    at age [age]  Tobacco abuse     Assessment: 89YOM presenting with near syncope, hx CAD, he is not on anticoagulation PTA.  HL 0.43 therapeutic this AM on heparin 1000 units/hour. CBC wnl stable.   S/p left heart cath today 5/30 found severe 3 vessel obstructive CAD.  TCTS consult ordered, needs CABG.   Pharmacy consulted to  restart heparin 2 hours post TR band removal.  Sheath removed/TR band placed 0806, no bleeding or hematoma.  Will follow up for time TR band off.  Goal of Therapy:  Heparin level 0.3-0.7 units/ml Monitor platelets by anticoagulation protocol: Yes   Plan:  F/u for time TR band off. Restart heparin 2 hr post radial band removal, heparin 1000 units/hr.  Check heparin level 6-8 hours post restart.  Daily HL and CBC    Thank you for allowing pharmacy to participate in this patient's care.  RNicole Cella RPh Clinical Pharmacist [phone] 25405/30/2023 8:42 AM Check [email or web address] for unit specific pharmacy number

## 2022-04-09 NOTE — Interval H&P Note (Signed)
History and Physical Interval Note:  04/09/2022 7:30 AM  Austin Vincent  has presented today for surgery, with the diagnosis of chest pain.  The various methods of treatment have been discussed with the patient and family. After consideration of risks, benefits and other options for treatment, the patient has consented to  Procedure(s): LEFT HEART CATH AND CORONARY ANGIOGRAPHY (N/A) as a surgical intervention.  The patient's history has been reviewed, patient examined, no change in status, stable for surgery.  I have reviewed the patient's chart and labs.  Questions were answered to the patient's satisfaction.   Cath Lab Visit (complete for each Cath Lab visit)  Clinical Evaluation Leading to the Procedure:   ACS: Yes.    Non-ACS:    Anginal Classification: CCS IV  Anti-ischemic medical therapy: Maximal Therapy (2 or more classes of medications)  Non-Invasive Test Results: No non-invasive testing performed  Prior CABG: No previous CABG        Austin Vincent [facility] 04/09/2022 7:31 AM

## 2022-04-09 NOTE — Progress Notes (Signed)
TCTS consulted for CABG evaluation. °

## 2022-04-09 NOTE — Progress Notes (Signed)
Progress Note    Austin Vincent   GYF:[id number]  DOB: Apr 21, 1971  DOA: 04/06/2022     2 PCP: Haydee Salter, MD  Initial CC: syncope  Hospital Course: Austin Vincent is a 51 yo male with newly diagnosed medullary thyroid carcinoma, CAD, HTN, HLD, tobacco use who presented after a syncopal episode in the car. He was already stopped when having the episode and noted associated diaphoresis as well.  He regained consciousness when a bystander knocked on his window as well.  He was brought to the ER via EMS for further work-up and evaluation. Of note, he also has been monitored for nephrolithiasis and may have also passed a stone approximately 2 days prior to admission as he had associated groin pain, bloody urine output and probable passage of a stone.  Urine has since cleared after this.  On work-up, EKG showed new T wave inversions, and troponins trended up.  He had no chest pain during work-up.  He was started on a heparin drip and cardiology was consulted.  CT angio chest/abdomen/pelvis was also obtained to rule out any vascular abnormalities; this was negative for aortic aneurysm or dissection.  Coronary artery calcifications were noted.  There was also clustered nodular consolidation involving right upper lobe with surrounding groundglass nodules measuring up to 12 mm. Due to some concern for possible aspiration he was also started on Unasyn.  Procalcitonin was also elevated. He underwent heart cath on 04/09/2022 which showed severe three-vessel CAD.  Cardiothoracic surgery was then consulted.  Interval History:  No events overnight. Seen after cath; appropriately frustrated over findings but understands need for surgery evaluation.   Assessment and Plan: * Syncope - Syncopal episode sounds to have been related to cardiac etiology with ensuing NSTEMI; of note, does have a history of bradycardia as well in the 50s looking thru prior office visits -Does not appear to have had  seizure-like activity nor postictal on presentation.  Orthostatics not performed on admission but negative on 5/29 -echo shows normal EF 50-55%, normal diastology; distal septal/apical hypokinesis apex with calcified band/chords; no AS/MS,no LVH -Patient instructed no driving for 6 months following event per cardiology recommendations  Non-ST elevation (NSTEMI) myocardial infarction (HCC) - hx CAD with stenting at age 80 (1999) - TWI on EKG noted on admission; uptrending trops; continue trending to peak -Has remained chest pain-free and on heparin drip - echo shows normal EF 50-55%, normal diastology; distal septal/apical hypokinesis apex with calcified band/chords; no AS/MS,no LVH - Continue heparin drip -Unfortunately, heart cath performed 04/09/2022 reveals severe three-vessel CAD - TCTS consulted; tentative plan appears to be inpatient CABG  Pulmonary nodules - Clustered nodular consolidation noted in right upper lobe with surrounding groundglass opacities noted on CT on admission.  Nodules are new compared to CT performed 01/23/2022.  Nodules measure up to 12 mm per read -Given underlying new medullary thyroid carcinoma, this may need further evaluation but currently NSTEMI taking precedence -Patient and wife are aware of findings and will plan for outpatient follow-up; discussed personally on 04/08/22  Ground glass opacity present on imaging of lung - Noted on CT angio chest.  Patient may have aspirated during syncopal event is 1 differential.  See pulmonary nodule discussion as well. - Given mild leukocytosis on admission and elevated procalcitonin, treating empirically for aspiration coverage at this time - Continue Unasyn and will complete empiric Augmentin course - Continue trending procalcitonin  Nephrolithiasis - History of nephrolithiasis in past requiring lithotripsy -Patient appears to have been  symptomatic the past 3 to 4 weeks and appears he may have passed a stone  approximately 2 days prior to admission with associated pain, hematuria which has all now resolved -Patient began developing some abdominal discomfort concerning for recurrent stone on 04/08/2022.  Repeat CT renal stone protocol performed which was negative for nephrolithiasis nor any obstructing stones  Medullary thyroid carcinoma ([place]) - Diagnosed on FNA on 03/07/2022 - Patient following with ENT and endocrinology.  Currently no date set for thyroidectomy; seen by Dr. Constance Holster with ENT on 03/21/22  Essential hypertension - Continue amlodipine and Toprol  Hyperlipidemia Continue high intensity statin +zetia   Recent LDL <70  Atherosclerotic vascular disease - per CT: "Extensive abdominopelvic aortic atherosclerotic disease with branch vessel involvement. Areas of noncritical stenosis are noted involving the superior mesenteric artery, inferior mesenteric artery, and right renal artery." - Continue high intensity statin, ASA    Old records reviewed in assessment of this patient  Antimicrobials:   DVT prophylaxis:    Heparin drip   Code Status:   Code Status: Full Code  Disposition Plan: TBD, getting CABG eval Status is: Inpatient  Objective: Blood pressure 134/82, pulse (!) 56, temperature 97.7 F (36.5 C), temperature source Oral, resp. rate 12, height '5\' 8"'$  (1.727 m), weight 73 kg, SpO2 95 %.  Examination:  Physical Exam Constitutional:      General: He is not in acute distress.    Appearance: Normal appearance.  HENT:     Head: Normocephalic and atraumatic.     Mouth/Throat:     Mouth: Mucous membranes are moist.  Eyes:     Extraocular Movements: Extraocular movements intact.  Cardiovascular:     Rate and Rhythm: Normal rate and regular rhythm.     Heart sounds: Normal heart sounds.  Pulmonary:     Effort: Pulmonary effort is normal. No respiratory distress.     Breath sounds: Normal breath sounds. No wheezing.  Abdominal:     General: Bowel sounds are normal.  There is no distension.     Palpations: Abdomen is soft.     Tenderness: There is no abdominal tenderness.  Musculoskeletal:        General: Normal range of motion.     Cervical back: Normal range of motion and neck supple.  Skin:    General: Skin is warm and dry.  Neurological:     General: No focal deficit present.     Mental Status: He is alert.  Psychiatric:        Mood and Affect: Mood normal.        Behavior: Behavior normal.     Consultants:  Cardiology  Procedures:  Heart cath, 04/09/2022  Data Reviewed: Results for orders placed or performed during the hospital encounter of 04/06/22 (from the past 24 hour(s))  Heparin level (unfractionated)     Status: None   Collection Time: 04/09/22  3:32 AM  Result Value Ref Range   Heparin Unfractionated 0.43 0.30 - 0.70 IU/mL  Basic metabolic panel     Status: None   Collection Time: 04/09/22  3:32 AM  Result Value Ref Range   Sodium 135 135 - 145 mmol/L   Potassium 4.2 3.5 - 5.1 mmol/L   Chloride 105 98 - 111 mmol/L   CO2 22 22 - 32 mmol/L   Glucose, Bld 99 70 - 99 mg/dL   BUN 10 6 - 20 mg/dL   Creatinine, Ser 0.89 0.61 - 1.24 mg/dL   Calcium 8.9 8.9 - 10.3  mg/dL   GFR, Estimated >60 >60 mL/min   Anion gap 8 5 - 15  CBC with Differential/Platelet     Status: Abnormal   Collection Time: 04/09/22  3:32 AM  Result Value Ref Range   WBC 7.0 4.0 - 10.5 K/uL   RBC 4.47 4.22 - 5.81 MIL/uL   Hemoglobin 13.7 13.0 - 17.0 g/dL   HCT 38.3 (L) 39.0 - 52.0 %   MCV 85.7 80.0 - 100.0 fL   MCH 30.6 26.0 - 34.0 pg   MCHC 35.8 30.0 - 36.0 g/dL   RDW 12.1 11.5 - 15.5 %   Platelets 170 150 - 400 K/uL   nRBC 0.0 0.0 - 0.2 %   Neutrophils Relative % 54 %   Neutro Abs 3.8 1.7 - 7.7 K/uL   Lymphocytes Relative 30 %   Lymphs Abs 2.1 0.7 - 4.0 K/uL   Monocytes Relative 8 %   Monocytes Absolute 0.6 0.1 - 1.0 K/uL   Eosinophils Relative 7 %   Eosinophils Absolute 0.5 0.0 - 0.5 K/uL   Basophils Relative 1 %   Basophils Absolute 0.0 0.0  - 0.1 K/uL   Immature Granulocytes 0 %   Abs Immature Granulocytes 0.01 0.00 - 0.07 K/uL  Magnesium     Status: None   Collection Time: 04/09/22  3:32 AM  Result Value Ref Range   Magnesium 2.0 1.7 - 2.4 mg/dL  Procalcitonin     Status: None   Collection Time: 04/09/22  3:32 AM  Result Value Ref Range   Procalcitonin 36.37 ng/mL    I have Reviewed nursing notes, Vitals, and Lab results since pt's last encounter. Pertinent lab results : see above I have ordered test including BMP, CBC, Mg I have reviewed the last note from staff over past 24 hours I have discussed pt's care plan and test results with nursing staff, case manager   LOS: 2 days   Dwyane Dee, MD [facility] 04/09/2022, 12:57 PM

## 2022-04-09 NOTE — Progress Notes (Signed)
Pharmacy Antibiotic Note  Austin Vincent is a 51 y.o. male admitted on 04/06/2022 with concern for aspiration pneumonia. Pharmacy consulted 5/27 for unasyn dosing.  Day #4 of empiric Unasyn.  SCr is stable within normal.  Mild leukocytosis on admit 5/27 has improved to within normal limits. Afebrile.   Plan: Unasyn 1.5g q6h F/u renal function, clinical course, and length of therapy  Height: '5\' 8"'$  (172.7 cm) Weight: 73 kg (160 lb 14.4 oz) IBW/kg (Calculated) : 68.4  Temp (24hrs), Avg:97.6 F (36.4 C), Min:97.4 F (36.3 C), Max:97.7 F (36.5 C)  Recent Labs  Lab 04/03/22 1623 04/06/22 1433 04/07/22 0314 04/08/22 0139 04/09/22 0332  WBC 8.3 12.7* 7.2 6.6 7.0  CREATININE 0.92 0.95 0.84 0.85 0.89     Estimated Creatinine Clearance: 92.9 mL/min (by C-G formula based on SCr of 0.89 mg/dL).    No Known Allergies  Antimicrobials this admission: Unasyn 5/27 >  Dose adjustments this admission:  Microbiology results:  No cultures  Thank you for allowing pharmacy to be a part of this patient's care.  Nicole Cella, RPh Clinical Pharmacist 04/09/2022 8:52 AM Please check AMION for all [place] phone numbers After 10:00 PM, call [place] [phone]

## 2022-04-09 NOTE — Progress Notes (Signed)
Progress Note  Patient Name: Austin Vincent Date of Encounter: 04/09/2022  [facility] Cardiologist: Kirk Ruths, MD   Subjective   No acute events overnight.  Patient remains chest pain free.  Cath today with MVD.  Inpatient Medications    Scheduled Meds:  amLODipine  2.5 mg Oral Daily   aspirin EC  81 mg Oral Daily   atorvastatin  80 mg Oral Daily   ezetimibe  10 mg Oral Daily   sodium chloride flush  3 mL Intravenous Q12H   sodium chloride flush  3 mL Intravenous Q12H   sodium chloride flush  3 mL Intravenous Q12H   Continuous Infusions:  sodium chloride     sodium chloride     sodium chloride     ampicillin-sulbactam (UNASYN) IV 1.5 g (04/09/22 0612)   heparin 1,000 Units/hr (04/08/22 1643)   PRN Meds: sodium chloride, sodium chloride, acetaminophen **OR** acetaminophen, nitroGLYCERIN, sodium chloride flush, sodium chloride flush   Vital Signs    Vitals:   04/09/22 0755 04/09/22 0800 04/09/22 0805 04/09/22 0822  BP: 110/63 110/70 124/69 110/65  Pulse: (!) 53 61 (!) 0 (!) 55  Resp: '14 16  18  '$ Temp:    97.7 F (36.5 C)  TempSrc:    Oral  SpO2: 95% 95% 97% 95%  Weight:      Height:        Intake/Output Summary (Last 24 hours) at 04/09/2022 0901 Last data filed at 04/09/2022 6734 Gross per 24 hour  Intake 1174.19 ml  Output 3300 ml  Net -2125.81 ml      04/09/2022    5:15 AM 04/08/2022    5:00 AM 04/07/2022    6:28 AM  Last 3 Weights  Weight (lbs) 160 lb 14.4 oz 165 lb 2 oz 165 lb 3.2 oz  Weight (kg) 72.984 kg 74.9 kg 74.934 kg      Telemetry    Sinus bradycardia- Personally Reviewed  Physical Exam   GEN: No acute distress.   Neck: No JVD Cardiac: RRR, no murmurs, rubs, or gallops.  Respiratory: Clear to auscultation bilaterally. GI: Soft, nontender, non-distended  MS: No edema Neuro:  Nonfocal  Psych: Normal affect   Labs    High Sensitivity Troponin:   Recent Labs  Lab 04/06/22 2055 04/07/22 0314 04/07/22 0949  04/07/22 1522 04/07/22 2050  TROPONINIHS 1,433* 2,302* 1,579* 1,148* 924*     Chemistry Recent Labs  Lab 04/06/22 1433 04/07/22 0314 04/08/22 0139 04/09/22 0332  NA 136 139 137 135  K 3.8 3.9 4.1 4.2  CL 103 108 108 105  CO2 '25 25 24 22  '$ GLUCOSE 119* 105* 113* 99  BUN '12 13 11 10  '$ CREATININE 0.95 0.84 0.85 0.89  CALCIUM 8.9 8.7* 8.4* 8.9  MG  --   --  1.9 2.0  PROT 6.4*  --   --   --   ALBUMIN 4.0  --   --   --   AST 31  --   --   --   ALT 37  --   --   --   ALKPHOS 51  --   --   --   BILITOT 0.9  --   --   --   GFRNONAA >60 >60 >60 >60  ANIONGAP '8 6 5 8     '$ Hematology Recent Labs  Lab 04/07/22 0314 04/08/22 0139 04/09/22 0332  WBC 7.2 6.6 7.0  RBC 4.14* 4.19* 4.47  HGB 12.8* 12.8* 13.7  HCT  35.9* 36.4* 38.3*  MCV 86.7 86.9 85.7  MCH 30.9 30.5 30.6  MCHC 35.7 35.2 35.8  RDW 12.0 12.1 12.1  PLT 169 162 170    DDimer  Recent Labs  Lab 04/06/22 1449  DDIMER <0.27     Radiology    CARDIAC CATHETERIZATION  Result Date: 04/09/2022   Mid LAD lesion is 100% stenosed.   1st Diag lesion is 90% stenosed.   1st Mrg lesion is 50% stenosed.   Mid Cx lesion is 90% stenosed.   2nd Mrg lesion is 90% stenosed.   Ost RCA to Mid RCA lesion is 95% stenosed.   Mid RCA to Dist RCA lesion is 30% stenosed.   There is mild left ventricular systolic dysfunction.   LV end diastolic pressure is normal.   The left ventricular ejection fraction is 50-55% by visual estimate. Severe 3 vessel obstructive CAD Mildly reduced LV function. Inferoapical HK Normal LVEDP Plan: Needs CT surgery consult for CABG.   ECHOCARDIOGRAM COMPLETE  Result Date: 04/07/2022    ECHOCARDIOGRAM REPORT   Patient Name:   Austin Vincent [facility] Date of Exam: 04/07/2022 Medical Rec #:  [id number]           Height:       68.0 in Accession #:    [id number]          Weight:       165.2 lb Date of Birth:  February 14, 1971            BSA:          1.884 m Patient Age:    51 years            BP:           102/59 mmHg Patient  Gender: M                   HR:           58 bpm. Exam Location:  Inpatient Procedure: 2D Echo, Cardiac Doppler and Color Doppler Indications:    Syncope  History:        Patient has prior history of Echocardiogram examinations, most                 recent 12/01/2013. CAD and Previous Myocardial Infarction,                 Signs/Symptoms:Syncope; Risk Factors:Dyslipidemia, Hypertension                 and Current Smoker.  Sonographer:    Wenda Low Referring Phys: [id number] [place]  1. Can consider f/u contrast imaging to look at apex if clinically indicated.  2. Distal septal / apical hypokinesis Apex with calcified band/chords doubt thrombus. Left ventricular ejection fraction, by estimation, is 50 to 55%. The left ventricle has low normal function. The left ventricle demonstrates regional wall motion abnormalities (see scoring diagram/findings for description). Left ventricular diastolic parameters were normal.  3. Right ventricular systolic function is normal. The right ventricular size is normal.  4. Left atrial size was mildly dilated.  5. The mitral valve is abnormal. Trivial mitral valve regurgitation. No evidence of mitral stenosis.  6. The aortic valve is tricuspid. There is mild calcification of the aortic valve. Aortic valve regurgitation is not visualized. No aortic stenosis is present.  7. The inferior vena cava is normal in size with greater than 50% respiratory variability, suggesting right atrial pressure of 3 mmHg. FINDINGS  Left Ventricle: Distal  septal / apical hypokinesis Apex with calcified band/chords doubt thrombus. Left ventricular ejection fraction, by estimation, is 50 to 55%. The left ventricle has low normal function. The left ventricle demonstrates regional wall  motion abnormalities. The left ventricular internal cavity size was normal in size. There is no left ventricular hypertrophy. Left ventricular diastolic parameters were normal. Right Ventricle: The  right ventricular size is normal. No increase in right ventricular wall thickness. Right ventricular systolic function is normal. Left Atrium: Left atrial size was mildly dilated. Right Atrium: Right atrial size was normal in size. Pericardium: There is no evidence of pericardial effusion. Mitral Valve: The mitral valve is abnormal. There is mild thickening of the mitral valve leaflet(s). Trivial mitral valve regurgitation. No evidence of mitral valve stenosis. MV peak gradient, 5.4 mmHg. The mean mitral valve gradient is 2.0 mmHg. Tricuspid Valve: The tricuspid valve is normal in structure. Tricuspid valve regurgitation is trivial. No evidence of tricuspid stenosis. Aortic Valve: The aortic valve is tricuspid. There is mild calcification of the aortic valve. Aortic valve regurgitation is not visualized. No aortic stenosis is present. Aortic valve mean gradient measures 4.0 mmHg. Aortic valve peak gradient measures 9.0 mmHg. Aortic valve area, by VTI measures 1.88 cm. Pulmonic Valve: The pulmonic valve was normal in structure. Pulmonic valve regurgitation is trivial. No evidence of pulmonic stenosis. Aorta: The aortic root is normal in size and structure. Venous: The inferior vena cava is normal in size with greater than 50% respiratory variability, suggesting right atrial pressure of 3 mmHg. IAS/Shunts: No atrial level shunt detected by color flow Doppler. Additional Comments: Can consider f/u contrast imaging to look at apex if clinically indicated.  LEFT VENTRICLE PLAX 2D LVIDd:         4.90 cm     Diastology LVIDs:         3.50 cm     LV e' medial:    7.51 cm/s LV PW:         1.00 cm     LV E/e' medial:  14.1 LV IVS:        1.10 cm     LV e' lateral:   12.90 cm/s LVOT diam:     1.90 cm     LV E/e' lateral: 8.2 LV SV:         66 LV SV Index:   35 LVOT Area:     2.84 cm  LV Volumes (MOD) LV vol d, MOD A2C: 76.2 ml LV vol d, MOD A4C: 74.8 ml LV vol s, MOD A2C: 31.6 ml LV vol s, MOD A4C: 34.8 ml LV SV MOD A2C:      44.6 ml LV SV MOD A4C:     74.8 ml LV SV MOD BP:      43.0 ml RIGHT VENTRICLE RV Basal diam:  3.25 cm RV Mid diam:    2.80 cm RV S prime:     12.80 cm/s TAPSE (M-mode): 2.4 cm LEFT ATRIUM             Index        RIGHT ATRIUM           Index LA diam:        4.10 cm 2.18 cm/m   RA Area:     17.30 cm LA Vol (A2C):   50.5 ml 26.80 ml/m  RA Volume:   43.10 ml  22.87 ml/m LA Vol (A4C):   59.3 ml 31.47 ml/m LA Biplane Vol: 57.8 ml 30.68 ml/m  AORTIC VALVE                    PULMONIC VALVE AV Area (Vmax):    1.97 cm     PV Vmax:       0.90 m/s AV Area (Vmean):   1.87 cm     PV Peak grad:  3.2 mmHg AV Area (VTI):     1.88 cm AV Vmax:           150.00 cm/s AV Vmean:          95.700 cm/s AV VTI:            0.352 m AV Peak Grad:      9.0 mmHg AV Mean Grad:      4.0 mmHg LVOT Vmax:         104.00 cm/s LVOT Vmean:        63.100 cm/s LVOT VTI:          0.234 m LVOT/AV VTI ratio: 0.66  AORTA Ao Root diam: 3.50 cm Ao Asc diam:  3.00 cm MITRAL VALVE MV Area (PHT): 2.95 cm     SHUNTS MV Area VTI:   1.48 cm     Systemic VTI:  0.23 m MV Peak grad:  5.4 mmHg     Systemic Diam: 1.90 cm MV Mean grad:  2.0 mmHg MV Vmax:       1.16 m/s MV Vmean:      58.4 cm/s MV Decel Time: 257 msec MV E velocity: 106.00 cm/s MV A velocity: 82.70 cm/s MV E/A ratio:  1.28 Austin Rouge MD Electronically signed by Austin Rouge MD Signature Date/Time: 04/07/2022/10:52:24 AM    Final    CT RENAL STONE STUDY  Result Date: 04/08/2022 CLINICAL DATA:  Flank pain.  Kidney stone suspected. EXAM: CT ABDOMEN AND PELVIS WITHOUT CONTRAST TECHNIQUE: Multidetector CT imaging of the abdomen and pelvis was performed following the standard protocol without IV contrast. RADIATION DOSE REDUCTION: This exam was performed according to the departmental dose-optimization program which includes automated exposure control, adjustment of the mA and/or kV according to patient size and/or use of iterative reconstruction technique. COMPARISON:  CT a chest, abdomen and  pelvis dated 04/06/2022. FINDINGS: Lower chest: No acute abnormality. Hepatobiliary: No focal liver abnormality is seen. No gallstones, gallbladder wall thickening, or biliary dilatation. Pancreas: Unremarkable. No pancreatic ductal dilatation or surrounding inflammatory changes. Spleen: Normal in size without focal abnormality. Adrenals/Urinary Tract: No adrenal masses. Kidneys are normal in size, orientation and position. No renal masses, stones or hydronephrosis. Ureters are normal in course and in caliber. No ureteral stones. Normal bladder. Stomach/Bowel: Stomach moderately distended, otherwise unremarkable. Small bowel and colon are normal in caliber. No wall thickening. No inflammation. Multiple colonic diverticula. Normal appendix. Vascular/Lymphatic: Aortic atherosclerosis. No aneurysm. No enlarged lymph nodes. Reproductive: Unremarkable. Other: No abdominal wall hernia or abnormality. No abdominopelvic ascites. Musculoskeletal: No fracture or acute finding.  No bone lesion. IMPRESSION: 1. No acute findings within the abdomen or pelvis. No renal or ureteral stones or obstructive uropathy. No findings to account for flank pain. 2. Colonic diverticula without evidence of diverticulitis. 3. Aortic atherosclerosis. Electronically Signed   By: Lajean Manes M.D.   On: 04/08/2022 16:11     Patient Profile     51 y.o. male with history of CAD s/p PCI to RCA at age 71 with NSTEMI in 2015 found to have occluded LAD (chronic) and RCA s/p PCI to RCA since that time, HTN, HLD and  medullary thyroid cancer who presented with episode of syncope found to have elevated troponin and TWI in inferolateral leads for which Cardiology was consulted.  Troponins elevated.  Echocardiogram shows ejection fraction 50 to 55%, distal septal/apical hypokinesis, mild left atrial enlargement.  Assessment & Plan    1 non-ST elevation myocardial infarction-patient admitted with syncope.  Cor angiography today with MVD.  CTS  consulted.  Cont ASA, statin, hep gtt for now.  No BB given rate.  2 syncope--Unclear etiology; TTE with normal EF. Previously instructed that patient will not be able to drive for 6 months following recent event.  No evidence of NSVT/VT on monitor.  3 coronary artery disease-continue aspirin and statin.  4 medullary thyroid cancer-patient will need surgical resection after MVD addressed  5 hypertension-blood pressure controlled.  Continue present medications.  6 hyperlipidemia-continue statin.  7 abnormal chest CT-will need follow-up noncontrast chest CT in 3 months.  For questions or updates, please contact [place] Please consult [email or web address] for contact info under        Signed, Early Osmond, MD  04/09/2022, 9:01 AM   Patient ID: Gerda Diss Godman, male   DOB: July 12, 1971, 51 y.o.   MRN: [id number]

## 2022-04-09 NOTE — Consult Note (Addendum)
[address]       [place] [address]             [phone]        [name] Medical Record #[id number] Date of Birth: October 06, 1971  Referring: No ref. provider found Primary Care: Haydee Salter, MD Primary Cardiologist:Brian Stanford Breed, MD  Chief Complaint:    Chief Complaint  Patient presents with   Loss of Consciousness    History of Present Illness:   We are asked to see this 51 year old male in cardiothoracic surgical consultation for consideration of coronary artery surgical revascularization.  Patient has a long history of CAD having had his first event in 61 at age 26.  At that time he had an inferior myocardial infarction with stenting of the right coronary artery.  He was admitted in 2015 with a non-ST elevation myocardial infarction at which time was found to have an occluded LAD (chronic) and the RCA was also occluded.  Ejection fraction at that time was 45 to 55%.  He underwent PCI of the right coronary and PDA.  Echocardiogram in January 2015 showed ejection fraction of 50 to 55% with grade 1 diastolic dysfunction and mild mitral regurgitation.  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  Patient does experience occasional anginal equivalents of throat tightness which is similar to his previous infarct pain.  He also has elevation of his heart rate at times with exertion.  He presented to the emergency department on 04/06/2022 with a loss of consciousness.  The patient states he had not eaten during the day and while he was driving he was feeling very hungry and developed dizziness, lightheadedness, diaphoresis causing him to pull over to the side of the road.  He apparently lost consciousness and a bystander found him to be confused and he was noted to be incontinent of urine.  He  denied chest pain or shortness of breath.  He has no previous history of seizure or syncope.  EKG noted mild ST elevation in V2 and V3.  Cardiology consultation was obtained.  High-sensitivity troponin has peaked at 2302.  He was admitted for further medical stabilization and diagnostic evaluation.  Echocardiogram has been performed and the full report as described below.  Left ventricular ejection fraction by estimation is 50 to 55%.  Cardiac catheterization done today shows severe three-vessel obstructive disease with mildly reduced LV function, and for apical hypokinesis and normal LVEDP.    Current Activity/ Functional Status: Patient is independent with mobility/ambulation, transfers, ADL's, IADL's.   Zubrod Score: At the time of surgery this patient's most appropriate activity status/level should be described as: '[]'$     0    Normal activity, no symptoms '[x]'$     1    Restricted in physical strenuous activity but ambulatory, able to do out light work '[]'$     2    Ambulatory and capable of self care, unable to do work activities, up and about                 more than 50%  Of the time                            '[]'$     3    Only limited  self care, in bed greater than 50% of waking hours '[]'$     4    Completely disabled, no self care, confined to bed or chair '[]'$     5    Moribund  Past Medical History:  Diagnosis Date   CAD (coronary artery disease)    a. s/p MI @ age 89 with PCI of the RCA;  b. CTO of the LAD;  c. 11/2013 NSTEMI/PCI: LM nl, LAD 100 L->L collats, LCX 50p, OM2 80p, OM1 60ost, RCA 100p (3.5x38 Promus DES), RPL 99/33mPTCA only, EF 40-50%;  c. 11/2013 Echo: EF 50-55%, Gr1 DD, mild MR.   HYPERLIPIDEMIA    HYPERTENSION    Kidney stone 11/2021   MITRAL REGURGITATION    Myocardial infarction ([facility]    at age [age]  Tobacco abuse     Past Surgical History:  Procedure Laterality Date   COLONOSCOPY  02/12/2022   CORONARY STENT PLACEMENT     LEFT HEART CATHETERIZATION WITH CORONARY  ANGIOGRAM N/A 12/01/2013   Procedure: LEFT HEART CATHETERIZATION WITH CORONARY ANGIOGRAM;  Surgeon: DLeonie Man MD;  [facility];  Service: Cardiovascular;  Laterality: N/A;   LITHOTRIPSY  2003    Social History   Tobacco Use  Smoking Status Former   Types: Cigarettes   Quit date: 2021   Years since quitting: 2.4   Passive exposure: Past (younger)  Smokeless Tobacco Never  Tobacco Comments   Nicotine pouches- 10/day--no tobacco    Social History   Substance and Sexual Activity  Alcohol Use Yes   Alcohol/week: 2.0 standard drinks   Types: 2 Cans of beer per week   Comment: socially     No Known Allergies  Current Facility-Administered Medications  Medication Dose Route Frequency Provider Last Rate Last Admin   0.9 %  sodium chloride infusion  250 mL Intravenous PRN WOrma Flaming MD       0.9 %  sodium chloride infusion  250 mL Intravenous PRN JMartinique Obie Silos M, MD       0.9% sodium chloride infusion  1 mL/kg/hr Intravenous Continuous JMartinique Jezabel Lecker M, MD 73 mL/hr at 04/09/22 0911 1 mL/kg/hr at 04/09/22 0911   acetaminophen (TYLENOL) tablet 650 mg  650 mg Oral Q6H PRN WOrma Flaming MD   650 mg at 04/08/22 1631   Or   acetaminophen (TYLENOL) suppository 650 mg  650 mg Rectal Q6H PRN WOrma Flaming MD       amLODipine (NORVASC) tablet 2.5 mg  2.5 mg Oral Daily WOrma Flaming MD   2.5 mg at 04/09/22 0921   ampicillin-sulbactam (UNASYN) 1.5 g in sodium chloride 0.9 % 100 mL IVPB  1.5 g Intravenous Q6H WLevonne Spiller RPH 200 mL/hr at 04/09/22 0612 1.5 g at 04/09/22 0612   aspirin EC tablet 81 mg  81 mg Oral Daily WOrma Flaming MD   81 mg at 04/08/22 0936   atorvastatin (LIPITOR) tablet 80 mg  80 mg Oral Daily WOrma Flaming MD   80 mg at 04/09/22 07169  ezetimibe (ZETIA) tablet 10 mg  10 mg Oral Daily WOrma Flaming MD   10 mg at 04/09/22 06789  heparin ADULT infusion 100 units/mL (25000 units/259m  1,000 Units/hr Intravenous Continuous WiLyndee LeoRPRiverview Surgery Center LLC  Held at 04/09/22 093810 nitroGLYCERIN (NITROSTAT) SL tablet 0.4 mg  0.4 mg Sublingual Q5 min PRN WoOrma FlamingMD       sodium chloride flush (NS) 0.9 % injection 3 mL  3  mL Intravenous Q12H Orma Flaming, MD       sodium chloride flush (NS) 0.9 % injection 3 mL  3 mL Intravenous Q12H Orma Flaming, MD   3 mL at 04/08/22 0936   sodium chloride flush (NS) 0.9 % injection 3 mL  3 mL Intravenous PRN Orma Flaming, MD       sodium chloride flush (NS) 0.9 % injection 3 mL  3 mL Intravenous Q12H [place], Waniya Hoglund M, MD       sodium chloride flush (NS) 0.9 % injection 3 mL  3 mL Intravenous PRN [place], Urania Pearlman M, MD        Facility-Administered Medications Prior to Admission  Medication Dose Route Frequency Provider Last Rate Last Admin   sodium chloride flush (NS) 0.9 % injection 3 mL  3 mL Intravenous Q12H Lelon Perla, MD       Medications Prior to Admission  Medication Sig Dispense Refill Last Dose   amLODipine (NORVASC) 2.5 MG tablet Take 1 tablet (2.5 mg total) by mouth daily. 90 tablet 3 04/06/2022   aspirin 81 MG tablet Take 81 mg by mouth daily.    04/06/2022   atorvastatin (LIPITOR) 80 MG tablet Take 0.5 tablets (40 mg total) by mouth daily. (Patient taking differently: Take 80 mg by mouth daily.) 90 tablet 3 04/06/2022   ezetimibe (ZETIA) 10 MG tablet Take 1 tablet (10 mg total) by mouth daily. 90 tablet 3 04/06/2022   hydrocodone-ibuprofen (VICOPROFEN) 5-200 MG tablet Take 1 tablet by mouth every 8 (eight) hours as needed for pain. 15 tablet 0 04/05/2022   nitroGLYCERIN (NITROSTAT) 0.4 MG SL tablet Dissolve 1 tablet under the tongue every 5 minutes as  needed for chest pain (Patient taking differently: 0.4 mg every 5 (five) minutes as needed for chest pain.) 25 tablet 0 unk last dose   Simethicone (GAS-X PO) Take 1 tablet by mouth daily as needed (gas).   04/06/2022   tamsulosin (FLOMAX) 0.4 MG CAPS capsule Take 1 capsule (0.4 mg total) by mouth daily. (Patient taking differently: Take 0.4  mg by mouth daily as needed (when needed to pass urine).) 14 capsule 0 04/05/2022   metoprolol succinate (TOPROL-XL) 25 MG 24 hr tablet Take 1 tablet (25 mg total) by mouth daily. 90 tablet 3 04/04/2022 at 8 am   phenazopyridine (PYRIDIUM) 100 MG tablet Take 1 tablet (100 mg total) by mouth 3 (three) times daily with meals. (Patient not taking: Reported on 04/04/2022) 21 tablet 0     Family History  Problem Relation Age of Onset   Cancer Mother        Thyms   Heart disease Mother    Hypertension Mother    COPD Mother    Cancer Father        skin, prostrate   Diabetes Father        Type 1   Heart disease Father    Hypertension Father    Cancer Sister        Skin   Coronary artery disease Other    Colon cancer Neg Hx    Colon polyps Neg Hx    Esophageal cancer Neg Hx    Rectal cancer Neg Hx    Stomach cancer Neg Hx      Review of Systems:   Review of Systems  Constitutional:  Positive for diaphoresis and malaise/fatigue. Negative for chills, fever and weight loss.  HENT:  Negative for congestion, ear discharge, ear pain, hearing loss, nosebleeds, sinus pain, sore throat  and tinnitus.   Eyes: Negative.   Respiratory:  Positive for shortness of breath. Negative for cough, hemoptysis, sputum production, wheezing and stridor.   Cardiovascular:  Positive for chest pain and palpitations. Negative for orthopnea, claudication, leg swelling and PND.  Gastrointestinal:  Positive for abdominal pain, heartburn, nausea and vomiting. Negative for blood in stool, constipation, diarrhea and melena.       + diverticulitis  Genitourinary:  Positive for dysuria, frequency, hematuria and urgency. Negative for flank pain.       + kidney stones  Musculoskeletal:  Positive for back pain, joint pain and myalgias. Negative for falls and neck pain.  Skin:  Negative for itching and rash.       Skin "bumps" over thyroid and left shoulder  Neurological:  Positive for dizziness, tingling, loss of  consciousness and weakness. Negative for focal weakness and headaches.       Uncertain if event involved a siezure  Endo/Heme/Allergies:  Negative for environmental allergies and polydipsia. Does not bruise/bleed easily.  Psychiatric/Behavioral:  Positive for memory loss. Negative for depression, hallucinations, substance abuse and suicidal ideas. The patient has insomnia. The patient is not nervous/anxious.     Physical Exam: BP 117/77 (BP Location: Left Arm)   Pulse (!) 49   Temp 97.7 F (36.5 C) (Oral)   Resp 12   Ht '5\' 8"'$  (1.727 m)   Wt 73 kg   SpO2 95%   BMI 24.46 kg/m    General appearance: alert, cooperative, and no distress Head: Normocephalic, without obvious abnormality, atraumatic Neck: no adenopathy, no carotid bruit, no JVD, supple, symmetrical, trachea midline, and thyroid not enlarged, symmetric, no tenderness/mass/nodules Lymph nodes: Cervical, supraclavicular, and axillary nodes normal. Resp: clear to auscultation bilaterally Back: symmetric, no curvature. ROM normal. No CVA tenderness. Cardio: regular rate and rhythm, S1, S2 normal, no murmur, click, rub or gallop GI: soft, non-tender; bowel sounds normal; no masses,  no organomegaly Extremities: extremities normal, atraumatic, no cyanosis or edema Neurologic: Grossly normal  Diagnostic Studies & Laboratory data:     Recent Radiology Findings:   CARDIAC CATHETERIZATION  Result Date: 04/09/2022   Mid LAD lesion is 100% stenosed.   1st Diag lesion is 90% stenosed.   1st Mrg lesion is 50% stenosed.   Mid Cx lesion is 90% stenosed.   2nd Mrg lesion is 90% stenosed.   Ost RCA to Mid RCA lesion is 95% stenosed.   Mid RCA to Dist RCA lesion is 30% stenosed.   There is mild left ventricular systolic dysfunction.   LV end diastolic pressure is normal.   The left ventricular ejection fraction is 50-55% by visual estimate. Severe 3 vessel obstructive CAD Mildly reduced LV function. Inferoapical HK Normal LVEDP Plan: Needs CT  surgery consult for CABG.   CT RENAL STONE STUDY  Result Date: 04/08/2022 CLINICAL DATA:  Flank pain.  Kidney stone suspected. EXAM: CT ABDOMEN AND PELVIS WITHOUT CONTRAST TECHNIQUE: Multidetector CT imaging of the abdomen and pelvis was performed following the standard protocol without IV contrast. RADIATION DOSE REDUCTION: This exam was performed according to the departmental dose-optimization program which includes automated exposure control, adjustment of the mA and/or kV according to patient size and/or use of iterative reconstruction technique. COMPARISON:  CT a chest, abdomen and pelvis dated 04/06/2022. FINDINGS: Lower chest: No acute abnormality. Hepatobiliary: No focal liver abnormality is seen. No gallstones, gallbladder wall thickening, or biliary dilatation. Pancreas: Unremarkable. No pancreatic ductal dilatation or surrounding inflammatory changes. Spleen: Normal in size without  focal abnormality. Adrenals/Urinary Tract: No adrenal masses. Kidneys are normal in size, orientation and position. No renal masses, stones or hydronephrosis. Ureters are normal in course and in caliber. No ureteral stones. Normal bladder. Stomach/Bowel: Stomach moderately distended, otherwise unremarkable. Small bowel and colon are normal in caliber. No wall thickening. No inflammation. Multiple colonic diverticula. Normal appendix. Vascular/Lymphatic: Aortic atherosclerosis. No aneurysm. No enlarged lymph nodes. Reproductive: Unremarkable. Other: No abdominal wall hernia or abnormality. No abdominopelvic ascites. Musculoskeletal: No fracture or acute finding.  No bone lesion. IMPRESSION: 1. No acute findings within the abdomen or pelvis. No renal or ureteral stones or obstructive uropathy. No findings to account for flank pain. 2. Colonic diverticula without evidence of diverticulitis. 3. Aortic atherosclerosis. Electronically Signed   By: Lajean Manes M.D.   On: 04/08/2022 16:11     I have independently reviewed the  above radiologic studies and discussed with the patient   Recent Lab Findings: Lab Results  Component Value Date   WBC 7.0 04/09/2022   HGB 13.7 04/09/2022   HCT 38.3 (L) 04/09/2022   PLT 170 04/09/2022   GLUCOSE 99 04/09/2022   CHOL 126 03/27/2022   TRIG 59 03/27/2022   HDL 45 03/27/2022   LDLDIRECT 190.7 03/24/2013   LDLCALC 68 03/27/2022   ALT 37 04/06/2022   AST 31 04/06/2022   NA 135 04/09/2022   K 4.2 04/09/2022   CL 105 04/09/2022   CREATININE 0.89 04/09/2022   BUN 10 04/09/2022   CO2 22 04/09/2022       ECHOCARDIOGRAM REPORT         Patient Name:   Austin Vincent [facility] Date of Exam: 04/07/2022  Medical Rec #:  [id number]           Height:       68.0 in  Accession #:    [id number]          Weight:       165.2 lb  Date of Birth:  [birth year]            BSA:          1.884 m  Patient Age:    4 years            BP:           102/59 mmHg  Patient Gender: M                   HR:           58 bpm.  Exam Location:  Inpatient   Procedure: 2D Echo, Cardiac Doppler and Color Doppler   Indications:    Syncope     History:        Patient has prior history of Echocardiogram examinations,  most                  recent 12/01/2013. CAD and Previous Myocardial Infarction,                  Signs/Symptoms:Syncope; Risk Factors:Dyslipidemia,  Hypertension                  and Current Smoker.     Sonographer:    Wenda Low  Referring Phys: [id number] [place]     1. Can consider f/u contrast imaging to look at apex if clinically  indicated.   2. Distal septal / apical hypokinesis Apex with calcified band/chords  doubt thrombus. Left ventricular ejection fraction, by estimation, is  50  to 55%. The left ventricle has low normal function. The left ventricle  demonstrates regional wall motion  abnormalities (see scoring diagram/findings for description). Left  ventricular diastolic parameters were normal.   3. Right ventricular systolic function is  normal. The right ventricular  size is normal.   4. Left atrial size was mildly dilated.   5. The mitral valve is abnormal. Trivial mitral valve regurgitation. No  evidence of mitral stenosis.   6. The aortic valve is tricuspid. There is mild calcification of the  aortic valve. Aortic valve regurgitation is not visualized. No aortic  stenosis is present.   7. The inferior vena cava is normal in size with greater than 50%  respiratory variability, suggesting right atrial pressure of 3 mmHg.   FINDINGS   Left Ventricle: Distal septal / apical hypokinesis Apex with calcified  band/chords doubt thrombus. Left ventricular ejection fraction, by  estimation, is 50 to 55%. The left ventricle has low normal function. The  left ventricle demonstrates regional wall   motion abnormalities. The left ventricular internal cavity size was  normal in size. There is no left ventricular hypertrophy. Left ventricular  diastolic parameters were normal.   Right Ventricle: The right ventricular size is normal. No increase in  right ventricular wall thickness. Right ventricular systolic function is  normal.   Left Atrium: Left atrial size was mildly dilated.   Right Atrium: Right atrial size was normal in size.   Pericardium: There is no evidence of pericardial effusion.   Mitral Valve: The mitral valve is abnormal. There is mild thickening of  the mitral valve leaflet(s). Trivial mitral valve regurgitation. No  evidence of mitral valve stenosis. MV peak gradient, 5.4 mmHg. The mean  mitral valve gradient is 2.0 mmHg.   Tricuspid Valve: The tricuspid valve is normal in structure. Tricuspid  valve regurgitation is trivial. No evidence of tricuspid stenosis.   Aortic Valve: The aortic valve is tricuspid. There is mild calcification  of the aortic valve. Aortic valve regurgitation is not visualized. No  aortic stenosis is present. Aortic valve mean gradient measures 4.0 mmHg.  Aortic valve peak  gradient measures  9.0 mmHg. Aortic valve area, by VTI measures 1.88 cm.   Pulmonic Valve: The pulmonic valve was normal in structure. Pulmonic valve  regurgitation is trivial. No evidence of pulmonic stenosis.   Aorta: The aortic root is normal in size and structure.   Venous: The inferior vena cava is normal in size with greater than 50%  respiratory variability, suggesting right atrial pressure of 3 mmHg.   IAS/Shunts: No atrial level shunt detected by color flow Doppler.   Additional Comments: Can consider f/u contrast imaging to look at apex if  clinically indicated.      LEFT VENTRICLE  PLAX 2D  LVIDd:         4.90 cm     Diastology  LVIDs:         3.50 cm     LV e' medial:    7.51 cm/s  LV PW:         1.00 cm     LV E/e' medial:  14.1  LV IVS:        1.10 cm     LV e' lateral:   12.90 cm/s  LVOT diam:     1.90 cm     LV E/e' lateral: 8.2  LV SV:         66  LV SV Index:   35  LVOT Area:     2.84 cm     LV Volumes (MOD)  LV vol d, MOD A2C: 76.2 ml  LV vol d, MOD A4C: 74.8 ml  LV vol s, MOD A2C: 31.6 ml  LV vol s, MOD A4C: 34.8 ml  LV SV MOD A2C:     44.6 ml  LV SV MOD A4C:     74.8 ml  LV SV MOD BP:      43.0 ml   RIGHT VENTRICLE  RV Basal diam:  3.25 cm  RV Mid diam:    2.80 cm  RV S prime:     12.80 cm/s  TAPSE (M-mode): 2.4 cm   LEFT ATRIUM             Index        RIGHT ATRIUM           Index  LA diam:        4.10 cm 2.18 cm/m   RA Area:     17.30 cm  LA Vol (A2C):   50.5 ml 26.80 ml/m  RA Volume:   43.10 ml  22.87 ml/m  LA Vol (A4C):   59.3 ml 31.47 ml/m  LA Biplane Vol: 57.8 ml 30.68 ml/m   AORTIC VALVE                    PULMONIC VALVE  AV Area (Vmax):    1.97 cm     PV Vmax:       0.90 m/s  AV Area (Vmean):   1.87 cm     PV Peak grad:  3.2 mmHg  AV Area (VTI):     1.88 cm  AV Vmax:           150.00 cm/s  AV Vmean:          95.700 cm/s  AV VTI:            0.352 m  AV Peak Grad:      9.0 mmHg  AV Mean Grad:      4.0 mmHg  LVOT Vmax:          104.00 cm/s  LVOT Vmean:        63.100 cm/s  LVOT VTI:          0.234 m  LVOT/AV VTI ratio: 0.66     AORTA  Ao Root diam: 3.50 cm  Ao Asc diam:  3.00 cm   MITRAL VALVE  MV Area (PHT): 2.95 cm     SHUNTS  MV Area VTI:   1.48 cm     Systemic VTI:  0.23 m  MV Peak grad:  5.4 mmHg     Systemic Diam: 1.90 cm  MV Mean grad:  2.0 mmHg  MV Vmax:       1.16 m/s  MV Vmean:      58.4 cm/s  MV Decel Time: 257 msec  MV E velocity: 106.00 cm/s  MV A velocity: 82.70 cm/s  MV E/A ratio:  1.28   Austin Rouge MD  Electronically signed by Austin Rouge MD  Signature Date/Time: 04/07/2022/10:52:24 AM       Assessment / Plan: Severe three-vessel coronary artery disease In the setting of non-ST segment elevation myocardial infarction.  Multiple previous PCI Episode of syncope unclear etiology Medullary thyroid cancer with planned future resection Hypertension Hyperlipidemia Former tobacco abuse Emphysema Diverticulosis Pulmonary nodules  Plan: CABG    I  spent 30 minutes counseling the patient face  to face.   John Giovanni, PA-C  04/09/2022 10:45 AM  Patient examined and images of cardiac catheterization and echocardiogram personally reviewed and counseled with patient.  51 year old reformed smoker with long history of coronary disease status post PCI of an occluded RCA several years ago.  At that time he also had a chronically occluded LAD with collateralization.  He returns now with recurrent angina.  His coronary angiograms show reocclusion of the RCA, chronic occlusion of LAD, high-grade stenosis of the circumflex marginal and a high-grade stenosis of a large diagonal.  His ejection fraction is mild to moderately reduced without valvular disease.  He would benefit from surgical revascularization at this time with grafts to the posterior descending, circumflex marginal, diagonal, and possibly the LAD. The patient was recently diagnosed with medullary thyroid cancer documented with  biopsy.  He will need thyroidectomy after recovery from his coronary surgery. Right upper lobe pneumonia was also recently diagnosed and he is currently on Unasyn antibiotics.  Follow-up chest x-ray is pending tomorrow.  We will plan surgery later this week after he has the right upper lobe infection well-controlled.  He agrees to the plan with surgery.  [name] MD

## 2022-04-09 NOTE — Progress Notes (Signed)
04/09/22 1300  Clinical Encounter Type  Visited With Patient and family together (Significant Other: Tour manager)  Visit Type Initial Programmer, systems)  Referral From Nurse (India Hook. Alroy Dust, RN)  Consult/Referral To Chaplain Melvenia Beam)  Recommendations Advance Directive Education   Chaplain responded to spiritual care consult request for Advance Directive education with Mr. Lynkin Saini. [place]. Met with Mr. Berkovich and his significant other, Joann Cowett at patient's bedside.  Mr. Mendonca stated his does have one adult child, but did not convey who he intended to be his agent at this time.    Chaplain provided the Advance Directive packet as well as education on Advance Directives-documents an individual completes to communicate their health care directions in advance of a time when they may need them. Chaplain informed Mr. Borgen the documents which may be completed here in the hospital are the Living Will and [place].   Chaplain informed patient that the [place] is a legal document in which an individual names another person, as their [place], to make health care decisions when the individual is not able to make them for themselves. The Health Care Agent's function can be temporary or permanent depending on his ability to make and communicate those decisions independently.   Chaplain informed Mr. Jares in the absence of a [place], the state of [place] directs health care providers to look to the following individuals in the order listed: legal guardian; an attorney-in-fact under a general power of attorney (POA) if that POA includes the right to make health care decisions; person's spouse; a 45 of her children; a 19 of adult brothers and sisters; or an individual who has an established relationship with you, who is acting in good faith and who can convey  your wishes.  If none of these persons are available or willing to make medical decisions on a patient's behalf, the law allows the patient's doctor to make decisions for them as long as another doctor agrees with those decisions.  Chaplain also informed the patient that the Health Care agent has no decision-making authority over any affairs other than those related to his medical care.   The chaplain further educated Mr. Pacella that a Living Will is a legal document that allows his desires not to receive life-prolonging measures in the event that they have a condition that is incurable and will result in his death in a short period of time; they are unconscious, and doctors are confident that they will not regain consciousness; and/or they have advanced dementia or other substantial and irreversible loss of mental function.   The chaplain informed Mr. Tesar that life-prolonging measures are medical treatments that would only serve to postpone death, including breathing machines, kidney dialysis, antibiotics, artificial nutrition and hydration (tube feeding), and similar forms of treatment and that if an individual is able to express their wishes, they may also make them known without the use of a Living Will, but in the event that he is not able to express his wishes, a Living Will allows medical providers and the his family and friends to ensure that they are not making decisions on the his behalf, but rather serving as the his voice to convey the decisions that he has already made.   Mr. Sperl is aware that the decision to create an advance directive is his alone and he may choose not to complete the documents or may choose to  complete one portion or both.  Mr. Doescher was informed that he can revoke the documents at any time by striking through them and writing void or by completing new documents, but that it is also advisable that the individual verbally notify interested parties that  their wishes have changed.   Mr. Raper is also aware that the document must be signed in the presence of a notary public and two witnesses and that this can be done while the patient is still admitted to the hospital or after discharge in the community. If they decide to complete Advance Directives after being discharged from the hospital, they have been advised to notify all interested parties and to provide those documents to their physicians and loved ones in addition to bringing them to the hospital in the event of another hospitalization.   The chaplain informed the Mr. Rathe that if he desires to proceed with completing Advance Directive Documentation while he is still admitted, notary services are typically available at [facility] between the hours of 1:00 and 3:30 Monday-Thursday.   When the patient is ready to have these documents completed, the patient should request that their nurse place a spiritual care consult and indicate that the patient is ready to have their advance directives notarized so that arrangements for witnesses and notary public can be made.   Please page spiritual care if the patient desires further education or has questions.   [address] [place], [place]. [place]., [phone].

## 2022-04-10 ENCOUNTER — Inpatient Hospital Stay (HOSPITAL_COMMUNITY): Payer: BC Managed Care – PPO

## 2022-04-10 DIAGNOSIS — Z01810 Encounter for preprocedural cardiovascular examination: Secondary | ICD-10-CM

## 2022-04-10 DIAGNOSIS — R778 Other specified abnormalities of plasma proteins: Secondary | ICD-10-CM

## 2022-04-10 DIAGNOSIS — I2510 Atherosclerotic heart disease of native coronary artery without angina pectoris: Secondary | ICD-10-CM | POA: Diagnosis not present

## 2022-04-10 DIAGNOSIS — I249 Acute ischemic heart disease, unspecified: Secondary | ICD-10-CM

## 2022-04-10 DIAGNOSIS — R9431 Abnormal electrocardiogram [ECG] [EKG]: Secondary | ICD-10-CM

## 2022-04-10 DIAGNOSIS — I7090 Unspecified atherosclerosis: Secondary | ICD-10-CM | POA: Diagnosis not present

## 2022-04-10 DIAGNOSIS — R55 Syncope and collapse: Secondary | ICD-10-CM | POA: Diagnosis not present

## 2022-04-10 DIAGNOSIS — I214 Non-ST elevation (NSTEMI) myocardial infarction: Secondary | ICD-10-CM | POA: Diagnosis not present

## 2022-04-10 LAB — CBC WITH DIFFERENTIAL/PLATELET
Abs Immature Granulocytes: 0.02 10*3/uL (ref 0.00–0.07)
Basophils Absolute: 0 10*3/uL (ref 0.0–0.1)
Basophils Relative: 0 %
Eosinophils Absolute: 0.4 10*3/uL (ref 0.0–0.5)
Eosinophils Relative: 7 %
HCT: 39.3 % (ref 39.0–52.0)
Hemoglobin: 14.2 g/dL (ref 13.0–17.0)
Immature Granulocytes: 0 %
Lymphocytes Relative: 30 %
Lymphs Abs: 2 10*3/uL (ref 0.7–4.0)
MCH: 30.9 pg (ref 26.0–34.0)
MCHC: 36.1 g/dL — ABNORMAL HIGH (ref 30.0–36.0)
MCV: 85.4 fL (ref 80.0–100.0)
Monocytes Absolute: 0.5 10*3/uL (ref 0.1–1.0)
Monocytes Relative: 8 %
Neutro Abs: 3.6 10*3/uL (ref 1.7–7.7)
Neutrophils Relative %: 55 %
Platelets: 172 10*3/uL (ref 150–400)
RBC: 4.6 MIL/uL (ref 4.22–5.81)
RDW: 12.3 % (ref 11.5–15.5)
WBC: 6.5 10*3/uL (ref 4.0–10.5)
nRBC: 0 % (ref 0.0–0.2)

## 2022-04-10 LAB — COMPREHENSIVE METABOLIC PANEL
ALT: 112 U/L — ABNORMAL HIGH (ref 0–44)
AST: 100 U/L — ABNORMAL HIGH (ref 15–41)
Albumin: 3.7 g/dL (ref 3.5–5.0)
Alkaline Phosphatase: 55 U/L (ref 38–126)
Anion gap: 4 — ABNORMAL LOW (ref 5–15)
BUN: 9 mg/dL (ref 6–20)
CO2: 26 mmol/L (ref 22–32)
Calcium: 8.9 mg/dL (ref 8.9–10.3)
Chloride: 107 mmol/L (ref 98–111)
Creatinine, Ser: 0.96 mg/dL (ref 0.61–1.24)
GFR, Estimated: >60 mL/min (ref >60)
Glucose, Bld: 108 mg/dL — ABNORMAL HIGH (ref 70–99)
Potassium: 4 mmol/L (ref 3.5–5.1)
Sodium: 137 mmol/L (ref 135–145)
Total Bilirubin: 0.6 mg/dL (ref 0.3–1.2)
Total Protein: 6.4 g/dL — ABNORMAL LOW (ref 6.5–8.1)

## 2022-04-10 LAB — URINALYSIS, ROUTINE W REFLEX MICROSCOPIC
Bilirubin Urine: NEGATIVE
Glucose, UA: NEGATIVE mg/dL
Hgb urine dipstick: NEGATIVE
Ketones, ur: NEGATIVE mg/dL
Leukocytes,Ua: NEGATIVE
Nitrite: NEGATIVE
Protein, ur: NEGATIVE mg/dL
Specific Gravity, Urine: 1.013 (ref 1.005–1.030)
pH: 7 (ref 5.0–8.0)

## 2022-04-10 LAB — TSH: TSH: 0.981 u[IU]/mL (ref 0.350–4.500)

## 2022-04-10 LAB — HEPARIN LEVEL (UNFRACTIONATED)
Heparin Unfractionated: 0.22 IU/mL — ABNORMAL LOW (ref 0.30–0.70)
Heparin Unfractionated: 0.21 IU/mL — ABNORMAL LOW (ref 0.30–0.70)
Heparin Unfractionated: 0.71 IU/mL — ABNORMAL HIGH (ref 0.30–0.70)

## 2022-04-10 LAB — HEMOGLOBIN A1C
Hgb A1c MFr Bld: 5.5 % (ref 4.8–5.6)
Mean Plasma Glucose: 111.15 mg/dL

## 2022-04-10 LAB — MAGNESIUM: Magnesium: 2.1 mg/dL (ref 1.7–2.4)

## 2022-04-10 MED ORDER — FLUTICASONE FUROATE-VILANTEROL 200-25 MCG/ACT IN AEPB
1.0000 | INHALATION_SPRAY | Freq: Every day | RESPIRATORY_TRACT | Status: DC
  Administered 2022-04-11: 1 via RESPIRATORY_TRACT
  Filled 2022-04-10 (×2): qty 28

## 2022-04-10 MED ORDER — GUAIFENESIN ER 600 MG PO TB12
600.0000 mg | ORAL_TABLET | Freq: Two times a day (BID) | ORAL | Status: DC
  Administered 2022-04-10 – 2022-04-17 (×12): 600 mg via ORAL
  Filled 2022-04-10 (×12): qty 1

## 2022-04-10 NOTE — Progress Notes (Signed)
1 Day Post-Op Procedure(s) (LRB): LEFT HEART CATH AND CORONARY ANGIOGRAPHY (N/A) Subjective: No complaints PFTs adequate - 80% DCLO Pre cabg dopplers satisfactory Todays CXR with improvement RUL infiltrate On Unasyn for poss R aspiration pneumonia  Objective: Vital signs in last 24 hours: Temp:  [97.5 F (36.4 C)-98 F (36.7 C)] 98 F (36.7 C) (05/31 0858) Pulse Rate:  [52-54] 52 (05/31 0408) Cardiac Rhythm: Sinus bradycardia (05/30 1900) Resp:  [15] 15 (05/30 2025) BP: (127-134)/(69-86) 127/69 (05/31 0408) SpO2:  [98 %] 98 % (05/31 0408) Weight:  [73 kg] 73 kg (05/31 0408)  Hemodynamic parameters for last 24 hours:  nsr  Intake/Output from previous day: 05/30 0701 - 05/31 0700 In: 1283 [P.O.:954; I.V.:129; IV Piggyback:200] Out: 1250 [Urine:1250] Intake/Output this shift: Total I/O In: 199.5 [I.V.:87.4; IV Piggyback:112] Out: 1525 [Urine:1525]  EXAM Lungs clear No murmrur  Lab Results: Recent Labs    04/09/22 0332 04/10/22 0319  WBC 7.0 6.5  HGB 13.7 14.2  HCT 38.3* 39.3  PLT 170 172   BMET:  Recent Labs    04/09/22 0332 04/10/22 0319  NA 135 137  K 4.2 4.0  CL 105 107  CO2 22 26  GLUCOSE 99 108*  BUN 10 9  CREATININE 0.89 0.96  CALCIUM 8.9 8.9    PT/INR: No results for input(s): LABPROT, INR in the last 72 hours. ABG No results found for: PHART, HCO3, TCO2, ACIDBASEDEF, O2SAT CBG (last 3)  No results for input(s): GLUCAP in the last 72 hours.  Assessment/Plan: S/P Procedure(s) (LRB): LEFT HEART CATH AND CORONARY ANGIOGRAPHY (N/A) Plan CABG Fri using L radial artery   LOS: 3 days    [name] 04/10/2022

## 2022-04-10 NOTE — Progress Notes (Addendum)
ANTICOAGULATION CONSULT NOTE - Follow Up Consult  Pharmacy Consult for heparin Indication:  CAD awaiting CABG  Labs: Recent Labs    04/07/22 0314 04/07/22 0949 04/07/22 1122 04/07/22 1522 04/07/22 1740 04/07/22 2050 04/08/22 0139 04/09/22 0332 04/09/22 2322  HGB 12.8*  --   --   --   --   --  12.8* 13.7  --   HCT 35.9*  --   --   --   --   --  36.4* 38.3*  --   PLT 169  --   --   --   --   --  162 170  --   HEPARINUNFRC 0.20*  --    < >  --    < >  --  0.53 0.43 0.22*  CREATININE 0.84  --   --   --   --   --  0.85 0.89  --   TROPONINIHS 2,302* 1,579*  --  1,148*  --  924*  --   --   --    < > = values in this interval not displayed.    Assessment/Plan:  51yo male subtherapeutic on heparin after resumed post-cath but was previously therapeutic at this rate and likely needs more time to accumulate. Will continue infusion at current rate of 1000 units/hr and check level with am labs.    Wynona Neat, PharmD, BCPS  04/10/2022,1:12 AM  Addendum: Heparin level with am labs 0.21, remains below goal. Will increase heparin gtt by 2 units/kg/hr to 1150 units/hr and check level in 6 hours. VB 5:20 AM

## 2022-04-10 NOTE — Progress Notes (Signed)
[facility]                                                                              Hawk Mones, is a 51 y.o. male, DOB - 22-Apr-1971, NID:[id number] Admit date - 04/06/2022    Outpatient Primary MD for the patient is Rudd, Lillette Boxer, MD  LOS - 3  days  Chief Complaint  Patient presents with   Loss of Consciousness       Brief summary   Patient is a 51 yo male with newly diagnosed medullary thyroid carcinoma, CAD, HTN, HLD, tobacco use who presented after a syncopal episode in the car. He was already stopped when having the episode and noted associated diaphoresis as well. He regained consciousness when a bystander knocked on his window as well.  He was brought to the ER via EMS for further work-up and evaluation. Of note, he also has been monitored for nephrolithiasis and may have also passed a stone approximately 2 days prior to admission as he had associated groin pain, bloody urine output and probable passage of a stone.  Urine has since cleared after this.   On work-up, EKG showed new T wave inversions, and troponins trended up.  He had no chest pain during work-up.  He was started on a heparin drip and cardiology was consulted.   CT angio chest/abdomen/pelvis was negative for aortic aneurysm or dissection.  Coronary artery calcifications were noted.  There was also clustered nodular consolidation involving right upper lobe with surrounding groundglass nodules measuring up to 12 mm. Due to some concern for possible aspiration he was also started on Unasyn.  Procalcitonin was also elevated. He underwent heart cath on 04/09/2022 which showed severe three-vessel CAD. Cardiothoracic surgery consulted and planning CABG next week   Assessment & Plan    Principal Problem:   Syncope -Unclear etiology, no seizure like activity. - 2D echo showed normal EF.  No arrhythmias on telemetry. -Cardiology, cardiothoracic surgery following  Active Problems:   Non-ST  elevation (NSTEMI) myocardial infarction ([place]) -Noted to have T WI on EKG with uptrending troponins.  Patient was placed on heparin drip and cardiology was consulted. -2D echo showed normal EF 50 to 14%, normal diastolic function, distal septal/apical hypokinesis apex with calcified band/cords.  No LVH, AS/MS -Cardiac cath on 04/09/2022 showed severe three-vessel CAD.   -CTVS consulted, plan for CABG this week  Ground glass opacity present on imaging of lung -Possibly aspirated, leukocytosis noted patient, elevated procalcitonin,  -continue Unasyn empirically for aspiration coverage.     Pulmonary nodules - Clustered nodular consolidation noted in right upper lobe with surrounding groundglass opacities noted on CT on admission.  Nodules are new compared to CT performed 01/23/2022.  Nodules measure up to 12 mm per read -Given underlying new medullary thyroid carcinoma, this may need further evaluation but currently NSTEMI taking precedence -Patient and wife are aware of findings and will plan for outpatient follow-up  Nephrolithiasis - History of nephrolithiasis in past requiring lithotripsy.  Appear to be symptomatic in the past 3 to 4 weeks and may have passed the stone 2 days prior to admission.  Pain, hematuria  has resolved. -Patient began developing some abdominal discomfort concerning for recurrent stone on 04/08/2022.  Repeat CT renal stone protocol performed which was negative for nephrolithiasis nor any obstructing stones   Medullary thyroid carcinoma ([place]) - Diagnosed on FNA on 03/07/2022 - Patient following with ENT and endocrinology.  Currently no date set for thyroidectomy; seen by Dr. Constance Holster with ENT on 03/21/22   Essential hypertension -BP stable, continue Toprol, amlodipine.     Hyperlipidemia -Continue Lipitor 80 mg daily, Zetia  Recent LDL <70   Atherosclerotic vascular disease - per CT: "Extensive abdominopelvic aortic atherosclerotic disease with branch vessel  involvement. Areas of noncritical stenosis are noted involving the superior mesenteric artery, inferior mesenteric artery, and right renal artery." - Continue high intensity statin, ASA      Code Status: Full CODE STATUS DVT Prophylaxis:     Level of Care: Level of care: Telemetry Cardiac Family Communication: Updated patient's significant other at the bedside   Disposition Plan:      Remains inpatient appropriate: Plan for CABG this week   Procedures:  2D echo Cardiac cath  Consultants:   Cardiology Cardiothoracic surgery  Antimicrobials:   Anti-infectives (From admission, onward)    Start     Dose/Rate Route Frequency Ordered Stop   04/06/22 1730  ampicillin-sulbactam (UNASYN) 1.5 g in sodium chloride 0.9 % 100 mL IVPB        1.5 g 200 mL/hr over 30 Minutes Intravenous Every 6 hours 04/06/22 1725            Medications  amLODipine  2.5 mg Oral Daily   aspirin EC  81 mg Oral Daily   atorvastatin  80 mg Oral Daily   ezetimibe  10 mg Oral Daily   melatonin  3 mg Oral Once   sodium chloride flush  3 mL Intravenous Q12H   sodium chloride flush  3 mL Intravenous Q12H   sodium chloride flush  3 mL Intravenous Q12H      Subjective:   Cheyenne Alanis was seen and examined today.  No acute complaints, questions about the timing of CABG this week.  Patient denies dizziness, chest pain, shortness of breath, abdominal pain, N/V/D/C, new weakness, numbess, tingling. No acute events overnight.    Objective:   Vitals:   04/09/22 1130 04/09/22 2025 04/10/22 0408 04/10/22 0858  BP: 134/82 134/86 127/69   Pulse: (!) 56 (!) 54 (!) 52   Resp:  15    Temp:  (!) 97.5 F (36.4 C) 97.7 F (36.5 C) 98 F (36.7 C)  TempSrc:  Oral Oral Oral  SpO2:  98% 98%   Weight:   73 kg   Height:        Intake/Output Summary (Last 24 hours) at 04/10/2022 1323 Last data filed at 04/10/2022 0617 Gross per 24 hour  Intake 806 ml  Output 650 ml  Net 156 ml     Wt Readings from  Last 3 Encounters:  04/10/22 73 kg  04/03/22 74.3 kg  03/29/22 73.9 kg     Exam General: Alert and oriented x 3, NAD Cardiovascular: S1 S2 auscultated,  RRR Respiratory: Clear to auscultation bilaterally, no wheezing, rales Gastrointestinal: Soft, nontender, nondistended, + bowel sounds Ext: no pedal edema bilaterally Neuro: no new deficits Psych: Normal affect and demeanor, alert and oriented x3     Data Reviewed:  I have personally reviewed following labs    CBC Lab Results  Component Value Date   WBC 6.5 04/10/2022   RBC 4.60  04/10/2022   HGB 14.2 04/10/2022   HCT 39.3 04/10/2022   MCV 85.4 04/10/2022   MCH 30.9 04/10/2022   PLT 172 04/10/2022   MCHC 36.1 (H) 04/10/2022   RDW 12.3 04/10/2022   LYMPHSABS 2.0 04/10/2022   MONOABS 0.5 04/10/2022   EOSABS 0.4 04/10/2022   BASOSABS 0.0 93/71/6967     Last metabolic panel Lab Results  Component Value Date   NA 137 04/10/2022   K 4.0 04/10/2022   CL 107 04/10/2022   CO2 26 04/10/2022   BUN 9 04/10/2022   CREATININE 0.96 04/10/2022   GLUCOSE 108 (H) 04/10/2022   GFRNONAA >60 04/10/2022   GFRAA 109 10/31/2020   CALCIUM 8.9 04/10/2022   PROT 6.4 (L) 04/10/2022   ALBUMIN 3.7 04/10/2022   BILITOT 0.6 04/10/2022   ALKPHOS 55 04/10/2022   AST 100 (H) 04/10/2022   ALT 112 (H) 04/10/2022   ANIONGAP 4 (L) 04/10/2022       Radiology Studies: I have personally reviewed the imaging studies  DG Chest 2 View  Result Date: 04/10/2022 CLINICAL DATA:  Chest pain. EXAM: CHEST - 2 VIEW COMPARISON:  CTA chest, abdomen and pelvis 04/06/2022. FINDINGS: Irregular opacities previously seen in the right upper lobe appear to be less dense today consistent with a partially resolved pneumonic infiltrate most likely. The remaining lungs are generally clear. No pleural effusion is seen. There is no new or worsened opacity. The cardiac size is normal. Unremarkable mediastinal configuration. Thoracic cage is intact. IMPRESSION:  Irregular right upper lobe opacities in the lateral apical area are less dense today most likely due to partial resolution of an infectious process. No new abnormality. Electronically Signed   By: Telford Nab M.D.   On: 04/10/2022 06:32   CARDIAC CATHETERIZATION  Result Date: 04/09/2022   Mid LAD lesion is 100% stenosed.   1st Diag lesion is 90% stenosed.   1st Mrg lesion is 50% stenosed.   Mid Cx lesion is 90% stenosed.   2nd Mrg lesion is 90% stenosed.   Ost RCA to Mid RCA lesion is 95% stenosed.   Mid RCA to Dist RCA lesion is 30% stenosed.   There is mild left ventricular systolic dysfunction.   LV end diastolic pressure is normal.   The left ventricular ejection fraction is 50-55% by visual estimate. Severe 3 vessel obstructive CAD Mildly reduced LV function. Inferoapical HK Normal LVEDP Plan: Needs CT surgery consult for CABG.   CT RENAL STONE STUDY  Result Date: 04/08/2022 CLINICAL DATA:  Flank pain.  Kidney stone suspected. IMPRESSION: 1. No acute findings within the abdomen or pelvis. No renal or ureteral stones or obstructive uropathy. No findings to account for flank pain. 2. Colonic diverticula without evidence of diverticulitis. 3. Aortic atherosclerosis. Electronically Signed   By: Lajean Manes M.D.   On: 04/08/2022 16:11   VAS US DOPPLER PRE CABG  Result Date: 04/10/2022 PREOPERATIVE VASCULAR EVALUATION Patient Name:  AMEEN MOSTAFA [facility]  Date of Exam:   04/10/2022 Medical Rec #:  Summary: Right Carotid: The extracranial vessels were near-normal with only minimal wall                thickening or plaque. Left Carotid: The extracranial vessels were near-normal with only minimal wall               thickening or plaque. Vertebrals:  Bilateral vertebral arteries demonstrate antegrade flow. Subclavians: Normal flow hemodynamics were seen in bilateral subclavian  arteries. Bilateral ABI: Bilateral pedal waveforms are within normal range at rest. Right Upper Extremity: Doppler  waveforms remain within normal limits with right radial compression. Doppler waveforms remain within normal limits with right ulnar compression. Left Upper Extremity: Doppler waveforms remain within normal limits with left radial compression. Doppler waveforms remain within normal limits with left ulnar compression.    Preliminary        Estill Cotta M.D. [facility] 04/10/2022, 1:23 PM  Available via Epic secure chat 7am-7pm After 7 pm, please refer to night coverage provider listed on amion.

## 2022-04-10 NOTE — Progress Notes (Signed)
[place] for heparin Indication: chest pain/ACS  No Known Allergies  Patient Measurements: Height: '5\' 8"'$  (172.7 cm) Weight: 73 kg (160 lb 14.4 oz) IBW/kg (Calculated) : 68.4 Heparin Dosing Weight: 73.5 kg  Vital Signs: Temp: 97.7 F (36.5 C) (05/31 0408) Temp Source: Oral (05/31 0408) BP: 127/69 (05/31 0408) Pulse Rate: 52 (05/31 0408)  Labs: Recent Labs    04/07/22 0949 04/07/22 1122 04/07/22 1522 04/07/22 1740 04/07/22 2050 04/08/22 0139 04/09/22 0332 04/09/22 2322 04/10/22 0319  HGB  --   --   --    < >  --  12.8* 13.7  --  14.2  HCT  --   --   --   --   --  36.4* 38.3*  --  39.3  PLT  --   --   --   --   --  162 170  --  172  HEPARINUNFRC  --    < >  --    < >  --  0.53 0.43 0.22* 0.21*  CREATININE  --   --   --   --   --  0.85 0.89  --  0.96  TROPONINIHS 1,579*  --  1,148*  --  924*  --   --   --   --    < > = values in this interval not displayed.     Estimated Creatinine Clearance: 86.1 mL/min (by C-G formula based on SCr of 0.96 mg/dL).   Medical History: Past Medical History:  Diagnosis Date   CAD (coronary artery disease)    a. s/p MI @ age 7 with PCI of the RCA;  b. CTO of the LAD;  c. 11/2013 NSTEMI/PCI: LM nl, LAD 100 L->L collats, LCX 50p, OM2 80p, OM1 60ost, RCA 100p (3.5x38 Promus DES), RPL 99/42mPTCA only, EF 40-50%;  c. 11/2013 Echo: EF 50-55%, Gr1 DD, mild MR.   HYPERLIPIDEMIA    HYPERTENSION    Kidney stone 11/2021   MITRAL REGURGITATION    Myocardial infarction ([facility]    at age [age]  Tobacco abuse     Assessment: [id number] presenting with near syncope, hx CAD, he is not on anticoagulation PTA.  S/p left heart cath today 5/30 found severe 3 vessel obstructive CAD.  TCTS consult ordered, needs CABG later this week.   Heparin rate adjusted overnight, now level is above goal at 0.71. No bleeding issues noted. Will decrease rate and recheck in am.   Goal of Therapy:  Heparin level 0.3-0.7  units/ml Monitor platelets by anticoagulation protocol: Yes   Plan:  Decrease heparin to 1050 units/hr Recheck heparin level in am  Thank you for allowing pharmacy to participate in this patient's care.  FErin HearingPharmD., BCPS Clinical Pharmacist 04/10/2022 7:57 AM

## 2022-04-10 NOTE — Progress Notes (Signed)
Pre-CABG Dopplers completed. Refer to "CV Proc" under chart review to view preliminary results.  04/10/2022 11:10 AM Kelby Aline., MHA, RVT, RDCS, RDMS

## 2022-04-10 NOTE — Progress Notes (Signed)
   04/10/22 1530  Clinical Encounter Type  Visited With  (Significant Other: Joann Cowett)  Visit Type Follow-up (Answer Further Questions Advance Directive)  Referral From Family (Significant Other: Joann Cowett)  Consult/Referral To Chaplain Albertina Parr [employer])  Recommendations Answer Further Questions Advance Directive   Patient's significant other asked me to speak with Mr. Brailsford regarding further questions he had regarding Advance Directive. Met Mr. Narine at patient's bedside to answer additional questions relating to his Advance Directive. While visiting patient's sister arrived and he wanted to have discussion with her about being alternate. Patient would like document notarized prior to Friday morning surgery. [address] [place], M. Min., [phone].

## 2022-04-10 NOTE — Progress Notes (Signed)
CARDIAC REHAB PHASE I   PRE:  Rate/Rhythm: 75 NSR  BP:  Sitting: 138/87      SaO2: 97 RA  MODE:  Ambulation: >2000 ft   POST:  Rate/Rhythm: 102  BP:  Sitting: 152/81      SaO2: 100 RA   Seen pt from [id number], pt is in better spirits today. Pt questions were answered to satisfactory. Pt was ambulated through hallways with standby assistance. Pt did not report dizziness, chest pain, or any unusual symptoms. Pt was returned to room.  Austin Vincent  1:48 PM 04/10/2022

## 2022-04-10 NOTE — Progress Notes (Signed)
Progress Note  Patient Name: Austin Vincent Date of Encounter: 04/10/2022  [facility] Cardiologist: Kirk Ruths, MD   Subjective   Evaluated by CTS and planning CABG this week.  Patient doing well without recurrent angina.  Inpatient Medications    Scheduled Meds:  amLODipine  2.5 mg Oral Daily   aspirin EC  81 mg Oral Daily   atorvastatin  80 mg Oral Daily   ezetimibe  10 mg Oral Daily   melatonin  3 mg Oral Once   sodium chloride flush  3 mL Intravenous Q12H   sodium chloride flush  3 mL Intravenous Q12H   sodium chloride flush  3 mL Intravenous Q12H   Continuous Infusions:  sodium chloride     sodium chloride     ampicillin-sulbactam (UNASYN) IV 1.5 g (04/10/22 0617)   heparin 1,150 Units/hr (04/10/22 0617)   PRN Meds: sodium chloride, sodium chloride, acetaminophen **OR** acetaminophen, nitroGLYCERIN, sodium chloride flush, sodium chloride flush   Vital Signs    Vitals:   04/09/22 1130 04/09/22 2025 04/10/22 0408 04/10/22 0858  BP: 134/82 134/86 127/69   Pulse: (!) 56 (!) 54 (!) 52   Resp:  15    Temp:  (!) 97.5 F (36.4 C) 97.7 F (36.5 C) 98 F (36.7 C)  TempSrc:  Oral Oral Oral  SpO2:  98% 98%   Weight:   73 kg   Height:        Intake/Output Summary (Last 24 hours) at 04/10/2022 0946 Last data filed at 04/10/2022 0617 Gross per 24 hour  Intake 1043 ml  Output 650 ml  Net 393 ml      04/10/2022    4:08 AM 04/09/2022    5:15 AM 04/08/2022    5:00 AM  Last 3 Weights  Weight (lbs) 160 lb 14.4 oz 160 lb 14.4 oz 165 lb 2 oz  Weight (kg) 72.984 kg 72.984 kg 74.9 kg      Telemetry    Sinus bradycardia- Personally Reviewed  Physical Exam   GEN: No acute distress.   Neck: No JVD Cardiac: RRR, no murmurs, rubs, or gallops.  Respiratory: Clear to auscultation bilaterally. GI: Soft, nontender, non-distended  MS: No edema Neuro:  Nonfocal  Psych: Normal affect   Labs    High Sensitivity Troponin:   Recent Labs  Lab  04/06/22 2055 04/07/22 0314 04/07/22 0949 04/07/22 1522 04/07/22 2050  TROPONINIHS 1,433* 2,302* 1,579* 1,148* 924*     Chemistry Recent Labs  Lab 04/06/22 1433 04/07/22 0314 04/08/22 0139 04/09/22 0332 04/10/22 0319  NA 136   < > 137 135 137  K 3.8   < > 4.1 4.2 4.0  CL 103   < > 108 105 107  CO2 25   < > '24 22 26  '$ GLUCOSE 119*   < > 113* 99 108*  BUN 12   < > '11 10 9  '$ CREATININE 0.95   < > 0.85 0.89 0.96  CALCIUM 8.9   < > 8.4* 8.9 8.9  MG  --   --  1.9 2.0 2.1  PROT 6.4*  --   --   --  6.4*  ALBUMIN 4.0  --   --   --  3.7  AST 31  --   --   --  100*  ALT 37  --   --   --  112*  ALKPHOS 51  --   --   --  55  BILITOT 0.9  --   --   --  0.6  GFRNONAA >60   < > >60 >60 >60  ANIONGAP 8   < > 5 8 4*   < > = values in this interval not displayed.     Hematology Recent Labs  Lab 04/08/22 0139 04/09/22 0332 04/10/22 0319  WBC 6.6 7.0 6.5  RBC 4.19* 4.47 4.60  HGB 12.8* 13.7 14.2  HCT 36.4* 38.3* 39.3  MCV 86.9 85.7 85.4  MCH 30.5 30.6 30.9  MCHC 35.2 35.8 36.1*  RDW 12.1 12.1 12.3  PLT 162 170 172    DDimer  Recent Labs  Lab 04/06/22 1449  DDIMER <0.27     Radiology    DG Chest 2 View  Result Date: 04/10/2022 CLINICAL DATA:  Chest pain. EXAM: CHEST - 2 VIEW COMPARISON:  CTA chest, abdomen and pelvis 04/06/2022. FINDINGS: Irregular opacities previously seen in the right upper lobe appear to be less dense today consistent with a partially resolved pneumonic infiltrate most likely. The remaining lungs are generally clear. No pleural effusion is seen. There is no new or worsened opacity. The cardiac size is normal. Unremarkable mediastinal configuration. Thoracic cage is intact. IMPRESSION: Irregular right upper lobe opacities in the lateral apical area are less dense today most likely due to partial resolution of an infectious process. No new abnormality. Electronically Signed   By: Telford Nab M.D.   On: 04/10/2022 06:32   CARDIAC CATHETERIZATION  Result  Date: 04/09/2022   Mid LAD lesion is 100% stenosed.   1st Diag lesion is 90% stenosed.   1st Mrg lesion is 50% stenosed.   Mid Cx lesion is 90% stenosed.   2nd Mrg lesion is 90% stenosed.   Ost RCA to Mid RCA lesion is 95% stenosed.   Mid RCA to Dist RCA lesion is 30% stenosed.   There is mild left ventricular systolic dysfunction.   LV end diastolic pressure is normal.   The left ventricular ejection fraction is 50-55% by visual estimate. Severe 3 vessel obstructive CAD Mildly reduced LV function. Inferoapical HK Normal LVEDP Plan: Needs CT surgery consult for CABG.   CT RENAL STONE STUDY  Result Date: 04/08/2022 CLINICAL DATA:  Flank pain.  Kidney stone suspected. EXAM: CT ABDOMEN AND PELVIS WITHOUT CONTRAST TECHNIQUE: Multidetector CT imaging of the abdomen and pelvis was performed following the standard protocol without IV contrast. RADIATION DOSE REDUCTION: This exam was performed according to the departmental dose-optimization program which includes automated exposure control, adjustment of the mA and/or kV according to patient size and/or use of iterative reconstruction technique. COMPARISON:  CT a chest, abdomen and pelvis dated 04/06/2022. FINDINGS: Lower chest: No acute abnormality. Hepatobiliary: No focal liver abnormality is seen. No gallstones, gallbladder wall thickening, or biliary dilatation. Pancreas: Unremarkable. No pancreatic ductal dilatation or surrounding inflammatory changes. Spleen: Normal in size without focal abnormality. Adrenals/Urinary Tract: No adrenal masses. Kidneys are normal in size, orientation and position. No renal masses, stones or hydronephrosis. Ureters are normal in course and in caliber. No ureteral stones. Normal bladder. Stomach/Bowel: Stomach moderately distended, otherwise unremarkable. Small bowel and colon are normal in caliber. No wall thickening. No inflammation. Multiple colonic diverticula. Normal appendix. Vascular/Lymphatic: Aortic atherosclerosis. No  aneurysm. No enlarged lymph nodes. Reproductive: Unremarkable. Other: No abdominal wall hernia or abnormality. No abdominopelvic ascites. Musculoskeletal: No fracture or acute finding.  No bone lesion. IMPRESSION: 1. No acute findings within the abdomen or pelvis. No renal or ureteral stones or obstructive uropathy. No findings to account for flank pain. 2. Colonic diverticula without  evidence of diverticulitis. 3. Aortic atherosclerosis. Electronically Signed   By: Lajean Manes M.D.   On: 04/08/2022 16:11     Patient Profile     51 y.o. male with history of CAD s/p PCI to RCA at age 27 with NSTEMI in 2015 found to have occluded LAD (chronic) and RCA s/p PCI to RCA since that time, HTN, HLD and medullary thyroid cancer who presented with episode of syncope found to have elevated troponin and TWI in inferolateral leads for which Cardiology was consulted.  Troponins elevated.  Echocardiogram shows ejection fraction 50 to 55%, distal septal/apical hypokinesis, mild left atrial enlargement.  Assessment & Plan    ACS/Non-ST elevation myocardial infarction-patient admitted with syncope:  CABG later this week depending on course of infiltrate.  CXR today looks improved.  Cont hep, ASA, statin.  No BB due to bradycardia.  2. Syncope: Unclear etiology; TTE with normal EF. Previously instructed that patient will not be able to drive for 6 months following recent event.  No evidence of NSVT/VT on monitor.  3. Medullary thyroid cancer: Patient will need surgical resection after MVD addressed  4. Hypertension: BP well controlled.  5.  Hyperlipidemia:  Continue statin.  6.  Abnormal chest CT: Will need follow-up noncontrast chest CT in 3 months.  For questions or updates, please contact [place] Please consult [email or web address] for contact info under        Signed, Early Osmond, MD  04/10/2022, 9:46 AM   Patient ID: Morrell Riddle, male   DOB: 03-06-1971, 51 y.o.   MRN: [id number]

## 2022-04-11 DIAGNOSIS — I249 Acute ischemic heart disease, unspecified: Secondary | ICD-10-CM | POA: Diagnosis not present

## 2022-04-11 DIAGNOSIS — I7090 Unspecified atherosclerosis: Secondary | ICD-10-CM | POA: Diagnosis not present

## 2022-04-11 DIAGNOSIS — R9431 Abnormal electrocardiogram [ECG] [EKG]: Secondary | ICD-10-CM | POA: Diagnosis not present

## 2022-04-11 DIAGNOSIS — R55 Syncope and collapse: Secondary | ICD-10-CM | POA: Diagnosis not present

## 2022-04-11 DIAGNOSIS — I2510 Atherosclerotic heart disease of native coronary artery without angina pectoris: Secondary | ICD-10-CM

## 2022-04-11 LAB — ABO/RH: ABO/RH(D): A POS

## 2022-04-11 LAB — CBC WITH DIFFERENTIAL/PLATELET
Abs Immature Granulocytes: 0.01 10*3/uL (ref 0.00–0.07)
Basophils Absolute: 0 10*3/uL (ref 0.0–0.1)
Basophils Relative: 1 %
Eosinophils Absolute: 0.4 10*3/uL (ref 0.0–0.5)
Eosinophils Relative: 6 %
HCT: 40.4 % (ref 39.0–52.0)
Hemoglobin: 14.5 g/dL (ref 13.0–17.0)
Immature Granulocytes: 0 %
Lymphocytes Relative: 26 %
Lymphs Abs: 1.9 10*3/uL (ref 0.7–4.0)
MCH: 30.5 pg (ref 26.0–34.0)
MCHC: 35.9 g/dL (ref 30.0–36.0)
MCV: 85.1 fL (ref 80.0–100.0)
Monocytes Absolute: 0.6 10*3/uL (ref 0.1–1.0)
Monocytes Relative: 8 %
Neutro Abs: 4.3 10*3/uL (ref 1.7–7.7)
Neutrophils Relative %: 59 %
Platelets: 160 10*3/uL (ref 150–400)
RBC: 4.75 MIL/uL (ref 4.22–5.81)
RDW: 12 % (ref 11.5–15.5)
WBC: 7.2 10*3/uL (ref 4.0–10.5)
nRBC: 0 % (ref 0.0–0.2)

## 2022-04-11 LAB — BASIC METABOLIC PANEL
Anion gap: 5 (ref 5–15)
BUN: 5 mg/dL — ABNORMAL LOW (ref 6–20)
CO2: 25 mmol/L (ref 22–32)
Calcium: 9.5 mg/dL (ref 8.9–10.3)
Chloride: 106 mmol/L (ref 98–111)
Creatinine, Ser: 0.82 mg/dL (ref 0.61–1.24)
GFR, Estimated: >60 mL/min (ref >60)
Glucose, Bld: 116 mg/dL — ABNORMAL HIGH (ref 70–99)
Potassium: 3.9 mmol/L (ref 3.5–5.1)
Sodium: 136 mmol/L (ref 135–145)

## 2022-04-11 LAB — MAGNESIUM: Magnesium: 2 mg/dL (ref 1.7–2.4)

## 2022-04-11 LAB — PREPARE RBC (CROSSMATCH)

## 2022-04-11 LAB — LIPOPROTEIN A (LPA): Lipoprotein (a): 183.1 nmol/L — ABNORMAL HIGH (ref <75.0)

## 2022-04-11 LAB — GLUCOSE, CAPILLARY: Glucose-Capillary: 118 mg/dL — ABNORMAL HIGH (ref 70–99)

## 2022-04-11 LAB — HEPARIN LEVEL (UNFRACTIONATED): Heparin Unfractionated: 0.51 IU/mL (ref 0.30–0.70)

## 2022-04-11 MED ORDER — CHLORHEXIDINE GLUCONATE 0.12 % MT SOLN
15.0000 mL | Freq: Once | OROMUCOSAL | Status: AC
Start: 2022-04-12 — End: 2022-04-12
  Administered 2022-04-12: 15 mL via OROMUCOSAL
  Filled 2022-04-11: qty 15

## 2022-04-11 MED ORDER — TRANEXAMIC ACID 1000 MG/10ML IV SOLN
1.5000 mg/kg/h | INTRAVENOUS | Status: AC
  Administered 2022-04-12: 1.5 mg/kg/h via INTRAVENOUS
  Filled 2022-04-11: qty 25

## 2022-04-11 MED ORDER — DIAZEPAM 5 MG PO TABS
5.0000 mg | ORAL_TABLET | Freq: Once | ORAL | Status: AC
Start: 2022-04-12 — End: 2022-04-12
  Administered 2022-04-12: 5 mg via ORAL
  Filled 2022-04-11: qty 1

## 2022-04-11 MED ORDER — PLASMA-LYTE A IV SOLN
INTRAVENOUS | Status: DC
  Filled 2022-04-11: qty 2.5

## 2022-04-11 MED ORDER — CALCIUM POLYCARBOPHIL 625 MG PO TABS
625.0000 mg | ORAL_TABLET | Freq: Every day | ORAL | Status: DC
  Administered 2022-04-11 – 2022-04-17 (×6): 625 mg via ORAL
  Filled 2022-04-11 (×7): qty 1

## 2022-04-11 MED ORDER — TEMAZEPAM 15 MG PO CAPS
15.0000 mg | ORAL_CAPSULE | Freq: Once | ORAL | Status: AC | PRN
  Administered 2022-04-11: 15 mg via ORAL
  Filled 2022-04-11: qty 1

## 2022-04-11 MED ORDER — CEFAZOLIN SODIUM-DEXTROSE 2-4 GM/100ML-% IV SOLN
2.0000 g | INTRAVENOUS | Status: AC
  Administered 2022-04-12 (×2): 2 g via INTRAVENOUS
  Filled 2022-04-11: qty 100

## 2022-04-11 MED ORDER — DOCUSATE SODIUM 100 MG PO CAPS
100.0000 mg | ORAL_CAPSULE | Freq: Two times a day (BID) | ORAL | Status: DC
  Administered 2022-04-11 (×2): 100 mg via ORAL
  Filled 2022-04-11 (×2): qty 1

## 2022-04-11 MED ORDER — INSULIN REGULAR(HUMAN) IN NACL 100-0.9 UT/100ML-% IV SOLN
INTRAVENOUS | Status: AC
  Administered 2022-04-12: .9 [IU]/h via INTRAVENOUS
  Filled 2022-04-11: qty 100

## 2022-04-11 MED ORDER — ALUM & MAG HYDROXIDE-SIMETH 200-200-20 MG/5ML PO SUSP
30.0000 mL | ORAL | Status: DC | PRN
  Administered 2022-04-11: 30 mL via ORAL
  Filled 2022-04-11: qty 30

## 2022-04-11 MED ORDER — METOPROLOL TARTRATE 12.5 MG HALF TABLET
12.5000 mg | ORAL_TABLET | Freq: Once | ORAL | Status: AC
Start: 2022-04-12 — End: 2022-04-12
  Administered 2022-04-12: 12.5 mg via ORAL
  Filled 2022-04-11: qty 1

## 2022-04-11 MED ORDER — SORBITOL 70 % SOLN
60.0000 mL | Freq: Once | Status: AC
Start: 2022-04-11 — End: 2022-04-11
  Administered 2022-04-11: 60 mL via ORAL
  Filled 2022-04-11: qty 60

## 2022-04-11 MED ORDER — VANCOMYCIN HCL 1250 MG/250ML IV SOLN
1250.0000 mg | INTRAVENOUS | Status: AC
  Administered 2022-04-12: 1250 mg via INTRAVENOUS
  Filled 2022-04-11: qty 250

## 2022-04-11 MED ORDER — URELLE 81 MG PO TABS
1.0000 | ORAL_TABLET | Freq: Three times a day (TID) | ORAL | Status: DC
Start: 2022-04-11 — End: 2022-04-12
  Administered 2022-04-11 (×2): 81 mg via ORAL
  Filled 2022-04-11 (×3): qty 1

## 2022-04-11 MED ORDER — POLYETHYLENE GLYCOL 3350 17 G PO PACK
17.0000 g | PACK | Freq: Every day | ORAL | Status: DC
  Administered 2022-04-11: 17 g via ORAL
  Filled 2022-04-11: qty 1

## 2022-04-11 MED ORDER — MAGNESIUM SULFATE 50 % IJ SOLN
40.0000 meq | INTRAMUSCULAR | Status: DC
  Filled 2022-04-11: qty 9.85

## 2022-04-11 MED ORDER — AMLODIPINE BESYLATE 10 MG PO TABS
10.0000 mg | ORAL_TABLET | Freq: Every day | ORAL | Status: DC
  Administered 2022-04-11: 10 mg via ORAL
  Filled 2022-04-11: qty 1

## 2022-04-11 MED ORDER — DEXMEDETOMIDINE HCL IN NACL 400 MCG/100ML IV SOLN
0.1000 ug/kg/h | INTRAVENOUS | Status: AC
  Administered 2022-04-12: .5 ug/kg/h via INTRAVENOUS
  Filled 2022-04-11: qty 100

## 2022-04-11 MED ORDER — NOREPINEPHRINE 4 MG/250ML-% IV SOLN
0.0000 ug/min | INTRAVENOUS | Status: AC
  Administered 2022-04-12: 2 ug/min via INTRAVENOUS
  Filled 2022-04-11: qty 250

## 2022-04-11 MED ORDER — MILRINONE LACTATE IN DEXTROSE 20-5 MG/100ML-% IV SOLN
0.3000 ug/kg/min | INTRAVENOUS | Status: AC
  Administered 2022-04-12: .25 ug/kg/min via INTRAVENOUS
  Filled 2022-04-11: qty 100

## 2022-04-11 MED ORDER — CHLORHEXIDINE GLUCONATE CLOTH 2 % EX PADS
6.0000 | MEDICATED_PAD | Freq: Once | CUTANEOUS | Status: AC
Start: 2022-04-11 — End: 2022-04-11
  Administered 2022-04-11: 6 via TOPICAL

## 2022-04-11 MED ORDER — CEFAZOLIN SODIUM-DEXTROSE 2-4 GM/100ML-% IV SOLN
2.0000 g | INTRAVENOUS | Status: DC
  Filled 2022-04-11: qty 100

## 2022-04-11 MED ORDER — BISACODYL 5 MG PO TBEC
5.0000 mg | DELAYED_RELEASE_TABLET | Freq: Once | ORAL | Status: DC
  Filled 2022-04-11: qty 1

## 2022-04-11 MED ORDER — HEPARIN 30,000 UNITS/1000 ML (OHS) CELLSAVER SOLUTION
Status: DC
  Filled 2022-04-11: qty 1000

## 2022-04-11 MED ORDER — TRANEXAMIC ACID (OHS) BOLUS VIA INFUSION
15.0000 mg/kg | INTRAVENOUS | Status: AC
  Administered 2022-04-12: 1105.5 mg via INTRAVENOUS
  Filled 2022-04-11: qty 1106

## 2022-04-11 MED ORDER — PHENYLEPHRINE HCL-NACL 20-0.9 MG/250ML-% IV SOLN
30.0000 ug/min | INTRAVENOUS | Status: AC
  Administered 2022-04-12: 50 ug/min via INTRAVENOUS
  Administered 2022-04-12: 30 ug/min via INTRAVENOUS
  Filled 2022-04-11: qty 250

## 2022-04-11 MED ORDER — TRANEXAMIC ACID (OHS) PUMP PRIME SOLUTION
2.0000 mg/kg | INTRAVENOUS | Status: DC
  Filled 2022-04-11: qty 1.47

## 2022-04-11 MED ORDER — EPINEPHRINE HCL 5 MG/250ML IV SOLN IN NS
0.0000 ug/min | INTRAVENOUS | Status: DC
  Filled 2022-04-11: qty 250

## 2022-04-11 MED ORDER — MELATONIN 5 MG PO TABS
5.0000 mg | ORAL_TABLET | Freq: Every day | ORAL | Status: DC
  Administered 2022-04-11 – 2022-04-16 (×5): 5 mg via ORAL
  Filled 2022-04-11 (×5): qty 1

## 2022-04-11 MED ORDER — CHLORHEXIDINE GLUCONATE CLOTH 2 % EX PADS
6.0000 | MEDICATED_PAD | Freq: Once | CUTANEOUS | Status: AC
Start: 2022-04-12 — End: 2022-04-12
  Administered 2022-04-12: 6 via TOPICAL

## 2022-04-11 MED ORDER — POTASSIUM CHLORIDE 2 MEQ/ML IV SOLN
80.0000 meq | INTRAVENOUS | Status: DC
  Filled 2022-04-11: qty 40

## 2022-04-11 MED ORDER — MELATONIN 3 MG PO TABS: 3.0000 mg | ORAL_TABLET | Freq: Every day | ORAL | Status: DC

## 2022-04-11 MED ORDER — NITROGLYCERIN IN D5W 200-5 MCG/ML-% IV SOLN
2.0000 ug/min | INTRAVENOUS | Status: AC
  Administered 2022-04-12: 16.6 ug/min via INTRAVENOUS
  Filled 2022-04-11: qty 250

## 2022-04-11 MED ORDER — MELATONIN 3 MG PO TABS
3.0000 mg | ORAL_TABLET | Freq: Every evening | ORAL | Status: DC | PRN
  Administered 2022-04-11: 3 mg via ORAL
  Filled 2022-04-11: qty 1

## 2022-04-11 NOTE — Progress Notes (Signed)
[place] for heparin Indication: chest pain/ACS  No Known Allergies  Patient Measurements: Height: '5\' 8"'$  (172.7 cm) Weight: 73.7 kg (162 lb 6.4 oz) (scale a) IBW/kg (Calculated) : 68.4 Heparin Dosing Weight: 73.5 kg  Vital Signs: Temp: 97.7 F (36.5 C) (06/01 0343) Temp Source: Oral (06/01 0343) BP: 151/90 (06/01 0356) Pulse Rate: 71 (06/01 0343)  Labs: Recent Labs    04/09/22 0332 04/09/22 2322 04/10/22 0319 04/10/22 1133 04/11/22 0405  HGB 13.7  --  14.2  --  14.5  HCT 38.3*  --  39.3  --  40.4  PLT 170  --  172  --  160  HEPARINUNFRC 0.43 0.22* 0.21* 0.71*  --   CREATININE 0.89  --  0.96  --  0.82     Estimated Creatinine Clearance: 100.8 mL/min (by C-G formula based on SCr of 0.82 mg/dL).   Medical History: Past Medical History:  Diagnosis Date   CAD (coronary artery disease)    a. s/p MI @ age 89 with PCI of the RCA;  b. CTO of the LAD;  c. 11/2013 NSTEMI/PCI: LM nl, LAD 100 L->L collats, LCX 50p, OM2 80p, OM1 60ost, RCA 100p (3.5x38 Promus DES), RPL 99/49mPTCA only, EF 40-50%;  c. 11/2013 Echo: EF 50-55%, Gr1 DD, mild MR.   HYPERLIPIDEMIA    HYPERTENSION    Kidney stone 11/2021   MITRAL REGURGITATION    Myocardial infarction ([facility]    at age [age]  Tobacco abuse     Assessment: [id number] presenting with near syncope, hx CAD, he is not on anticoagulation PTA.  S/p left heart cath today 5/30 found severe 3 vessel obstructive CAD.  TCTS consult ordered, needs CABG later this week.   Heparin level now at goal at 0.5. No bleeding issues noted.   Goal of Therapy:  Heparin level 0.3-0.7 units/ml Monitor platelets by anticoagulation protocol: Yes   Plan:  Continue heparin at 1050 units/hr Recheck heparin level in am  Thank you for allowing pharmacy to participate in this patient's care.  FErin HearingPharmD., BCPS Clinical Pharmacist 04/11/2022 7:44 AM

## 2022-04-11 NOTE — Anesthesia Preprocedure Evaluation (Addendum)
Anesthesia Evaluation  Patient identified by MRN, date of birth, ID band Patient awake    Reviewed: Allergy & Precautions, NPO status , Patient's Chart, lab work & pertinent test results  Airway Mallampati: II  TM Distance: >3 FB Neck ROM: Full    Dental no notable dental hx. (+) Dental Advisory Given, Teeth Intact   Pulmonary COPD, former smoker,    Pulmonary exam normal breath sounds clear to auscultation       Cardiovascular hypertension, Pt. on medications and Pt. on home beta blockers + CAD and + Past MI  Normal cardiovascular exam Rhythm:Regular Rate:Normal  Echo 03/2022 1. Can consider f/u contrast imaging to look at apex if clinically indicated.  2. Distal septal / apical hypokinesis Apex with calcified band/chords doubt thrombus. Left ventricular ejection fraction, by estimation, is 50 to 55%. The left ventricle has low normal function. The left ventricle demonstrates regional wall motion abnormalities (see scoring diagram/findings for description). Left ventricular diastolic parameters were normal.  3. Right ventricular systolic function is normal. The right ventricular size is normal.  4. Left atrial size was mildly dilated.  5. The mitral valve is abnormal. Trivial mitral valve regurgitation. No evidence of mitral stenosis.  6. The aortic valve is tricuspid. There is mild calcification of the aortic valve. Aortic valve regurgitation is not visualized. No aortic stenosis is present.  7. The inferior vena cava is normal in size with greater than 50% respiratory variability, suggesting right atrial pressure of 3 mmHg.    LHC  .  Mid LAD lesion is 100% stenosed. .  1st Diag lesion is 90% stenosed. .  1st Mrg lesion is 50% stenosed. .  Mid Cx lesion is 90% stenosed. .  2nd Mrg lesion is 90% stenosed. Colon Flattery RCA to Mid RCA lesion is 95% stenosed. .  Mid RCA to Dist RCA lesion is 30% stenosed. .  There is mild left  ventricular systolic dysfunction. .  LV end diastolic pressure is normal. .  The left ventricular ejection fraction is 50-55% by visual estimate.  1. Severe 3 vessel obstructive CAD 2. Mildly reduced LV function. Inferoapical HK 3. Normal LVEDP   Neuro/Psych negative neurological ROS     GI/Hepatic negative GI ROS, Neg liver ROS,   Endo/Other  negative endocrine ROS  Renal/GU Renal disease     Musculoskeletal negative musculoskeletal ROS (+)   Abdominal   Peds  Hematology negative hematology ROS (+)   Anesthesia Other Findings   Reproductive/Obstetrics                           Anesthesia Physical Anesthesia Plan  ASA: 4  Anesthesia Plan: General   Post-op Pain Management:    Induction: Intravenous  PONV Risk Score and Plan: 3 and Midazolam and Treatment may vary due to age or medical condition  Airway Management Planned: Oral ETT  Additional Equipment: Arterial line, CVP, PA Cath, TEE and Ultrasound Guidance Line Placement  Intra-op Plan: Utilization Of Total Body Hypothermia per surgeon request  Post-operative Plan: Post-operative intubation/ventilation  Informed Consent: I have reviewed the patients History and Physical, chart, labs and discussed the procedure including the risks, benefits and alternatives for the proposed anesthesia with the patient or authorized representative who has indicated his/her understanding and acceptance.     Dental advisory given  Plan Discussed with: CRNA  Anesthesia Plan Comments:        Anesthesia Quick Evaluation

## 2022-04-11 NOTE — Progress Notes (Signed)
2 Days Post-Op Procedure(s) (LRB): LEFT HEART CATH AND CORONARY ANGIOGRAPHY (N/A) Subjective: No complaints Ready for CABG in am Objective: Vital signs in last 24 hours: Temp:  [97.7 F (36.5 C)-98 F (36.7 C)] 97.7 F (36.5 C) (06/01 0343) Pulse Rate:  [64-71] 69 (06/01 0836) Cardiac Rhythm: Sinus bradycardia (06/01 0710) Resp:  [10-27] 18 (06/01 0343) BP: (129-162)/(81-94) 148/92 (06/01 0836) SpO2:  [97 %-99 %] 99 % (06/01 0836) Weight:  [73.7 kg] 73.7 kg (06/01 0139)  Hemodynamic parameters for last 24 hours:  stable  Intake/Output from previous day: 05/31 0701 - 06/01 0700 In: 622.5 [P.O.:420; I.V.:90.4; IV Piggyback:112] Out: 1525 [Urine:1525] Intake/Output this shift: Total I/O In: 628.1 [P.O.:240; IV Piggyback:388.1] Out: 500 [Urine:500]    Lab Results: Recent Labs    04/10/22 0319 04/11/22 0405  WBC 6.5 7.2  HGB 14.2 14.5  HCT 39.3 40.4  PLT 172 160   BMET:  Recent Labs    04/10/22 0319 04/11/22 0405  NA 137 136  K 4.0 3.9  CL 107 106  CO2 26 25  GLUCOSE 108* 116*  BUN 9 5*  CREATININE 0.96 0.82  CALCIUM 8.9 9.5    PT/INR: No results for input(s): LABPROT, INR in the last 72 hours. ABG No results found for: PHART, HCO3, TCO2, ACIDBASEDEF, O2SAT CBG (last 3)  Recent Labs    04/11/22 0611  GLUCAP 118*    Assessment/Plan: S/P Procedure(s) (LRB): LEFT HEART CATH AND CORONARY ANGIOGRAPHY (N/A) CABG with L radial harvest in am   LOS: 4 days    Austin Vincent 04/11/2022

## 2022-04-11 NOTE — Progress Notes (Signed)
   04/11/22 1315  Clinical Encounter Type  Visited With Patient;Health care provider Austin Vincent; Austin Vincent; Austin Vincent)  Visit Type Follow-up;Pre-op (Signing of Advance Directive)  Referral From Nurse (Austin Vincent, Therapist, sports)  Consult/Referral To Chaplain Austin Vincent)  Advance Directives (For Healthcare)  Does Patient Have a Medical Advance Directive? Yes  Type of Paramedic of [place];Living will  Copy of [place] in Chart? Yes - validated most recent copy scanned in chart (See row information)  Copy of Living Will in Chart? Yes - validated most recent copy scanned in chart (See row information)   Requested to coordinate Notary and two volunteers from [employer] for the signing of [name]'s Advance Directive: [place] and Living Will. Met Austin Vincent at the patient's bedside. Patient indicated that he wishes for Austin Vincent and Austin Vincent to be his [place], which is clearly indicated on PART A: H.C. P.O.A. and that his wishes were clearly indicated on PART B: Living Will   Austin Vincent signed his Advance Directive in the presence of Austin Vincent; and volunteer witnesses Austin Vincent [employer] and Austin Vincent, all of whom have signed in the appropriate spaces on Part: C.    A copy of [name]'s A.D. was placed in his hard chart, a copy was emailed for scanning into his electronic medical record, the original and one copy were then returned to the patient.    [place], [place]. [phone].

## 2022-04-11 NOTE — Progress Notes (Signed)
Progress Note  Patient Name: Austin Vincent Date of Encounter: 04/11/2022  [facility] Cardiologist: Kirk Ruths, MD   Subjective   No acute events overnight.  On schedule for OR tomorrow.  Had problems with sleep and constipation yesterday.  Inpatient Medications    Scheduled Meds:  amLODipine  2.5 mg Oral Daily   aspirin EC  81 mg Oral Daily   atorvastatin  80 mg Oral Daily   ezetimibe  10 mg Oral Daily   fluticasone furoate-vilanterol  1 puff Inhalation Daily   guaiFENesin  600 mg Oral BID   melatonin  3 mg Oral Once   polyethylene glycol  17 g Oral Daily   sodium chloride flush  3 mL Intravenous Q12H   sodium chloride flush  3 mL Intravenous Q12H   sodium chloride flush  3 mL Intravenous Q12H   Continuous Infusions:  sodium chloride     sodium chloride     ampicillin-sulbactam (UNASYN) IV 1.5 g (04/11/22 7253)   heparin 1,050 Units/hr (04/10/22 1613)   PRN Meds: sodium chloride, sodium chloride, acetaminophen **OR** acetaminophen, melatonin, nitroGLYCERIN, sodium chloride flush, sodium chloride flush   Vital Signs    Vitals:   04/11/22 0343 04/11/22 0356 04/11/22 0740 04/11/22 0836  BP: (!) 162/94 (!) 151/90  (!) 148/92  Pulse: 71   69  Resp: 18     Temp: 97.7 F (36.5 C)     TempSrc: Oral     SpO2: 98%  97%   Weight:      Height:        Intake/Output Summary (Last 24 hours) at 04/11/2022 0900 Last data filed at 04/10/2022 2255 Gross per 24 hour  Intake 622.45 ml  Output 1525 ml  Net -902.55 ml      04/11/2022    1:39 AM 04/10/2022    4:08 AM 04/09/2022    5:15 AM  Last 3 Weights  Weight (lbs) 162 lb 6.4 oz 160 lb 14.4 oz 160 lb 14.4 oz  Weight (kg) 73.664 kg 72.984 kg 72.984 kg      Telemetry    Sinus bradycardia without NSVT- Personally Reviewed  Physical Exam   GEN: No acute distress.   Neck: No JVD Cardiac: RRR, no murmurs, rubs, or gallops.  Respiratory: Clear to auscultation bilaterally. GI: Soft, nontender, non-distended,  +BS MS: No edema Neuro:  Nonfocal  Psych: Normal affect   Labs    High Sensitivity Troponin:   Recent Labs  Lab 04/06/22 2055 04/07/22 0314 04/07/22 0949 04/07/22 1522 04/07/22 2050  TROPONINIHS 1,433* 2,302* 1,579* 1,148* 924*     Chemistry Recent Labs  Lab 04/06/22 1433 04/07/22 0314 04/09/22 0332 04/10/22 0319 04/11/22 0405  NA 136   < > 135 137 136  K 3.8   < > 4.2 4.0 3.9  CL 103   < > 105 107 106  CO2 25   < > '22 26 25  '$ GLUCOSE 119*   < > 99 108* 116*  BUN 12   < > 10 9 5*  CREATININE 0.95   < > 0.89 0.96 0.82  CALCIUM 8.9   < > 8.9 8.9 9.5  MG  --    < > 2.0 2.1 2.0  PROT 6.4*  --   --  6.4*  --   ALBUMIN 4.0  --   --  3.7  --   AST 31  --   --  100*  --   ALT 37  --   --  112*  --   ALKPHOS 51  --   --  55  --   BILITOT 0.9  --   --  0.6  --   GFRNONAA >60   < > >60 >60 >60  ANIONGAP 8   < > 8 4* 5   < > = values in this interval not displayed.     Hematology Recent Labs  Lab 04/09/22 0332 04/10/22 0319 04/11/22 0405  WBC 7.0 6.5 7.2  RBC 4.47 4.60 4.75  HGB 13.7 14.2 14.5  HCT 38.3* 39.3 40.4  MCV 85.7 85.4 85.1  MCH 30.6 30.9 30.5  MCHC 35.8 36.1* 35.9  RDW 12.1 12.3 12.0  PLT 170 172 160    DDimer  Recent Labs  Lab 04/06/22 1449  DDIMER <0.27     Radiology    DG Chest 2 View  Result Date: 04/10/2022 CLINICAL DATA:  Chest pain. EXAM: CHEST - 2 VIEW COMPARISON:  CTA chest, abdomen and pelvis 04/06/2022. FINDINGS: Irregular opacities previously seen in the right upper lobe appear to be less dense today consistent with a partially resolved pneumonic infiltrate most likely. The remaining lungs are generally clear. No pleural effusion is seen. There is no new or worsened opacity. The cardiac size is normal. Unremarkable mediastinal configuration. Thoracic cage is intact. IMPRESSION: Irregular right upper lobe opacities in the lateral apical area are less dense today most likely due to partial resolution of an infectious process. No new  abnormality. Electronically Signed   By: Telford Nab M.D.   On: 04/10/2022 06:32   VAS US DOPPLER PRE CABG  Result Date: 04/10/2022 PREOPERATIVE VASCULAR EVALUATION Patient Name:  JIYAN WALKOWSKI [facility]  Date of Exam:   04/10/2022 Medical Rec #: [id number]            Accession #:    [id number] Date of Birth: 07-Jun-1971             Patient Gender: M Patient Age:   51 years Exam Location:  [facility] Procedure:      VAS US DOPPLER PRE CABG Referring Phys: [name] VANTRIGT --------------------------------------------------------------------------------  Indications:      Pre-CABG. Risk Factors:     Hypertension, hyperlipidemia, prior MI, coronary artery                   disease. Comparison Study: No prior study Performing Technologist: Maudry Mayhew MHA, RVT, RDCS, RDMS  Examination Guidelines: A complete evaluation includes B-mode imaging, spectral Doppler, color Doppler, and power Doppler as needed of all accessible portions of each vessel. Bilateral testing is considered an integral part of a complete examination. Limited examinations for reoccurring indications may be performed as noted.  Right Carotid Findings: +----------+--------+--------+--------+--------+------------------+           PSV cm/sEDV cm/sStenosisDescribeComments           +----------+--------+--------+--------+--------+------------------+ CCA Prox  93      22                                         +----------+--------+--------+--------+--------+------------------+ CCA Distal82      21                                         +----------+--------+--------+--------+--------+------------------+ ICA Prox  76      23  intimal thickening +----------+--------+--------+--------+--------+------------------+ ICA Distal98      38                                         +----------+--------+--------+--------+--------+------------------+ ECA       103     14                                          +----------+--------+--------+--------+--------+------------------+ +----------+--------+-------+----------------+------------+           PSV cm/sEDV cmsDescribe        Arm Pressure +----------+--------+-------+----------------+------------+ Subclavian121            Multiphasic, WNL             +----------+--------+-------+----------------+------------+ +---------+--------+--+--------+--+---------+ VertebralPSV cm/s54EDV cm/s17Antegrade +---------+--------+--+--------+--+---------+ Left Carotid Findings: +----------+--------+--------+--------+-----------------------+--------+           PSV cm/sEDV cm/sStenosisDescribe               Comments +----------+--------+--------+--------+-----------------------+--------+ CCA Prox  122     24                                              +----------+--------+--------+--------+-----------------------+--------+ CCA Distal84      20                                              +----------+--------+--------+--------+-----------------------+--------+ ICA Prox  84      36              smooth and heterogenous         +----------+--------+--------+--------+-----------------------+--------+ ICA Distal96      44                                              +----------+--------+--------+--------+-----------------------+--------+ ECA       88      16              heterogenous                    +----------+--------+--------+--------+-----------------------+--------+ +----------+--------+--------+----------------+------------+ SubclavianPSV cm/sEDV cm/sDescribe        Arm Pressure +----------+--------+--------+----------------+------------+           170             Multiphasic, WNL             +----------+--------+--------+----------------+------------+ +---------+--------+--+--------+--+---------+ VertebralPSV cm/s52EDV cm/s18Antegrade +---------+--------+--+--------+--+---------+  ABI Findings:  +--------+------------------+-----+---------+--------+ Right   Rt Pressure (mmHg)IndexWaveform Comment  +--------+------------------+-----+---------+--------+ [id number]                    triphasic         +--------+------------------+-----+---------+--------+ PTA                            biphasic          +--------+------------------+-----+---------+--------+ DP  triphasic         +--------+------------------+-----+---------+--------+ +--------+------------------+-----+---------+-------+ Left    Lt Pressure (mmHg)IndexWaveform Comment +--------+------------------+-----+---------+-------+ [id number]                    triphasic        +--------+------------------+-----+---------+-------+ PTA                            triphasic        +--------+------------------+-----+---------+-------+ DP                             triphasic        +--------+------------------+-----+---------+-------+  Right Doppler Findings: +-----------+--------+-----+---------+--------------------+ Site       PressureIndexDoppler  Comments             +-----------+--------+-----+---------+--------------------+ Brachial   175          triphasic                     +-----------+--------+-----+---------+--------------------+ Radial                  triphasic                     +-----------+--------+-----+---------+--------------------+ Ulnar                   biphasic                      +-----------+--------+-----+---------+--------------------+ Palmar Arch                      Within normal limits +-----------+--------+-----+---------+--------------------+  Left Doppler Findings: +-----------+--------+-----+---------+--------------------+ Site       PressureIndexDoppler  Comments             +-----------+--------+-----+---------+--------------------+ Brachial   159          triphasic                      +-----------+--------+-----+---------+--------------------+ Radial                  triphasic                     +-----------+--------+-----+---------+--------------------+ Ulnar                   biphasic                      +-----------+--------+-----+---------+--------------------+ Palmar Arch                      Within normal limits +-----------+--------+-----+---------+--------------------+  Summary: Right Carotid: The extracranial vessels were near-normal with only minimal wall                thickening or plaque. Left Carotid: The extracranial vessels were near-normal with only minimal wall               thickening or plaque. Vertebrals:  Bilateral vertebral arteries demonstrate antegrade flow. Subclavians: Normal flow hemodynamics were seen in bilateral subclavian              arteries. Bilateral ABI: Bilateral pedal waveforms are within normal range at rest. Right Upper Extremity: Doppler waveforms remain within normal limits with right radial compression. Doppler waveforms remain within normal limits with right ulnar compression. Left Upper Extremity: Doppler waveforms remain within  normal limits with left radial compression. Doppler waveforms remain within normal limits with left ulnar compression.  Electronically signed by Harold Barban MD on 04/10/2022 at 7:56:35 PM.    Final      Patient Profile     51 y.o. male with history of CAD s/p PCI to RCA at age 89 with NSTEMI in 2015 found to have occluded LAD (chronic) and RCA s/p PCI to RCA since that time, HTN, HLD and medullary thyroid cancer who presented with episode of syncope found to have elevated troponin and TWI in inferolateral leads for which Cardiology was consulted.  Troponins elevated.  Echocardiogram shows ejection fraction 50 to 55%, distal septal/apical hypokinesis, mild left atrial enlargement.  Assessment & Plan    ACS/Non-ST elevation myocardial infarction-patient admitted with syncope:  CABG tomorrow.  Cont  current therapy.  2. Syncope: Unclear etiology; TTE with normal EF. Previously instructed that patient will not be able to drive for 6 months following recent event.  No evidence of NSVT/VT on monitor.  Cont telemetry for now.  3. Medullary thyroid cancer:  Will need to be addressed following CABG.  4. Hypertension: Increase norvasc to '10mg'$  for better control.  5.  Hyperlipidemia:  Continue statin.  6.  Abnormal chest CT: Will need follow-up noncontrast chest CT in 3 months.  For questions or updates, please contact [place] Please consult [email or web address] for contact info under        Signed, Early Osmond, MD  04/11/2022, 9:00 AM   [phone]

## 2022-04-11 NOTE — Progress Notes (Signed)
CARDIAC REHAB PHASE I   Went to check on pt. Pt resting peacefully in bed. Pt has education materials at bedside. Will continue to follow pt throughout his hospital stay.    Rufina Falco, RN BSN 04/11/2022 1:30 PM

## 2022-04-11 NOTE — Progress Notes (Signed)
[facility]                                                                              Austin Vincent, is a 51 y.o. male, DOB - 02-Jun-1971, XIH:[id number] Admit date - 04/06/2022    Outpatient Primary MD for the patient is Rudd, Lillette Boxer, MD  LOS - 4  days  Chief Complaint  Patient presents with   Loss of Consciousness       Brief summary   Patient is a 51 yo male with newly diagnosed medullary thyroid carcinoma, CAD, HTN, HLD, tobacco use who presented after a syncopal episode in the car. He was already stopped when having the episode and noted associated diaphoresis as well. He regained consciousness when a bystander knocked on his window as well.  He was brought to the ER via EMS for further work-up and evaluation. Of note, he also has been monitored for nephrolithiasis and may have also passed a stone approximately 2 days prior to admission as he had associated groin pain, bloody urine output and probable passage of a stone.  Urine has since cleared after this.   On work-up, EKG showed new T wave inversions, and troponins trended up.  He had no chest pain during work-up.  He was started on a heparin drip and cardiology was consulted.   CT angio chest/abdomen/pelvis was negative for aortic aneurysm or dissection.  Coronary artery calcifications were noted.  There was also clustered nodular consolidation involving right upper lobe with surrounding groundglass nodules measuring up to 12 mm. Due to some concern for possible aspiration he was also started on Unasyn.  Procalcitonin was also elevated. He underwent heart cath on 04/09/2022 which showed severe three-vessel CAD. Cardiothoracic surgery consulted and planning CABG next week   Assessment & Plan    Principal Problem:   Syncope -Unclear etiology, no seizure like activity. - 2D echo showed normal EF.  No arrhythmias on telemetry. -Cardiology, cardiothoracic surgery following  Active Problems:   Non-ST  elevation (NSTEMI) myocardial infarction ([place]) -Noted to have T WI on EKG with uptrending troponins.  Patient was placed on heparin drip and cardiology was consulted. -2D echo showed normal EF 50 to 34%, normal diastolic function, distal septal/apical hypokinesis apex with calcified band/cords.  No LVH, AS/MS -Cardiac cath on 04/09/2022 showed severe three-vessel CAD.   -Plan for CABG tomorrow a.m.  Ground glass opacity present on imaging of lung -Possibly aspirated, leukocytosis noted patient, elevated procalcitonin,  -Placed on IV Unasyn x7 days, no fevers, no leukocytosis, overall improving     Pulmonary nodules - Clustered nodular consolidation noted in right upper lobe with surrounding groundglass opacities noted on CT on admission.  Nodules are new compared to CT performed 01/23/2022.  Nodules measure up to 12 mm per read -Given underlying new medullary thyroid carcinoma, this may need further evaluation but currently NSTEMI taking precedence -Patient and wife are aware of findings and will plan for outpatient follow-up  Nephrolithiasis - History of nephrolithiasis in past requiring lithotripsy.  Appear to be symptomatic in the past 3 to 4 weeks and may have passed the stone 2 days prior to  admission.  Pain, hematuria has resolved. -Patient began developing some abdominal discomfort concerning for recurrent stone on 04/08/2022.  Repeat CT renal stone protocol performed which was negative for nephrolithiasis nor any obstructing stones -Placed on urelle for bladder spasms x 3 doses   Medullary thyroid carcinoma ([name]) - Diagnosed on FNA on 03/07/2022 - Patient following with ENT and endocrinology.  Currently no date set for thyroidectomy; seen by Dr. Constance Vincent with ENT on 03/21/22   Essential hypertension -BP stable, continue Toprol, amlodipine.     Hyperlipidemia -Continue Lipitor 80 mg daily, Zetia  Recent LDL <70   Atherosclerotic vascular disease - per CT: "Extensive abdominopelvic  aortic atherosclerotic disease with branch vessel involvement. Areas of noncritical stenosis are noted involving the superior mesenteric artery, inferior mesenteric artery, and right renal artery." - Continue high intensity statin, ASA   Constipation -Placed on MiraLAX, Colace  Insomnia -Added melatonin     Code Status: Full CODE STATUS DVT Prophylaxis:     Level of Care: Level of care: Telemetry Cardiac Family Communication: Updated patient's significant other at the bedside on 5/31   Disposition Plan:      Remains inpatient appropriate: Plan for CABG tomorrow   Procedures:  2D echo Cardiac cath  Consultants:   Cardiology Cardiothoracic surgery  Antimicrobials:   Anti-infectives (From admission, onward)    Start     Dose/Rate Route Frequency Ordered Stop   04/12/22 0400  vancomycin (VANCOREADY) IVPB 1250 mg/250 mL        1,250 mg 166.7 mL/hr over 90 Minutes Intravenous To Surgery 04/11/22 1356 04/13/22 0400   04/12/22 0400  ceFAZolin (ANCEF) IVPB 2g/100 mL premix        2 g 200 mL/hr over 30 Minutes Intravenous To Surgery 04/11/22 1356 04/13/22 0400   04/12/22 0400  ceFAZolin (ANCEF) IVPB 2g/100 mL premix        2 g 200 mL/hr over 30 Minutes Intravenous To Surgery 04/11/22 1356 04/13/22 0400   04/11/22 1330  Urelle (URELLE/URISED) 81 MG tablet 81 mg        1 tablet Oral 3 times daily 04/11/22 1232 04/12/22 1559   04/06/22 1730  ampicillin-sulbactam (UNASYN) 1.5 g in sodium chloride 0.9 % 100 mL IVPB        1.5 g 200 mL/hr over 30 Minutes Intravenous Every 6 hours 04/06/22 1725            Medications  amLODipine  10 mg Oral Daily   aspirin EC  81 mg Oral Daily   atorvastatin  80 mg Oral Daily   bisacodyl  5 mg Oral Once   docusate sodium  100 mg Oral BID   [START ON 04/12/2022] epinephrine  0-10 mcg/min Intravenous To OR   ezetimibe  10 mg Oral Daily   fluticasone furoate-vilanterol  1 puff Inhalation Daily   guaiFENesin  600 mg Oral BID   [START ON  04/12/2022] heparin-papaverine-plasmalyte irrigation   Irrigation To OR   [START ON 04/12/2022] insulin   Intravenous To OR   [START ON 04/12/2022] magnesium sulfate  40 mEq Other To OR   melatonin  5 mg Oral QHS   [START ON 04/12/2022] phenylephrine  30-200 mcg/min Intravenous To OR   polycarbophil  625 mg Oral Daily   polyethylene glycol  17 g Oral Daily   [START ON 04/12/2022] potassium chloride  80 mEq Other To OR   sodium chloride flush  3 mL Intravenous Q12H   [START ON 04/12/2022] tranexamic acid  15 mg/kg Intravenous  To OR   [START ON 04/12/2022] tranexamic acid  2 mg/kg Intracatheter To OR   Urelle  1 tablet Oral TID      Subjective:   Austin Vincent was seen and examined today.  Had constipation yesterday evening, subsequently did not sleep well until 4 AM.  No other acute complaints.   Patient denies dizziness, chest pain, shortness of breath, abdominal pain, N/V/D/C, new weakness, numbess, tingling.  Objective:   Vitals:   04/11/22 0343 04/11/22 0356 04/11/22 0740 04/11/22 0836  BP: (!) 162/94 (!) 151/90  (!) 148/92  Pulse: 71   69  Resp: 18     Temp: 97.7 F (36.5 C)     TempSrc: Oral     SpO2: 98%  97% 99%  Weight:      Height:        Intake/Output Summary (Last 24 hours) at 04/11/2022 1637 Last data filed at 04/11/2022 0900 Gross per 24 hour  Intake 243 ml  Output --  Net 243 ml     Wt Readings from Last 3 Encounters:  04/11/22 73.7 kg  04/03/22 74.3 kg  03/29/22 73.9 kg    Physical Exam General: Alert and oriented x 3, NAD, pleasant Cardiovascular: S1 S2 clear, RRR. Respiratory: CTAB, Gastrointestinal: Soft, nontender, nondistended, NBS Ext: no pedal edema bilaterally Neuro: no new deficits, ambulating in the room Psych: Normal affect and demeanor, alert and oriented x3      Data Reviewed:  I have personally reviewed following labs    CBC Lab Results  Component Value Date   WBC 7.2 04/11/2022   RBC 4.75 04/11/2022   HGB 14.5 04/11/2022   HCT  40.4 04/11/2022   MCV 85.1 04/11/2022   MCH 30.5 04/11/2022   PLT 160 04/11/2022   MCHC 35.9 04/11/2022   RDW 12.0 04/11/2022   LYMPHSABS 1.9 04/11/2022   MONOABS 0.6 04/11/2022   EOSABS 0.4 04/11/2022   BASOSABS 0.0 68/34/1962     Last metabolic panel Lab Results  Component Value Date   NA 136 04/11/2022   K 3.9 04/11/2022   CL 106 04/11/2022   CO2 25 04/11/2022   BUN 5 (L) 04/11/2022   CREATININE 0.82 04/11/2022   GLUCOSE 116 (H) 04/11/2022   GFRNONAA >60 04/11/2022   GFRAA 109 10/31/2020   CALCIUM 9.5 04/11/2022   PROT 6.4 (L) 04/10/2022   ALBUMIN 3.7 04/10/2022   BILITOT 0.6 04/10/2022   ALKPHOS 55 04/10/2022   AST 100 (H) 04/10/2022   ALT 112 (H) 04/10/2022   ANIONGAP 5 04/11/2022       Radiology Studies: I have personally reviewed the imaging studies  DG Chest 2 View  Result Date: 04/10/2022 CLINICAL DATA:  Chest pain. EXAM: CHEST - 2 VIEW COMPARISON:  CTA chest, abdomen and pelvis 04/06/2022. FINDINGS: Irregular opacities previously seen in the right upper lobe appear to be less dense today consistent with a partially resolved pneumonic infiltrate most likely. The remaining lungs are generally clear. No pleural effusion is seen. There is no new or worsened opacity. The cardiac size is normal. Unremarkable mediastinal configuration. Thoracic cage is intact. IMPRESSION: Irregular right upper lobe opacities in the lateral apical area are less dense today most likely due to partial resolution of an infectious process. No new abnormality. Electronically Signed   By: Telford Nab M.D.   On: 04/10/2022 06:32   CARDIAC CATHETERIZATION  Result Date: 04/09/2022   Mid LAD lesion is 100% stenosed.   1st Diag lesion is 90% stenosed.  1st Mrg lesion is 50% stenosed.   Mid Cx lesion is 90% stenosed.   2nd Mrg lesion is 90% stenosed.   Ost RCA to Mid RCA lesion is 95% stenosed.   Mid RCA to Dist RCA lesion is 30% stenosed.   There is mild left ventricular systolic dysfunction.    LV end diastolic pressure is normal.   The left ventricular ejection fraction is 50-55% by visual estimate. Severe 3 vessel obstructive CAD Mildly reduced LV function. Inferoapical HK Normal LVEDP Plan: Needs CT surgery consult for CABG.   CT RENAL STONE STUDY  Result Date: 04/08/2022 CLINICAL DATA:  Flank pain.  Kidney stone suspected. IMPRESSION: 1. No acute findings within the abdomen or pelvis. No renal or ureteral stones or obstructive uropathy. No findings to account for flank pain. 2. Colonic diverticula without evidence of diverticulitis. 3. Aortic atherosclerosis. Electronically Signed   By: Lajean Manes M.D.   On: 04/08/2022 16:11   VAS US DOPPLER PRE CABG  Result Date: 04/10/2022 PREOPERATIVE VASCULAR EVALUATION Patient Name:  THOMSON HERBERS [facility]  Date of Exam:   04/10/2022 Medical Rec #:  Summary: Right Carotid: The extracranial vessels were near-normal with only minimal wall                thickening or plaque. Left Carotid: The extracranial vessels were near-normal with only minimal wall               thickening or plaque. Vertebrals:  Bilateral vertebral arteries demonstrate antegrade flow. Subclavians: Normal flow hemodynamics were seen in bilateral subclavian              arteries. Bilateral ABI: Bilateral pedal waveforms are within normal range at rest. Right Upper Extremity: Doppler waveforms remain within normal limits with right radial compression. Doppler waveforms remain within normal limits with right ulnar compression. Left Upper Extremity: Doppler waveforms remain within normal limits with left radial compression. Doppler waveforms remain within normal limits with left ulnar compression.    Preliminary        Estill Cotta M.D. [facility] 04/11/2022, 4:37 PM  Available via Epic secure chat 7am-7pm After 7 pm, please refer to night coverage provider listed on amion.

## 2022-04-12 ENCOUNTER — Inpatient Hospital Stay (HOSPITAL_COMMUNITY)
Admission: EM | Disposition: A | Discharge status: Home / Self Care | Payer: Self-pay | Source: Home / Self Care | Attending: Cardiothoracic Surgery

## 2022-04-12 ENCOUNTER — Inpatient Hospital Stay (HOSPITAL_COMMUNITY): Payer: BC Managed Care – PPO

## 2022-04-12 ENCOUNTER — Other Ambulatory Visit: Payer: Self-pay

## 2022-04-12 ENCOUNTER — Encounter (HOSPITAL_COMMUNITY): Payer: Self-pay | Admitting: Internal Medicine

## 2022-04-12 ENCOUNTER — Inpatient Hospital Stay (HOSPITAL_COMMUNITY): Payer: BC Managed Care – PPO | Admitting: Certified Registered"

## 2022-04-12 DIAGNOSIS — I2510 Atherosclerotic heart disease of native coronary artery without angina pectoris: Secondary | ICD-10-CM | POA: Diagnosis present

## 2022-04-12 DIAGNOSIS — Z951 Presence of aortocoronary bypass graft: Secondary | ICD-10-CM

## 2022-04-12 HISTORY — PX: RADIAL ARTERY HARVEST: SHX5067

## 2022-04-12 HISTORY — PX: CORONARY ARTERY BYPASS GRAFT: SHX141

## 2022-04-12 HISTORY — PX: TEE WITHOUT CARDIOVERSION: SHX5443

## 2022-04-12 LAB — HEMOGLOBIN AND HEMATOCRIT, BLOOD
HCT: 24.6 % — ABNORMAL LOW (ref 39.0–52.0)
Hemoglobin: 9 g/dL — ABNORMAL LOW (ref 13.0–17.0)

## 2022-04-12 LAB — CBC WITH DIFFERENTIAL/PLATELET
Abs Immature Granulocytes: 0.03 10*3/uL (ref 0.00–0.07)
Basophils Absolute: 0 10*3/uL (ref 0.0–0.1)
Basophils Relative: 1 %
Eosinophils Absolute: 0.5 10*3/uL (ref 0.0–0.5)
Eosinophils Relative: 6 %
HCT: 41.1 % (ref 39.0–52.0)
Hemoglobin: 14.6 g/dL (ref 13.0–17.0)
Immature Granulocytes: 0 %
Lymphocytes Relative: 27 %
Lymphs Abs: 2.3 10*3/uL (ref 0.7–4.0)
MCH: 30.5 pg (ref 26.0–34.0)
MCHC: 35.5 g/dL (ref 30.0–36.0)
MCV: 85.8 fL (ref 80.0–100.0)
Monocytes Absolute: 0.8 10*3/uL (ref 0.1–1.0)
Monocytes Relative: 9 %
Neutro Abs: 4.7 10*3/uL (ref 1.7–7.7)
Neutrophils Relative %: 57 %
Platelets: 204 10*3/uL (ref 150–400)
RBC: 4.79 MIL/uL (ref 4.22–5.81)
RDW: 12.4 % (ref 11.5–15.5)
WBC: 8.3 10*3/uL (ref 4.0–10.5)
nRBC: 0 % (ref 0.0–0.2)

## 2022-04-12 LAB — POCT I-STAT 7, (LYTES, BLD GAS, ICA,H+H)
Acid-Base Excess: 2 mmol/L (ref 0.0–2.0)
Acid-Base Excess: 4 mmol/L — ABNORMAL HIGH (ref 0.0–2.0)
Acid-Base Excess: 2 mmol/L (ref 0.0–2.0)
Acid-Base Excess: 2 mmol/L (ref 0.0–2.0)
Acid-Base Excess: 0 mmol/L (ref 0.0–2.0)
Acid-Base Excess: 0 mmol/L (ref 0.0–2.0)
Acid-base deficit: 1 mmol/L (ref 0.0–2.0)
Acid-base deficit: 1 mmol/L (ref 0.0–2.0)
Bicarbonate: 29 mmol/L — ABNORMAL HIGH (ref 20.0–28.0)
Bicarbonate: 27.9 mmol/L (ref 20.0–28.0)
Bicarbonate: 25.8 mmol/L (ref 20.0–28.0)
Bicarbonate: 25.9 mmol/L (ref 20.0–28.0)
Bicarbonate: 23.8 mmol/L (ref 20.0–28.0)
Bicarbonate: 23.9 mmol/L (ref 20.0–28.0)
Bicarbonate: 25.4 mmol/L (ref 20.0–28.0)
Bicarbonate: 26 mmol/L (ref 20.0–28.0)
Calcium, Ion: 1.27 mmol/L (ref 1.15–1.40)
Calcium, Ion: 0.89 mmol/L — CL (ref 1.15–1.40)
Calcium, Ion: 0.96 mmol/L — ABNORMAL LOW (ref 1.15–1.40)
Calcium, Ion: 0.95 mmol/L — ABNORMAL LOW (ref 1.15–1.40)
Calcium, Ion: 0.94 mmol/L — ABNORMAL LOW (ref 1.15–1.40)
Calcium, Ion: 0.96 mmol/L — ABNORMAL LOW (ref 1.15–1.40)
Calcium, Ion: 1.01 mmol/L — ABNORMAL LOW (ref 1.15–1.40)
Calcium, Ion: 1.07 mmol/L — ABNORMAL LOW (ref 1.15–1.40)
HCT: 38 % — ABNORMAL LOW (ref 39.0–52.0)
HCT: 26 % — ABNORMAL LOW (ref 39.0–52.0)
HCT: 25 % — ABNORMAL LOW (ref 39.0–52.0)
HCT: 23 % — ABNORMAL LOW (ref 39.0–52.0)
HCT: 22 % — ABNORMAL LOW (ref 39.0–52.0)
HCT: 23 % — ABNORMAL LOW (ref 39.0–52.0)
HCT: 24 % — ABNORMAL LOW (ref 39.0–52.0)
HCT: 20 % — ABNORMAL LOW (ref 39.0–52.0)
Hemoglobin: 12.9 g/dL — ABNORMAL LOW (ref 13.0–17.0)
Hemoglobin: 8.8 g/dL — ABNORMAL LOW (ref 13.0–17.0)
Hemoglobin: 8.5 g/dL — ABNORMAL LOW (ref 13.0–17.0)
Hemoglobin: 7.8 g/dL — ABNORMAL LOW (ref 13.0–17.0)
Hemoglobin: 7.5 g/dL — ABNORMAL LOW (ref 13.0–17.0)
Hemoglobin: 7.8 g/dL — ABNORMAL LOW (ref 13.0–17.0)
Hemoglobin: 8.2 g/dL — ABNORMAL LOW (ref 13.0–17.0)
Hemoglobin: 6.8 g/dL — CL (ref 13.0–17.0)
O2 Saturation: 100 %
O2 Saturation: 100 %
O2 Saturation: 100 %
O2 Saturation: 100 %
O2 Saturation: 98 %
O2 Saturation: 99 %
O2 Saturation: 99 %
O2 Saturation: 100 %
Patient temperature: 35.6
Patient temperature: 36.1
Potassium: 4.5 mmol/L (ref 3.5–5.1)
Potassium: 5.4 mmol/L — ABNORMAL HIGH (ref 3.5–5.1)
Potassium: 5.1 mmol/L (ref 3.5–5.1)
Potassium: 4.5 mmol/L (ref 3.5–5.1)
Potassium: 3.5 mmol/L (ref 3.5–5.1)
Potassium: 3.6 mmol/L (ref 3.5–5.1)
Potassium: 3.8 mmol/L (ref 3.5–5.1)
Potassium: 4.6 mmol/L (ref 3.5–5.1)
Sodium: 138 mmol/L (ref 135–145)
Sodium: 137 mmol/L (ref 135–145)
Sodium: 135 mmol/L (ref 135–145)
Sodium: 136 mmol/L (ref 135–145)
Sodium: 138 mmol/L (ref 135–145)
Sodium: 139 mmol/L (ref 135–145)
Sodium: 138 mmol/L (ref 135–145)
Sodium: 135 mmol/L (ref 135–145)
TCO2: 31 mmol/L (ref 22–32)
TCO2: 29 mmol/L (ref 22–32)
TCO2: 27 mmol/L (ref 22–32)
TCO2: 27 mmol/L (ref 22–32)
TCO2: 25 mmol/L (ref 22–32)
TCO2: 25 mmol/L (ref 22–32)
TCO2: 27 mmol/L (ref 22–32)
TCO2: 27 mmol/L (ref 22–32)
pCO2 arterial: 52.9 mmHg — ABNORMAL HIGH (ref 32–48)
pCO2 arterial: 40 mmHg (ref 32–48)
pCO2 arterial: 37.9 mmHg (ref 32–48)
pCO2 arterial: 38 mmHg (ref 32–48)
pCO2 arterial: 38.4 mmHg (ref 32–48)
pCO2 arterial: 40.6 mmHg (ref 32–48)
pCO2 arterial: 43.5 mmHg (ref 32–48)
pCO2 arterial: 46.9 mmHg (ref 32–48)
pH, Arterial: 7.346 — ABNORMAL LOW (ref 7.35–7.45)
pH, Arterial: 7.452 — ABNORMAL HIGH (ref 7.35–7.45)
pH, Arterial: 7.441 (ref 7.35–7.45)
pH, Arterial: 7.443 (ref 7.35–7.45)
pH, Arterial: 7.375 (ref 7.35–7.45)
pH, Arterial: 7.402 (ref 7.35–7.45)
pH, Arterial: 7.368 (ref 7.35–7.45)
pH, Arterial: 7.348 — ABNORMAL LOW (ref 7.35–7.45)
pO2, Arterial: 570 mmHg — ABNORMAL HIGH (ref 83–108)
pO2, Arterial: 432 mmHg — ABNORMAL HIGH (ref 83–108)
pO2, Arterial: 337 mmHg — ABNORMAL HIGH (ref 83–108)
pO2, Arterial: 391 mmHg — ABNORMAL HIGH (ref 83–108)
pO2, Arterial: 105 mmHg (ref 83–108)
pO2, Arterial: 131 mmHg — ABNORMAL HIGH (ref 83–108)
pO2, Arterial: 150 mmHg — ABNORMAL HIGH (ref 83–108)
pO2, Arterial: 188 mmHg — ABNORMAL HIGH (ref 83–108)

## 2022-04-12 LAB — CBC
HCT: 23.6 % — ABNORMAL LOW (ref 39.0–52.0)
HCT: 22.2 % — ABNORMAL LOW (ref 39.0–52.0)
HCT: 21.2 % — ABNORMAL LOW (ref 39.0–52.0)
Hemoglobin: 8.1 g/dL — ABNORMAL LOW (ref 13.0–17.0)
Hemoglobin: 8 g/dL — ABNORMAL LOW (ref 13.0–17.0)
Hemoglobin: 7.3 g/dL — ABNORMAL LOW (ref 13.0–17.0)
MCH: 29.5 pg (ref 26.0–34.0)
MCH: 30.7 pg (ref 26.0–34.0)
MCH: 29.3 pg (ref 26.0–34.0)
MCHC: 34.3 g/dL (ref 30.0–36.0)
MCHC: 36 g/dL (ref 30.0–36.0)
MCHC: 34.4 g/dL (ref 30.0–36.0)
MCV: 85.8 fL (ref 80.0–100.0)
MCV: 85.1 fL (ref 80.0–100.0)
MCV: 85.1 fL (ref 80.0–100.0)
Platelets: 92 10*3/uL — ABNORMAL LOW (ref 150–400)
Platelets: 100 10*3/uL — ABNORMAL LOW (ref 150–400)
Platelets: 129 10*3/uL — ABNORMAL LOW (ref 150–400)
RBC: 2.75 MIL/uL — ABNORMAL LOW (ref 4.22–5.81)
RBC: 2.61 MIL/uL — ABNORMAL LOW (ref 4.22–5.81)
RBC: 2.49 MIL/uL — ABNORMAL LOW (ref 4.22–5.81)
RDW: 13.3 % (ref 11.5–15.5)
RDW: 13.5 % (ref 11.5–15.5)
RDW: 13.7 % (ref 11.5–15.5)
WBC: 7.5 10*3/uL (ref 4.0–10.5)
WBC: 6.4 10*3/uL (ref 4.0–10.5)
WBC: 5.6 10*3/uL (ref 4.0–10.5)
nRBC: 0 % (ref 0.0–0.2)
nRBC: 0 % (ref 0.0–0.2)
nRBC: 0 % (ref 0.0–0.2)

## 2022-04-12 LAB — APTT: aPTT: 28 seconds (ref 24–36)

## 2022-04-12 LAB — POCT I-STAT EG7
Acid-Base Excess: 2 mmol/L (ref 0.0–2.0)
Bicarbonate: 26.3 mmol/L (ref 20.0–28.0)
Calcium, Ion: 0.97 mmol/L — ABNORMAL LOW (ref 1.15–1.40)
HCT: 26 % — ABNORMAL LOW (ref 39.0–52.0)
Hemoglobin: 8.8 g/dL — ABNORMAL LOW (ref 13.0–17.0)
O2 Saturation: 78 %
Potassium: 5.3 mmol/L — ABNORMAL HIGH (ref 3.5–5.1)
Sodium: 137 mmol/L (ref 135–145)
TCO2: 28 mmol/L (ref 22–32)
pCO2, Ven: 41.2 mmHg — ABNORMAL LOW (ref 44–60)
pH, Ven: 7.413 (ref 7.25–7.43)
pO2, Ven: 42 mmHg (ref 32–45)

## 2022-04-12 LAB — BASIC METABOLIC PANEL
Anion gap: 7 (ref 5–15)
Anion gap: 5 (ref 5–15)
BUN: 10 mg/dL (ref 6–20)
BUN: 7 mg/dL (ref 6–20)
CO2: 25 mmol/L (ref 22–32)
CO2: 24 mmol/L (ref 22–32)
Calcium: 9.5 mg/dL (ref 8.9–10.3)
Calcium: 6.9 mg/dL — ABNORMAL LOW (ref 8.9–10.3)
Chloride: 105 mmol/L (ref 98–111)
Chloride: 105 mmol/L (ref 98–111)
Creatinine, Ser: 0.94 mg/dL (ref 0.61–1.24)
Creatinine, Ser: 0.81 mg/dL (ref 0.61–1.24)
GFR, Estimated: >60 mL/min (ref >60)
GFR, Estimated: >60 mL/min (ref >60)
Glucose, Bld: 113 mg/dL — ABNORMAL HIGH (ref 70–99)
Glucose, Bld: 166 mg/dL — ABNORMAL HIGH (ref 70–99)
Potassium: 4.1 mmol/L (ref 3.5–5.1)
Potassium: 4.5 mmol/L (ref 3.5–5.1)
Sodium: 137 mmol/L (ref 135–145)
Sodium: 134 mmol/L — ABNORMAL LOW (ref 135–145)

## 2022-04-12 LAB — POCT I-STAT, CHEM 8
BUN: 10 mg/dL (ref 6–20)
BUN: 10 mg/dL (ref 6–20)
BUN: 9 mg/dL (ref 6–20)
BUN: 8 mg/dL (ref 6–20)
BUN: 8 mg/dL (ref 6–20)
Calcium, Ion: 1.24 mmol/L (ref 1.15–1.40)
Calcium, Ion: 1.16 mmol/L (ref 1.15–1.40)
Calcium, Ion: 0.93 mmol/L — ABNORMAL LOW (ref 1.15–1.40)
Calcium, Ion: 0.93 mmol/L — ABNORMAL LOW (ref 1.15–1.40)
Calcium, Ion: 0.9 mmol/L — ABNORMAL LOW (ref 1.15–1.40)
Chloride: 100 mmol/L (ref 98–111)
Chloride: 102 mmol/L (ref 98–111)
Chloride: 97 mmol/L — ABNORMAL LOW (ref 98–111)
Chloride: 100 mmol/L (ref 98–111)
Chloride: 99 mmol/L (ref 98–111)
Creatinine, Ser: 0.7 mg/dL (ref 0.61–1.24)
Creatinine, Ser: 0.7 mg/dL (ref 0.61–1.24)
Creatinine, Ser: 0.6 mg/dL — ABNORMAL LOW (ref 0.61–1.24)
Creatinine, Ser: 0.6 mg/dL — ABNORMAL LOW (ref 0.61–1.24)
Creatinine, Ser: 0.6 mg/dL — ABNORMAL LOW (ref 0.61–1.24)
Glucose, Bld: 102 mg/dL — ABNORMAL HIGH (ref 70–99)
Glucose, Bld: 113 mg/dL — ABNORMAL HIGH (ref 70–99)
Glucose, Bld: 110 mg/dL — ABNORMAL HIGH (ref 70–99)
Glucose, Bld: 133 mg/dL — ABNORMAL HIGH (ref 70–99)
Glucose, Bld: 158 mg/dL — ABNORMAL HIGH (ref 70–99)
HCT: 36 % — ABNORMAL LOW (ref 39.0–52.0)
HCT: 37 % — ABNORMAL LOW (ref 39.0–52.0)
HCT: 25 % — ABNORMAL LOW (ref 39.0–52.0)
HCT: 24 % — ABNORMAL LOW (ref 39.0–52.0)
HCT: 22 % — ABNORMAL LOW (ref 39.0–52.0)
Hemoglobin: 12.2 g/dL — ABNORMAL LOW (ref 13.0–17.0)
Hemoglobin: 12.6 g/dL — ABNORMAL LOW (ref 13.0–17.0)
Hemoglobin: 8.5 g/dL — ABNORMAL LOW (ref 13.0–17.0)
Hemoglobin: 8.2 g/dL — ABNORMAL LOW (ref 13.0–17.0)
Hemoglobin: 7.5 g/dL — ABNORMAL LOW (ref 13.0–17.0)
Potassium: 4.4 mmol/L (ref 3.5–5.1)
Potassium: 5.7 mmol/L — ABNORMAL HIGH (ref 3.5–5.1)
Potassium: 5.8 mmol/L — ABNORMAL HIGH (ref 3.5–5.1)
Potassium: 4.9 mmol/L (ref 3.5–5.1)
Potassium: 3.7 mmol/L (ref 3.5–5.1)
Sodium: 138 mmol/L (ref 135–145)
Sodium: 135 mmol/L (ref 135–145)
Sodium: 133 mmol/L — ABNORMAL LOW (ref 135–145)
Sodium: 135 mmol/L (ref 135–145)
Sodium: 137 mmol/L (ref 135–145)
TCO2: 32 mmol/L (ref 22–32)
TCO2: 27 mmol/L (ref 22–32)
TCO2: 26 mmol/L (ref 22–32)
TCO2: 26 mmol/L (ref 22–32)
TCO2: 23 mmol/L (ref 22–32)

## 2022-04-12 LAB — PROTIME-INR
INR: 1.6 — ABNORMAL HIGH (ref 0.8–1.2)
Prothrombin Time: 18.6 seconds — ABNORMAL HIGH (ref 11.4–15.2)

## 2022-04-12 LAB — BLOOD GAS, ARTERIAL
Acid-Base Excess: 3.9 mmol/L — ABNORMAL HIGH (ref 0.0–2.0)
Bicarbonate: 28.5 mmol/L — ABNORMAL HIGH (ref 20.0–28.0)
O2 Saturation: 99 %
Patient temperature: 36.4
pCO2 arterial: 41 mmHg (ref 32–48)
pH, Arterial: 7.45 (ref 7.35–7.45)
pO2, Arterial: 93 mmHg (ref 83–108)

## 2022-04-12 LAB — PREPARE RBC (CROSSMATCH)

## 2022-04-12 LAB — ECHO INTRAOPERATIVE TEE
AV Mean grad: 4 mmHg
Height: 68 in
MV Vena cont: 0.4 cm
Weight: 2588.8 oz

## 2022-04-12 LAB — MAGNESIUM
Magnesium: 2.2 mg/dL (ref 1.7–2.4)
Magnesium: 2.9 mg/dL — ABNORMAL HIGH (ref 1.7–2.4)

## 2022-04-12 LAB — GLUCOSE, CAPILLARY
Glucose-Capillary: 105 mg/dL — ABNORMAL HIGH (ref 70–99)
Glucose-Capillary: 146 mg/dL — ABNORMAL HIGH (ref 70–99)
Glucose-Capillary: 140 mg/dL — ABNORMAL HIGH (ref 70–99)
Glucose-Capillary: 148 mg/dL — ABNORMAL HIGH (ref 70–99)
Glucose-Capillary: 128 mg/dL — ABNORMAL HIGH (ref 70–99)
Glucose-Capillary: 134 mg/dL — ABNORMAL HIGH (ref 70–99)
Glucose-Capillary: 138 mg/dL — ABNORMAL HIGH (ref 70–99)
Glucose-Capillary: 168 mg/dL — ABNORMAL HIGH (ref 70–99)

## 2022-04-12 LAB — PLATELET COUNT: Platelets: 138 10*3/uL — ABNORMAL LOW (ref 150–400)

## 2022-04-12 LAB — FIBRINOGEN: Fibrinogen: 183 mg/dL — ABNORMAL LOW (ref 210–475)

## 2022-04-12 SURGERY — CORONARY ARTERY BYPASS GRAFTING (CABG)
Anesthesia: General | Site: Chest

## 2022-04-12 MED ORDER — MILRINONE LACTATE IN DEXTROSE 20-5 MG/100ML-% IV SOLN
0.2500 ug/kg/min | INTRAVENOUS | Status: DC
  Administered 2022-04-12: 0.25 ug/kg/min via INTRAVENOUS
  Filled 2022-04-12: qty 100

## 2022-04-12 MED ORDER — SODIUM CHLORIDE 0.9 % IV SOLN: 250.0000 mL | INTRAVENOUS | Status: DC

## 2022-04-12 MED ORDER — LACTATED RINGERS IV SOLN: INTRAVENOUS | Status: DC | PRN

## 2022-04-12 MED ORDER — CHLORHEXIDINE GLUCONATE CLOTH 2 % EX PADS
6.0000 | MEDICATED_PAD | Freq: Every day | CUTANEOUS | Status: DC
  Administered 2022-04-12 – 2022-04-15 (×5): 6 via TOPICAL

## 2022-04-12 MED ORDER — SODIUM CHLORIDE 0.9% FLUSH
3.0000 mL | Freq: Two times a day (BID) | INTRAVENOUS | Status: DC
  Administered 2022-04-13 – 2022-04-16 (×7): 3 mL via INTRAVENOUS

## 2022-04-12 MED ORDER — FAMOTIDINE IN NACL 20-0.9 MG/50ML-% IV SOLN
20.0000 mg | Freq: Two times a day (BID) | INTRAVENOUS | Status: AC
  Administered 2022-04-12 (×2): 20 mg via INTRAVENOUS
  Filled 2022-04-12 (×2): qty 50

## 2022-04-12 MED ORDER — ACETAMINOPHEN 500 MG PO TABS
1000.0000 mg | ORAL_TABLET | Freq: Four times a day (QID) | ORAL | Status: DC
  Administered 2022-04-13 – 2022-04-17 (×14): 1000 mg via ORAL
  Filled 2022-04-12 (×14): qty 2

## 2022-04-12 MED ORDER — NOREPINEPHRINE 4 MG/250ML-% IV SOLN
0.0000 ug/min | INTRAVENOUS | Status: DC
  Administered 2022-04-12: 7 ug/min via INTRAVENOUS
  Administered 2022-04-13: 8 ug/min via INTRAVENOUS
  Filled 2022-04-12 (×2): qty 250

## 2022-04-12 MED ORDER — MAGNESIUM SULFATE 4 GM/100ML IV SOLN
4.0000 g | Freq: Once | INTRAVENOUS | Status: AC
  Administered 2022-04-12: 4 g via INTRAVENOUS
  Filled 2022-04-12: qty 100

## 2022-04-12 MED ORDER — CHLORHEXIDINE GLUCONATE 0.12 % MT SOLN
15.0000 mL | OROMUCOSAL | Status: AC
  Administered 2022-04-12: 15 mL via OROMUCOSAL

## 2022-04-12 MED ORDER — SODIUM CHLORIDE 0.9% FLUSH: 3.0000 mL | INTRAVENOUS | Status: DC | PRN

## 2022-04-12 MED ORDER — TRAMADOL HCL 50 MG PO TABS
50.0000 mg | ORAL_TABLET | ORAL | Status: DC | PRN
  Administered 2022-04-13 (×2): 50 mg via ORAL
  Administered 2022-04-14: 100 mg via ORAL
  Filled 2022-04-12 (×2): qty 1
  Filled 2022-04-12: qty 2

## 2022-04-12 MED ORDER — LACTATED RINGERS IV SOLN: INTRAVENOUS | Status: DC

## 2022-04-12 MED ORDER — CALCIUM CHLORIDE 10 % IV SOLN
INTRAVENOUS | Status: DC | PRN
  Administered 2022-04-12: 100 mg via INTRAVENOUS

## 2022-04-12 MED ORDER — PLASMA-LYTE A IV SOLN
INTRAVENOUS | Status: DC | PRN
  Administered 2022-04-12: 500 mL via INTRAVASCULAR

## 2022-04-12 MED ORDER — INSULIN REGULAR(HUMAN) IN NACL 100-0.9 UT/100ML-% IV SOLN: INTRAVENOUS | Status: DC

## 2022-04-12 MED ORDER — SODIUM CHLORIDE 0.9 % IV SOLN: INTRAVENOUS | Status: DC

## 2022-04-12 MED ORDER — METOPROLOL TARTRATE 5 MG/5ML IV SOLN: 2.5000 mg | INTRAVENOUS | Status: DC | PRN

## 2022-04-12 MED ORDER — DEXMEDETOMIDINE HCL IN NACL 400 MCG/100ML IV SOLN
0.0000 ug/kg/h | INTRAVENOUS | Status: DC
  Administered 2022-04-12 (×2): 0.7 ug/kg/h via INTRAVENOUS
  Filled 2022-04-12 (×3): qty 100

## 2022-04-12 MED ORDER — DOCUSATE SODIUM 100 MG PO CAPS
200.0000 mg | ORAL_CAPSULE | Freq: Every day | ORAL | Status: DC
  Administered 2022-04-13 – 2022-04-15 (×3): 200 mg via ORAL
  Filled 2022-04-12 (×3): qty 2

## 2022-04-12 MED ORDER — PROTAMINE SULFATE 10 MG/ML IV SOLN
INTRAVENOUS | Status: DC | PRN
  Administered 2022-04-12: 260 mg via INTRAVENOUS

## 2022-04-12 MED ORDER — SODIUM CHLORIDE 0.9 % IV SOLN: INTRAVENOUS | Status: DC | PRN

## 2022-04-12 MED ORDER — ACETAMINOPHEN 160 MG/5ML PO SOLN: 1000.0000 mg | Freq: Four times a day (QID) | ORAL | Status: DC

## 2022-04-12 MED ORDER — ONDANSETRON HCL 4 MG/2ML IJ SOLN
4.0000 mg | Freq: Four times a day (QID) | INTRAMUSCULAR | Status: DC | PRN
  Administered 2022-04-12 – 2022-04-13 (×2): 4 mg via INTRAVENOUS
  Filled 2022-04-12 (×2): qty 2

## 2022-04-12 MED ORDER — ALBUMIN HUMAN 5 % IV SOLN: INTRAVENOUS | Status: DC | PRN

## 2022-04-12 MED ORDER — SODIUM CHLORIDE (PF) 0.9 % IJ SOLN
OROMUCOSAL | Status: DC | PRN
  Administered 2022-04-12: 12 mL via TOPICAL

## 2022-04-12 MED ORDER — METOPROLOL TARTRATE 12.5 MG HALF TABLET: 12.5000 mg | ORAL_TABLET | Freq: Two times a day (BID) | ORAL | Status: DC

## 2022-04-12 MED ORDER — PROPOFOL 10 MG/ML IV BOLUS
INTRAVENOUS | Status: AC
  Filled 2022-04-12: qty 20

## 2022-04-12 MED ORDER — MIDAZOLAM HCL (PF) 10 MG/2ML IJ SOLN
INTRAMUSCULAR | Status: AC
  Filled 2022-04-12: qty 2

## 2022-04-12 MED ORDER — MIDAZOLAM HCL 2 MG/2ML IJ SOLN
2.0000 mg | INTRAMUSCULAR | Status: DC | PRN
  Administered 2022-04-12 – 2022-04-13 (×6): 2 mg via INTRAVENOUS
  Filled 2022-04-12 (×6): qty 2

## 2022-04-12 MED ORDER — MIDAZOLAM HCL (PF) 5 MG/ML IJ SOLN
INTRAMUSCULAR | Status: DC | PRN
  Administered 2022-04-12 (×2): 2 mg via INTRAVENOUS
  Administered 2022-04-12: 3 mg via INTRAVENOUS
  Administered 2022-04-12: 1 mg via INTRAVENOUS
  Administered 2022-04-12: 2 mg via INTRAVENOUS

## 2022-04-12 MED ORDER — CEFAZOLIN SODIUM-DEXTROSE 2-4 GM/100ML-% IV SOLN
2.0000 g | Freq: Three times a day (TID) | INTRAVENOUS | Status: AC
  Administered 2022-04-12 – 2022-04-14 (×6): 2 g via INTRAVENOUS
  Filled 2022-04-12 (×6): qty 100

## 2022-04-12 MED ORDER — BISACODYL 5 MG PO TBEC
10.0000 mg | DELAYED_RELEASE_TABLET | Freq: Every day | ORAL | Status: DC
  Administered 2022-04-13 – 2022-04-15 (×3): 10 mg via ORAL
  Filled 2022-04-12 (×3): qty 2

## 2022-04-12 MED ORDER — PHENYLEPHRINE 80 MCG/ML (10ML) SYRINGE FOR IV PUSH (FOR BLOOD PRESSURE SUPPORT)
PREFILLED_SYRINGE | INTRAVENOUS | Status: DC | PRN
  Administered 2022-04-12: 160 ug via INTRAVENOUS

## 2022-04-12 MED ORDER — ASPIRIN 325 MG PO TBEC
325.0000 mg | DELAYED_RELEASE_TABLET | Freq: Every day | ORAL | Status: DC
  Administered 2022-04-13 – 2022-04-16 (×4): 325 mg via ORAL
  Filled 2022-04-12 (×4): qty 1

## 2022-04-12 MED ORDER — POTASSIUM CHLORIDE 10 MEQ/50ML IV SOLN
10.0000 meq | INTRAVENOUS | Status: AC
  Administered 2022-04-12 (×3): 10 meq via INTRAVENOUS

## 2022-04-12 MED ORDER — FENTANYL CITRATE (PF) 250 MCG/5ML IJ SOLN
INTRAMUSCULAR | Status: AC
  Filled 2022-04-12: qty 5

## 2022-04-12 MED ORDER — DEXTROSE 50 % IV SOLN: 0.0000 mL | INTRAVENOUS | Status: DC | PRN

## 2022-04-12 MED ORDER — ROCURONIUM BROMIDE 10 MG/ML (PF) SYRINGE
PREFILLED_SYRINGE | INTRAVENOUS | Status: DC | PRN
  Administered 2022-04-12: 100 mg via INTRAVENOUS
  Administered 2022-04-12 (×5): 50 mg via INTRAVENOUS

## 2022-04-12 MED ORDER — SODIUM CHLORIDE 0.9% IV SOLUTION: Freq: Once | INTRAVENOUS | Status: DC

## 2022-04-12 MED ORDER — FENTANYL CITRATE (PF) 250 MCG/5ML IJ SOLN
INTRAMUSCULAR | Status: DC | PRN
  Administered 2022-04-12: 100 ug via INTRAVENOUS
  Administered 2022-04-12 (×2): 150 ug via INTRAVENOUS
  Administered 2022-04-12: 50 ug via INTRAVENOUS
  Administered 2022-04-12: 100 ug via INTRAVENOUS
  Administered 2022-04-12: 300 ug via INTRAVENOUS
  Administered 2022-04-12: 150 ug via INTRAVENOUS
  Administered 2022-04-12: 100 ug via INTRAVENOUS

## 2022-04-12 MED ORDER — HEMOSTATIC AGENTS (NO CHARGE) OPTIME
TOPICAL | Status: DC | PRN
  Administered 2022-04-12: 1 via TOPICAL

## 2022-04-12 MED ORDER — LACTATED RINGERS IV SOLN: 500.0000 mL | Freq: Once | INTRAVENOUS | Status: DC | PRN

## 2022-04-12 MED ORDER — SODIUM CHLORIDE 0.9 % IV SOLN
20.0000 ug | Freq: Once | INTRAVENOUS | Status: DC
  Filled 2022-04-12: qty 5

## 2022-04-12 MED ORDER — PHENYLEPHRINE HCL-NACL 20-0.9 MG/250ML-% IV SOLN: 0.0000 ug/min | INTRAVENOUS | Status: DC

## 2022-04-12 MED ORDER — VANCOMYCIN HCL IN DEXTROSE 1-5 GM/200ML-% IV SOLN
1000.0000 mg | Freq: Once | INTRAVENOUS | Status: AC
  Administered 2022-04-12: 1000 mg via INTRAVENOUS
  Filled 2022-04-12: qty 200

## 2022-04-12 MED ORDER — ISOSORBIDE MONONITRATE ER 30 MG PO TB24
15.0000 mg | ORAL_TABLET | Freq: Every day | ORAL | Status: DC
  Administered 2022-04-13 – 2022-04-17 (×5): 15 mg via ORAL
  Filled 2022-04-12 (×5): qty 1

## 2022-04-12 MED ORDER — ALBUMIN HUMAN 5 % IV SOLN
250.0000 mL | INTRAVENOUS | Status: DC | PRN
  Administered 2022-04-12 – 2022-04-13 (×3): 12.5 g via INTRAVENOUS
  Filled 2022-04-12: qty 250

## 2022-04-12 MED ORDER — 0.9 % SODIUM CHLORIDE (POUR BTL) OPTIME
TOPICAL | Status: DC | PRN
  Administered 2022-04-12: 5000 mL

## 2022-04-12 MED ORDER — NITROGLYCERIN IN D5W 200-5 MCG/ML-% IV SOLN
7.0000 ug/min | INTRAVENOUS | Status: DC
  Administered 2022-04-12: 7 ug/min via INTRAVENOUS

## 2022-04-12 MED ORDER — MORPHINE SULFATE (PF) 2 MG/ML IV SOLN
1.0000 mg | INTRAVENOUS | Status: DC | PRN
  Administered 2022-04-12 – 2022-04-13 (×3): 4 mg via INTRAVENOUS
  Administered 2022-04-13: 1 mg via INTRAVENOUS
  Administered 2022-04-13: 4 mg via INTRAVENOUS
  Filled 2022-04-12 (×4): qty 2
  Filled 2022-04-12: qty 1

## 2022-04-12 MED ORDER — METOCLOPRAMIDE HCL 5 MG/ML IJ SOLN
10.0000 mg | Freq: Four times a day (QID) | INTRAMUSCULAR | Status: DC
  Administered 2022-04-12 – 2022-04-14 (×6): 10 mg via INTRAVENOUS
  Filled 2022-04-12 (×6): qty 2

## 2022-04-12 MED ORDER — ASPIRIN 81 MG PO CHEW: 324.0000 mg | CHEWABLE_TABLET | Freq: Every day | ORAL | Status: DC

## 2022-04-12 MED ORDER — HEPARIN SODIUM (PORCINE) 1000 UNIT/ML IJ SOLN
INTRAMUSCULAR | Status: DC | PRN
  Administered 2022-04-12: 24000 [IU] via INTRAVENOUS
  Administered 2022-04-12: 2000 [IU] via INTRAVENOUS

## 2022-04-12 MED ORDER — ACETAMINOPHEN 650 MG RE SUPP
650.0000 mg | Freq: Once | RECTAL | Status: AC
  Administered 2022-04-12: 650 mg via RECTAL

## 2022-04-12 MED ORDER — ACETAMINOPHEN 160 MG/5ML PO SOLN: 650.0000 mg | Freq: Once | ORAL | Status: AC

## 2022-04-12 MED ORDER — SURGIFLO WITH THROMBIN (HEMOSTATIC MATRIX KIT) OPTIME
TOPICAL | Status: DC | PRN
  Administered 2022-04-12: 1 via TOPICAL

## 2022-04-12 MED ORDER — PANTOPRAZOLE SODIUM 40 MG PO TBEC
40.0000 mg | DELAYED_RELEASE_TABLET | Freq: Every day | ORAL | Status: DC
  Administered 2022-04-14 – 2022-04-17 (×4): 40 mg via ORAL
  Filled 2022-04-12 (×4): qty 1

## 2022-04-12 MED ORDER — METOPROLOL TARTRATE 25 MG/10 ML ORAL SUSPENSION: 12.5000 mg | Freq: Two times a day (BID) | ORAL | Status: DC

## 2022-04-12 MED ORDER — OXYCODONE HCL 5 MG PO TABS
5.0000 mg | ORAL_TABLET | ORAL | Status: DC | PRN
  Administered 2022-04-13 (×3): 5 mg via ORAL
  Administered 2022-04-14: 10 mg via ORAL
  Filled 2022-04-12 (×3): qty 1
  Filled 2022-04-12: qty 2

## 2022-04-12 MED ORDER — SODIUM CHLORIDE 0.45 % IV SOLN
INTRAVENOUS | Status: DC | PRN
Start: 2022-04-12 — End: 2022-04-17

## 2022-04-12 MED ORDER — PROPOFOL 10 MG/ML IV BOLUS
INTRAVENOUS | Status: DC | PRN
  Administered 2022-04-12: 50 mg via INTRAVENOUS
  Administered 2022-04-12: 30 mg via INTRAVENOUS

## 2022-04-12 MED ORDER — BISACODYL 10 MG RE SUPP: 10.0000 mg | Freq: Every day | RECTAL | Status: DC

## 2022-04-12 MED ORDER — KETOROLAC TROMETHAMINE 15 MG/ML IJ SOLN
15.0000 mg | Freq: Four times a day (QID) | INTRAMUSCULAR | Status: DC
  Administered 2022-04-12 – 2022-04-14 (×8): 15 mg via INTRAVENOUS
  Filled 2022-04-12 (×8): qty 1

## 2022-04-12 SURGICAL SUPPLY — 113 items
ADAPTER CARDIO PERF ANTE/RETRO (ADAPTER) ×3 IMPLANT
APPLIER CLIP 9.375 SM OPEN (CLIP) ×3
BAG DECANTER FOR FLEXI CONT (MISCELLANEOUS) ×3 IMPLANT
BLADE CLIPPER SURG (BLADE) ×3 IMPLANT
BLADE STERNUM SYSTEM 6 (BLADE) ×3 IMPLANT
BLADE SURG 12 STRL SS (BLADE) ×3 IMPLANT
BLADE SURG 15 STRL LF DISP TIS (BLADE) ×2 IMPLANT
BLADE SURG 15 STRL SS (BLADE) ×3
BNDG ELASTIC 4X5.8 VLCR STR LF (GAUZE/BANDAGES/DRESSINGS) ×5 IMPLANT
BNDG ELASTIC 6X5.8 VLCR STR LF (GAUZE/BANDAGES/DRESSINGS) ×4 IMPLANT
BNDG GAUZE ELAST 4 BULKY (GAUZE/BANDAGES/DRESSINGS) ×5 IMPLANT
CANISTER SUCT 3000ML PPV (MISCELLANEOUS) ×3 IMPLANT
CANNULA GUNDRY RCSP 15FR (MISCELLANEOUS) ×3 IMPLANT
CANNULA VESSEL 3MM 2 BLNT TIP (CANNULA) ×1 IMPLANT
CATH CPB KIT VANTRIGT (MISCELLANEOUS) ×3 IMPLANT
CATH ROBINSON RED A/P 18FR (CATHETERS) ×9 IMPLANT
CATH THORACIC 28FR (CATHETERS) ×1 IMPLANT
CATH THORACIC 28FR RT ANG (CATHETERS) ×4 IMPLANT
CLIP APPLIE 9.375 SM OPEN (CLIP) ×2 IMPLANT
CLIP VESOCCLUDE MED 24/CT (CLIP) IMPLANT
CLIP VESOCCLUDE SM WIDE 24/CT (CLIP) IMPLANT
CNTNR URN SCR LID CUP LEK RST (MISCELLANEOUS) IMPLANT
CONT SPEC 4OZ STRL OR WHT (MISCELLANEOUS) ×3
CONTAINER PROTECT SURGISLUSH (MISCELLANEOUS) ×6 IMPLANT
COVER MAYO STAND STRL (DRAPES) ×3 IMPLANT
CUFF TOURN SGL QUICK 18X4 (TOURNIQUET CUFF) IMPLANT
CUFF TOURN SGL QUICK 24 (TOURNIQUET CUFF)
CUFF TRNQT CYL 24X4X16.5-23 (TOURNIQUET CUFF) IMPLANT
DERMABOND ADVANCED (GAUZE/BANDAGES/DRESSINGS) ×3
DERMABOND ADVANCED .7 DNX12 (GAUZE/BANDAGES/DRESSINGS) IMPLANT
DRAIN CHANNEL 32F RND 10.7 FF (WOUND CARE) ×3 IMPLANT
DRAPE CARDIOVASCULAR INCISE (DRAPES) ×3
DRAPE EXTREMITY T 121X128X90 (DISPOSABLE) ×3 IMPLANT
DRAPE HALF SHEET 40X57 (DRAPES) ×3 IMPLANT
DRAPE SLUSH/WARMER DISC (DRAPES) ×3 IMPLANT
DRAPE SRG 135X102X78XABS (DRAPES) ×2 IMPLANT
DRSG AQUACEL AG ADV 3.5X14 (GAUZE/BANDAGES/DRESSINGS) ×3 IMPLANT
ELECT BLADE 4.0 EZ CLEAN MEGAD (MISCELLANEOUS) ×3
ELECT BLADE 6.5 EXT (BLADE) ×3 IMPLANT
ELECT CAUTERY BLADE 6.4 (BLADE) ×3 IMPLANT
ELECT REM PT RETURN 9FT ADLT (ELECTROSURGICAL) ×6
ELECTRODE BLDE 4.0 EZ CLN MEGD (MISCELLANEOUS) ×2 IMPLANT
ELECTRODE REM PT RTRN 9FT ADLT (ELECTROSURGICAL) ×4 IMPLANT
FELT TEFLON 1X6 (MISCELLANEOUS) ×6 IMPLANT
GAUZE 4X4 16PLY ~~LOC~~+RFID DBL (SPONGE) ×3 IMPLANT
GAUZE SPONGE 4X4 12PLY STRL (GAUZE/BANDAGES/DRESSINGS) ×6 IMPLANT
GAUZE SPONGE 4X4 12PLY STRL LF (GAUZE/BANDAGES/DRESSINGS) ×4 IMPLANT
GEL ULTRASOUND 20GR AQUASONIC (MISCELLANEOUS) ×3 IMPLANT
GLOVE BIO SURGEON STRL SZ7.5 (GLOVE) ×9 IMPLANT
GOWN STRL REUS W/ TWL LRG LVL3 (GOWN DISPOSABLE) ×8 IMPLANT
GOWN STRL REUS W/TWL LRG LVL3 (GOWN DISPOSABLE) ×12
HEMOSTAT POWDER SURGIFOAM 1G (HEMOSTASIS) ×9 IMPLANT
HEMOSTAT SURGICEL 2X14 (HEMOSTASIS) ×3 IMPLANT
INSERT FOGARTY XLG (MISCELLANEOUS) IMPLANT
KIT BASIN OR (CUSTOM PROCEDURE TRAY) ×3 IMPLANT
KIT SUCTION CATH 14FR (SUCTIONS) ×3 IMPLANT
KIT TURNOVER KIT B (KITS) ×3 IMPLANT
KIT VASOVIEW HEMOPRO 2 VH 4000 (KITS) ×3 IMPLANT
LEAD PACING MYOCARDI (MISCELLANEOUS) ×3 IMPLANT
MARKER GRAFT CORONARY BYPASS (MISCELLANEOUS) ×9 IMPLANT
NS IRRIG 1000ML POUR BTL (IV SOLUTION) ×15 IMPLANT
PACK E OPEN HEART (SUTURE) ×3 IMPLANT
PACK OPEN HEART (CUSTOM PROCEDURE TRAY) ×3 IMPLANT
PAD ARMBOARD 7.5X6 YLW CONV (MISCELLANEOUS) ×6 IMPLANT
PAD ELECT DEFIB RADIOL ZOLL (MISCELLANEOUS) ×3 IMPLANT
PENCIL BUTTON HOLSTER BLD 10FT (ELECTRODE) ×3 IMPLANT
POSITIONER HEAD DONUT 9IN (MISCELLANEOUS) ×3 IMPLANT
POWDER SURGICEL 3.0 GRAM (HEMOSTASIS) ×3 IMPLANT
PUNCH AORTIC ROTATE 4.0MM (MISCELLANEOUS) IMPLANT
PUNCH AORTIC ROTATE 4.5MM 8IN (MISCELLANEOUS) ×1 IMPLANT
PUNCH AORTIC ROTATE 5MM 8IN (MISCELLANEOUS) IMPLANT
SET MPS 3-ND DEL (MISCELLANEOUS) ×1 IMPLANT
SHEARS HARMONIC 9CM CVD (BLADE) ×3 IMPLANT
SPONGE T-LAP 18X18 ~~LOC~~+RFID (SPONGE) ×13 IMPLANT
SPONGE T-LAP 4X18 ~~LOC~~+RFID (SPONGE) ×3 IMPLANT
STAPLER VISISTAT 35W (STAPLE) ×1 IMPLANT
SUPPORT HEART JANKE-BARRON (MISCELLANEOUS) ×3 IMPLANT
SURGIFLO W/THROMBIN 8M KIT (HEMOSTASIS) ×3 IMPLANT
SUT BONE WAX W31G (SUTURE) ×3 IMPLANT
SUT MNCRL AB 4-0 PS2 18 (SUTURE) ×3 IMPLANT
SUT PROLENE 3 0 SH DA (SUTURE) ×2 IMPLANT
SUT PROLENE 3 0 SH1 36 (SUTURE) IMPLANT
SUT PROLENE 4 0 RB 1 (SUTURE) ×3
SUT PROLENE 4 0 SH DA (SUTURE) ×3 IMPLANT
SUT PROLENE 4-0 RB1 .5 CRCL 36 (SUTURE) ×2 IMPLANT
SUT PROLENE 5 0 C 1 36 (SUTURE) ×1 IMPLANT
SUT PROLENE 6 0 C 1 30 (SUTURE) ×2 IMPLANT
SUT PROLENE 6 0 CC (SUTURE) ×9 IMPLANT
SUT PROLENE 8 0 BV175 6 (SUTURE) ×5 IMPLANT
SUT PROLENE BLUE 7 0 (SUTURE) ×5 IMPLANT
SUT PROLENE POLY MONO (SUTURE) ×1 IMPLANT
SUT SILK  1 MH (SUTURE) ×3
SUT SILK 1 MH (SUTURE) IMPLANT
SUT SILK 2 0 SH CR/8 (SUTURE) ×1 IMPLANT
SUT SILK 3 0 SH CR/8 (SUTURE) IMPLANT
SUT STEEL 6MS V (SUTURE) ×5 IMPLANT
SUT STEEL SZ 6 DBL 3X14 BALL (SUTURE) ×4 IMPLANT
SUT VIC AB 1 CTX 36 (SUTURE) ×6
SUT VIC AB 1 CTX36XBRD ANBCTR (SUTURE) ×4 IMPLANT
SUT VIC AB 2-0 CT1 27 (SUTURE) ×12
SUT VIC AB 2-0 CT1 TAPERPNT 27 (SUTURE) IMPLANT
SUT VIC AB 2-0 CTX 27 (SUTURE) IMPLANT
SUT VIC AB 3-0 SH 27 (SUTURE)
SUT VIC AB 3-0 SH 27X BRD (SUTURE) IMPLANT
SUT VIC AB 3-0 X1 27 (SUTURE) IMPLANT
SYR 50ML SLIP (SYRINGE) IMPLANT
SYSTEM SAHARA CHEST DRAIN ATS (WOUND CARE) ×3 IMPLANT
TOWEL GREEN STERILE (TOWEL DISPOSABLE) ×3 IMPLANT
TOWEL GREEN STERILE FF (TOWEL DISPOSABLE) ×3 IMPLANT
TRAY FOLEY SLVR 16FR TEMP STAT (SET/KITS/TRAYS/PACK) ×3 IMPLANT
TUBING LAP HI FLOW INSUFFLATIO (TUBING) ×3 IMPLANT
UNDERPAD 30X36 HEAVY ABSORB (UNDERPADS AND DIAPERS) ×3 IMPLANT
WATER STERILE IRR 1000ML POUR (IV SOLUTION) ×6 IMPLANT

## 2022-04-12 NOTE — Progress Notes (Signed)
Patient ID: Austin Vincent, male   DOB: 1971-03-02, 51 y.o.   MRN: [id number]   TCTS Evening Rounds:   Hemodynamically stable  CI = 3.3 on milrinone 0.25 and NE 10.  Remains sedated on vent.  Urine output good  CT output low now. Had received some FFP and cryo.  CBC    Component Value Date/Time   WBC 7.5 04/12/2022 1528   RBC 2.75 (L) 04/12/2022 1528   HGB 8.2 (L) 04/12/2022 1530   HGB 15.4 04/03/2022 1623   HCT 24.0 (L) 04/12/2022 1530   HCT 43.2 04/03/2022 1623   PLT 92 (L) 04/12/2022 1528   PLT 221 04/03/2022 1623   MCV 85.8 04/12/2022 1528   MCV 87 04/03/2022 1623   MCH 29.5 04/12/2022 1528   MCHC 34.3 04/12/2022 1528   RDW 13.3 04/12/2022 1528   RDW 12.7 04/03/2022 1623   LYMPHSABS 2.3 04/12/2022 0441   MONOABS 0.8 04/12/2022 0441   EOSABS 0.5 04/12/2022 0441   BASOSABS 0.0 04/12/2022 0441     BMET    Component Value Date/Time   NA 138 04/12/2022 1530   NA 142 04/03/2022 1623   K 3.8 04/12/2022 1530   CL 99 04/12/2022 1402   CO2 25 04/12/2022 0441   GLUCOSE 158 (H) 04/12/2022 1402   BUN 8 04/12/2022 1402   BUN 9 04/03/2022 1623   CREATININE 0.60 (L) 04/12/2022 1402   CALCIUM 9.5 04/12/2022 0441   EGFR 99 04/03/2022 1623   GFRNONAA >60 04/12/2022 0441     A/P:  Stable postop course. Continue current plans. Plan to keep intubated overnight per PVT.

## 2022-04-12 NOTE — Anesthesia Procedure Notes (Signed)
Central Venous Catheter Insertion Performed by: Nolon Nations, MD, anesthesiologist Start/End6/12/2021 6:50 AM, 04/12/2022 7:05 AM Patient location: Pre-op. Preanesthetic checklist: patient identified, IV checked, site marked, risks and benefits discussed, surgical consent, monitors and equipment checked, pre-op evaluation, timeout performed and anesthesia consent Position: Trendelenburg Lidocaine 1% used for infiltration and patient sedated Hand hygiene performed , maximum sterile barriers used  and Seldinger technique used Catheter size: 9 Fr MAC introducer Procedure performed using ultrasound guided technique. Ultrasound Notes:anatomy identified, needle tip was noted to be adjacent to the nerve/plexus identified, no ultrasound evidence of intravascular and/or intraneural injection and image(s) printed for medical record Attempts: 1 Following insertion, line sutured, dressing applied and Biopatch. Post procedure assessment: blood return through all ports, free fluid flow and no air  Patient tolerated the procedure well with no immediate complications.

## 2022-04-12 NOTE — Anesthesia Procedure Notes (Signed)
Central Venous Catheter Insertion Performed by: Nolon Nations, MD, anesthesiologist Start/End6/12/2021 7:05 AM, 04/12/2022 7:10 AM Patient location: Pre-op. Preanesthetic checklist: patient identified, IV checked, site marked, risks and benefits discussed, surgical consent, monitors and equipment checked, pre-op evaluation, timeout performed and anesthesia consent Position: Trendelenburg Hand hygiene performed  and maximum sterile barriers used  PA cath was placed.Swan type:thermodilution PA Cath depth:48 Procedure performed without using ultrasound guided technique. Attempts: 1 Post procedure assessment: free fluid flow and no air  Patient tolerated the procedure well with no immediate complications.

## 2022-04-12 NOTE — Anesthesia Procedure Notes (Addendum)
Procedure Name: Intubation Date/Time: 04/12/2022 7:52 AM Performed by: Inda Coke, CRNA Pre-anesthesia Checklist: Patient identified, Emergency Drugs available, Suction available and Patient being monitored Patient Re-evaluated:Patient Re-evaluated prior to induction Oxygen Delivery Method: Circle System Utilized Preoxygenation: Pre-oxygenation with 100% oxygen Induction Type: IV induction Ventilation: Mask ventilation without difficulty Laryngoscope Size: Mac and 4 Grade View: Grade II Tube type: Oral Tube size: 8.0 mm Number of attempts: 1 Airway Equipment and Method: Stylet and Oral airway Placement Confirmation: ETT inserted through vocal cords under direct vision, positive ETCO2 and breath sounds checked- equal and bilateral Secured at: 23 cm Tube secured with: Tape Dental Injury: Teeth and Oropharynx as per pre-operative assessment

## 2022-04-12 NOTE — Discharge Summary (Incomplete)
Physician Discharge Summary       [address]       [place] [address]             [phone]    Patient ID: Austin Vincent MRN: [id number] DOB/AGE: [birth year] 51 y.o.  Admit date: 04/06/2022 Discharge date: 04/17/2022  Admission Diagnoses: Syncope 2. Non-ST elevation (NSTEMI) myocardial infarction ([place]) 3. Coronary artery disease  Discharge Diagnoses:  S/p CABG x 4 Expected post op blood loss anemia 3. History of Hyperlipidemia 4. History of Essential hypertension 5. History of Medullary thyroid carcinoma ([place])   6. History of Pulmonary nodules 7. History of Nephrolithiasis 8. History of tobacco abuse   Consults: None  Procedure (s):  1.  Coronary artery bypass grafting x4 (left internal mammary artery to LAD, saphenous vein graft to posterior descending, left radial artery free graft to diagonal, saphenous vein graft to ramus intermediate). 2.  Harvest of left radial artery graft. 3.  Endoscopic harvest of right and left greater saphenous vein by Dr. Prescott Gum on 04/12/2022.  HPI: We are asked to see this 51 year old male in cardiothoracic surgical consultation for consideration of coronary artery surgical revascularization.  Patient has a long history of CAD having had his first event in 47 at age 51.  At that time he had an inferior myocardial infarction with stenting of the right coronary artery.  He was admitted in 2015 with a non-ST elevation myocardial infarction at which time was found to have an occluded LAD (chronic) and the RCA was also occluded.  Ejection fraction at that time was 45 to 55%.  He underwent PCI of the right coronary and PDA.    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  Patient does experience occasional anginal equivalents of throat tightness which is similar to his  previous infarct pain.  He also has elevation of his heart rate at times with exertion.  He presented to the emergency department on 04/06/2022 with a loss of consciousness.  The patient states he had not eaten during the day and while he was driving he was feeling very hungry and developed dizziness, lightheadedness, diaphoresis causing him to pull over to the side of the road.  He apparently lost consciousness and a bystander found him to be confused and he was noted to be incontinent of urine.  He denied chest pain or shortness of breath.  He has no previous history of seizure or syncope.  EKG noted mild ST elevation in V2 and V3.  Cardiology consultation was obtained.  High-sensitivity troponin has peaked at 2302.  He was admitted for further medical stabilization and diagnostic evaluation.  Echocardiogram has been performed and the full report as described below.  Left ventricular ejection fraction by estimation is 50 to 55%.  Cardiac catheterization shows severe three-vessel obstructive disease with mildly reduced LV function, and for apical hypokinesis and normal LVEDP. Dr. Prescott Gum discussed the need for coronary artery bypass grafting surgery. Potential risks, benefits, and complications of the surgery were discussed with the patient and he agreed to proceed with surgery. Pre operative carotid duplex US showed no significant internal carotid artery stenosis bilaterally.  Hospital Course: Patient underwent a CABG x 4. He was transferred from the OR to [facility] ICU in stable condition.  He remained hemodynamically stable on milrinone and norepinephrine.  He  was weaned from the ventilator per protocol and extubated on the morning of the first postoperative day.  Milrinone and norepinephrine were weaned off on the first postoperative day.  Chest tubes and monitoring lines were removed routinely.  He was mobilized.  Diuresis was begun for expected volume excess. He was transitioned off the Insulin drip. His pre op  HGA1C was 6.1. He likely has pre diabetes. Will need surveillance as an outpatient and will provide nutritional information with discharge paperwork.He had difficulty sleeping and was given Melatonin, which did not help much. He was requiring several liters of oxygen via [place] initially but was later weaned to room air. He had mild thrombocytopenia post op, but this later resolved.  Last platelet count was up to 144,000. He was surgically stable for transfer from the ICU to 4E for further convalescence on 06/05. EPW were removed on 06/06. He has some numbness left thumb (radial artery harvest) but motor and remainder of sensation intact. Sternal and LE wounds are continuing to heal and show no sign of infection. He has ecchymosis of both thighs, but are soft. He has a small wound inner right thigh that has been draining bloody like ooze. This should stop soon and there is no sign of infection. Chest tube sutures and LUE staples will be removed in the office after discharge. He is felt surgically stable for discharge today.    Latest Vital Signs: Blood pressure 132/78, pulse 85, temperature 98.5 F (36.9 C), temperature source Oral, resp. rate 20, height '5\' 8"'$  (1.727 m), weight 73.3 kg, SpO2 97 %.  Physical Exam: Cardiovascular: RRR Pulmonary: Slightly diminished bibasilar breath sounds Abdomen: Soft, non tender, bowel sounds present. Extremities: Mild bilateral lower extremity edema. Ecchymosis bilateral LEs. Motor/sensory intact Wounds: Clean and dry.  No erythema or signs of infection. Small wound inner right thigh with blood ooze.    Discharge Condition:Stable and discharged to home.  Recent laboratory studies:  Lab Results  Component Value Date   WBC 7.7 04/16/2022   HGB 7.9 (L) 04/16/2022   HCT 23.8 (L) 04/16/2022   MCV 87.2 04/16/2022   PLT 144 (L) 04/16/2022   Lab Results  Component Value Date   NA 135 04/16/2022   K 4.1 04/16/2022   CL 103 04/16/2022   CO2 27 04/16/2022    CREATININE 1.00 04/16/2022   GLUCOSE 82 04/16/2022      Diagnostic Studies: DG Chest 2 View  Result Date: 04/16/2022 CLINICAL DATA:  Post CABG EXAM: CHEST - 2 VIEW COMPARISON:  04/15/2022 FINDINGS: Enlargement of cardiac silhouette with pulmonary vascular congestion post CABG. Stable mediastinal contours. Scattered atelectasis similar to previous exam, greater in LEFT lower lobe. No infiltrate or pneumothorax. Tiny pleural effusions blunt the posterior costophrenic angles. IMPRESSION: Scattered atelectasis and tiny pleural effusions. Enlargement of cardiac silhouette post CABG. Electronically Signed   By: Lavonia Dana M.D.   On: 04/16/2022 08:21   DG Chest 2 View  Result Date: 04/10/2022 CLINICAL DATA:  Chest pain. EXAM: CHEST - 2 VIEW COMPARISON:  CTA chest, abdomen and pelvis 04/06/2022. FINDINGS: Irregular opacities previously seen in the right upper lobe appear to be less dense today consistent with a partially resolved pneumonic infiltrate most likely. The remaining lungs are generally clear. No pleural effusion is seen. There is no new or worsened opacity. The cardiac size is normal. Unremarkable mediastinal configuration. Thoracic cage is intact. IMPRESSION: Irregular right upper lobe opacities in the lateral apical area are less dense today most likely due to  partial resolution of an infectious process. No new abnormality. Electronically Signed   By: Telford Nab M.D.   On: 04/10/2022 06:32   CT Head Wo Contrast  Result Date: 04/06/2022 CLINICAL DATA:  Syncope/presyncope, cerebrovascular cause suspected EXAM: CT HEAD WITHOUT CONTRAST TECHNIQUE: Contiguous axial images were obtained from the base of the skull through the vertex without intravenous contrast. RADIATION DOSE REDUCTION: This exam was performed according to the departmental dose-optimization program which includes automated exposure control, adjustment of the mA and/or kV according to patient size and/or use of iterative  reconstruction technique. COMPARISON:  None Available. FINDINGS: Brain: No evidence of acute infarction, hemorrhage, hydrocephalus, extra-axial collection or mass lesion/mass effect. Vascular: No hyperdense vessel identified. Calcific intracranial atherosclerosis Skull: No acute fracture. Sinuses/Orbits: Clear sinuses.  No acute orbital findings. Other: No mastoid effusions. IMPRESSION: No evidence of acute intracranial abnormality. Electronically Signed   By: Margaretha Sheffield M.D.   On: 04/06/2022 16:48   CARDIAC CATHETERIZATION  Result Date: 04/09/2022   Mid LAD lesion is 100% stenosed.   1st Diag lesion is 90% stenosed.   1st Mrg lesion is 50% stenosed.   Mid Cx lesion is 90% stenosed.   2nd Mrg lesion is 90% stenosed.   Ost RCA to Mid RCA lesion is 95% stenosed.   Mid RCA to Dist RCA lesion is 30% stenosed.   There is mild left ventricular systolic dysfunction.   LV end diastolic pressure is normal.   The left ventricular ejection fraction is 50-55% by visual estimate. Severe 3 vessel obstructive CAD Mildly reduced LV function. Inferoapical HK Normal LVEDP Plan: Needs CT surgery consult for CABG.   DG CHEST PORT 1 VIEW  Result Date: 04/15/2022 CLINICAL DATA:  History of coronary stent and CABG. EXAM: PORTABLE CHEST 1 VIEW COMPARISON:  April 14, 2022 FINDINGS: The heart size and mediastinal contours are stable. Heart size is enlarged. No definite pleural line is identified in the right apex to suggest pneumothorax. Mild atelectasis of the left lung base is noted. Previously noted life supporting devices have been removed. The visualized skeletal structures are stable. IMPRESSION: No definite pleural line is identified in the right apex to suggest pneumothorax. Mild atelectasis of the left lung base. Electronically Signed   By: Abelardo Diesel M.D.   On: 04/15/2022 06:38   DG Chest Port 1 View  Result Date: 04/14/2022 CLINICAL DATA:  Evaluate chest tubes, pneumothorax post CABG. EXAM: PORTABLE CHEST 1 VIEW  COMPARISON:  Portable chest yesterday at 6:02 a.m. FINDINGS: 5:15 a.m. 04/14/2022. Interval removal Swan-Ganz catheter, ETT, NGT. Right IJ catheter introducer remains in place with tip at the brachiocephalic/SVC junction. At least 1, possibly 2 mediastinal drains remain in position and bilateral chest tubes are unchanged. Query minimal right apical pneumothorax new from the last film yesterday. If present this would only be approximally 3% or less volume. This is only a questionable finding. Cardiomegaly with CABG change. Sternotomy sutures remain midline. Central vessels are normal caliber. There is interval clearing in the left base except for a few atelectatic bands. Remaining lungs clear. No significant pleural effusion. Stable mediastinal configuration. IMPRESSION: 1. Questionable minimal interval new right apical pneumothorax, if present this would only be 3% or less volume. 2. No left pneumothorax.  Stable positioning of chest tubes. 3. Interval clearing in the left base except for a few atelectatic bands. 4. Interval removal Swan-Ganz catheter, ETT, NGT. 5. These results will be called to the ordering clinician or representative by the radiologist assistant, and communication  documented in the PACS or [employer]. Electronically Signed   By: Telford Nab M.D.   On: 04/14/2022 06:21   DG Chest Port 1 View  Result Date: 04/13/2022 CLINICAL DATA:  Evaluate for pneumothorax. EXAM: PORTABLE CHEST 1 VIEW COMPARISON:  Chest radiograph 04/12/2022 FINDINGS: Endotracheal tube is approximately 5.1 cm above the carina. Right jugular central line with pulmonary artery catheter that terminates in the proximal right pulmonary artery. Evidence for bilateral chest tubes that appear to be stable. Negative for pneumothorax. Persistent densities at the left lung base are most compatible with atelectasis. Heart size is stable. Post CABG changes. Faint densities in right upper lung are less conspicuous on today's  examination. Nasogastric tube extends into the abdomen and the tip appears to be near the gastric fundus. IMPRESSION: 1. Stable appearance of the support apparatuses. Negative for pneumothorax. 2. Persistent basilar chest densities particularly at the left lung base. Findings are most compatible with atelectasis. Electronically Signed   By: Markus Daft M.D.   On: 04/13/2022 08:37   DG Chest Port 1 View  Result Date: 04/12/2022 CLINICAL DATA:  Evaluate for pneumothorax EXAM: PORTABLE CHEST 1 VIEW COMPARISON:  04/10/2022 chest radiograph. FINDINGS: Right apical and left upper chest tubes in place. Endotracheal tube tip is 3.0 cm above the carina. Enteric tube enters the stomach with tip overlying the gastric fundus. Right internal jugular Swan-Ganz catheter terminates over the main pulmonary artery. Intact sternotomy wires. Lower mediastinal drain in place. Stable cardiomediastinal silhouette with normal heart size. No pneumothorax. No pleural effusion. Hazy bibasilar lung opacities with low lung volumes, favor atelectasis. Focal 2 cm nodular opacity in the upper right lung, as seen on 04/06/2022 chest CT angiogram study. IMPRESSION: 1. No pneumothorax. Well-positioned support structures. 2. Hazy bibasilar lung opacities with low lung volumes, favor atelectasis. 3. Focal 2 cm nodular opacity in the upper right lung, as seen on 04/06/2022 chest CT angiogram study. Please see report from 04/06/2022 chest CT angiogram study for follow-up recommendations. Electronically Signed   By: Ilona Sorrel M.D.   On: 04/12/2022 15:42   ECHOCARDIOGRAM COMPLETE  Result Date: 04/07/2022    ECHOCARDIOGRAM REPORT   Patient Name:   Austin Vincent [facility] Date of Exam: 04/07/2022 Medical Rec #:  [id number]           Height:       68.0 in Accession #:    [id number]          Weight:       165.2 lb Date of Birth:  [birth year]            BSA:          1.884 m Patient Age:    59 years            BP:           102/59 mmHg Patient Gender: M                    HR:           58 bpm. Exam Location:  Inpatient Procedure: 2D Echo, Cardiac Doppler and Color Doppler Indications:    Syncope  History:        Patient has prior history of Echocardiogram examinations, most                 recent 12/01/2013. CAD and Previous Myocardial Infarction,                 Signs/Symptoms:Syncope; Risk Factors:Dyslipidemia,  Hypertension                 and Current Smoker.  Sonographer:    Wenda Low Referring Phys: [id number] [place]  1. Can consider f/u contrast imaging to look at apex if clinically indicated.  2. Distal septal / apical hypokinesis Apex with calcified band/chords doubt thrombus. Left ventricular ejection fraction, by estimation, is 50 to 55%. The left ventricle has low normal function. The left ventricle demonstrates regional wall motion abnormalities (see scoring diagram/findings for description). Left ventricular diastolic parameters were normal.  3. Right ventricular systolic function is normal. The right ventricular size is normal.  4. Left atrial size was mildly dilated.  5. The mitral valve is abnormal. Trivial mitral valve regurgitation. No evidence of mitral stenosis.  6. The aortic valve is tricuspid. There is mild calcification of the aortic valve. Aortic valve regurgitation is not visualized. No aortic stenosis is present.  7. The inferior vena cava is normal in size with greater than 50% respiratory variability, suggesting right atrial pressure of 3 mmHg. FINDINGS  Left Ventricle: Distal septal / apical hypokinesis Apex with calcified band/chords doubt thrombus. Left ventricular ejection fraction, by estimation, is 50 to 55%. The left ventricle has low normal function. The left ventricle demonstrates regional wall  motion abnormalities. The left ventricular internal cavity size was normal in size. There is no left ventricular hypertrophy. Left ventricular diastolic parameters were normal. Right Ventricle: The right ventricular  size is normal. No increase in right ventricular wall thickness. Right ventricular systolic function is normal. Left Atrium: Left atrial size was mildly dilated. Right Atrium: Right atrial size was normal in size. Pericardium: There is no evidence of pericardial effusion. Mitral Valve: The mitral valve is abnormal. There is mild thickening of the mitral valve leaflet(s). Trivial mitral valve regurgitation. No evidence of mitral valve stenosis. MV peak gradient, 5.4 mmHg. The mean mitral valve gradient is 2.0 mmHg. Tricuspid Valve: The tricuspid valve is normal in structure. Tricuspid valve regurgitation is trivial. No evidence of tricuspid stenosis. Aortic Valve: The aortic valve is tricuspid. There is mild calcification of the aortic valve. Aortic valve regurgitation is not visualized. No aortic stenosis is present. Aortic valve mean gradient measures 4.0 mmHg. Aortic valve peak gradient measures 9.0 mmHg. Aortic valve area, by VTI measures 1.88 cm. Pulmonic Valve: The pulmonic valve was normal in structure. Pulmonic valve regurgitation is trivial. No evidence of pulmonic stenosis. Aorta: The aortic root is normal in size and structure. Venous: The inferior vena cava is normal in size with greater than 50% respiratory variability, suggesting right atrial pressure of 3 mmHg. IAS/Shunts: No atrial level shunt detected by color flow Doppler. Additional Comments: Can consider f/u contrast imaging to look at apex if clinically indicated.  LEFT VENTRICLE PLAX 2D LVIDd:         4.90 cm     Diastology LVIDs:         3.50 cm     LV e' medial:    7.51 cm/s LV PW:         1.00 cm     LV E/e' medial:  14.1 LV IVS:        1.10 cm     LV e' lateral:   12.90 cm/s LVOT diam:     1.90 cm     LV E/e' lateral: 8.2 LV SV:         66 LV SV Index:   35 LVOT Area:  2.84 cm  LV Volumes (MOD) LV vol d, MOD A2C: 76.2 ml LV vol d, MOD A4C: 74.8 ml LV vol s, MOD A2C: 31.6 ml LV vol s, MOD A4C: 34.8 ml LV SV MOD A2C:     44.6 ml LV SV MOD  A4C:     74.8 ml LV SV MOD BP:      43.0 ml RIGHT VENTRICLE RV Basal diam:  3.25 cm RV Mid diam:    2.80 cm RV S prime:     12.80 cm/s TAPSE (M-mode): 2.4 cm LEFT ATRIUM             Index        RIGHT ATRIUM           Index LA diam:        4.10 cm 2.18 cm/m   RA Area:     17.30 cm LA Vol (A2C):   50.5 ml 26.80 ml/m  RA Volume:   43.10 ml  22.87 ml/m LA Vol (A4C):   59.3 ml 31.47 ml/m LA Biplane Vol: 57.8 ml 30.68 ml/m  AORTIC VALVE                    PULMONIC VALVE AV Area (Vmax):    1.97 cm     PV Vmax:       0.90 m/s AV Area (Vmean):   1.87 cm     PV Peak grad:  3.2 mmHg AV Area (VTI):     1.88 cm AV Vmax:           150.00 cm/s AV Vmean:          95.700 cm/s AV VTI:            0.352 m AV Peak Grad:      9.0 mmHg AV Mean Grad:      4.0 mmHg LVOT Vmax:         104.00 cm/s LVOT Vmean:        63.100 cm/s LVOT VTI:          0.234 m LVOT/AV VTI ratio: 0.66  AORTA Ao Root diam: 3.50 cm Ao Asc diam:  3.00 cm MITRAL VALVE MV Area (PHT): 2.95 cm     SHUNTS MV Area VTI:   1.48 cm     Systemic VTI:  0.23 m MV Peak grad:  5.4 mmHg     Systemic Diam: 1.90 cm MV Mean grad:  2.0 mmHg MV Vmax:       1.16 m/s MV Vmean:      58.4 cm/s MV Decel Time: 257 msec MV E velocity: 106.00 cm/s MV A velocity: 82.70 cm/s MV E/A ratio:  1.28 Jenkins Rouge MD Electronically signed by Jenkins Rouge MD Signature Date/Time: 04/07/2022/10:52:24 AM    Final    CT RENAL STONE STUDY  Result Date: 04/08/2022 CLINICAL DATA:  Flank pain.  Kidney stone suspected. EXAM: CT ABDOMEN AND PELVIS WITHOUT CONTRAST TECHNIQUE: Multidetector CT imaging of the abdomen and pelvis was performed following the standard protocol without IV contrast. RADIATION DOSE REDUCTION: This exam was performed according to the departmental dose-optimization program which includes automated exposure control, adjustment of the mA and/or kV according to patient size and/or use of iterative reconstruction technique. COMPARISON:  CT a chest, abdomen and pelvis dated  04/06/2022. FINDINGS: Lower chest: No acute abnormality. Hepatobiliary: No focal liver abnormality is seen. No gallstones, gallbladder wall thickening, or biliary dilatation. Pancreas: Unremarkable. No pancreatic ductal dilatation or surrounding inflammatory changes.  Spleen: Normal in size without focal abnormality. Adrenals/Urinary Tract: No adrenal masses. Kidneys are normal in size, orientation and position. No renal masses, stones or hydronephrosis. Ureters are normal in course and in caliber. No ureteral stones. Normal bladder. Stomach/Bowel: Stomach moderately distended, otherwise unremarkable. Small bowel and colon are normal in caliber. No wall thickening. No inflammation. Multiple colonic diverticula. Normal appendix. Vascular/Lymphatic: Aortic atherosclerosis. No aneurysm. No enlarged lymph nodes. Reproductive: Unremarkable. Other: No abdominal wall hernia or abnormality. No abdominopelvic ascites. Musculoskeletal: No fracture or acute finding.  No bone lesion. IMPRESSION: 1. No acute findings within the abdomen or pelvis. No renal or ureteral stones or obstructive uropathy. No findings to account for flank pain. 2. Colonic diverticula without evidence of diverticulitis. 3. Aortic atherosclerosis. Electronically Signed   By: Lajean Manes M.D.   On: 04/08/2022 16:11   ECHO INTRAOPERATIVE TEE  Result Date: 04/12/2022  *INTRAOPERATIVE TRANSESOPHAGEAL REPORT *  Patient Name:   Austin Vincent [facility] Date of Exam: 04/12/2022 Medical Rec #:  [id number]           Height:       68.0 in Accession #:    [id number]          Weight:       161.8 lb Date of Birth:  July 27, 1971            BSA:          1.87 m Patient Age:    81 years            BP:           145/80 mmHg Patient Gender: M                   HR:           59 bpm. Exam Location:  Inpatient Transesophogeal exam was perform intraoperatively during surgical procedure. Patient was closely monitored under general anesthesia during the entirety of examination.  Indications:     122-I22.9 Subsequent ST elevation (STEM) and non-ST elevation                  (NSTEMI) myocardial infarction Performing Phys: [id number] PETER VANTRIGT Diagnosing Phys: Nolon Nations MD Complications: No known complications during this procedure. POST-OP IMPRESSIONS _ Left Ventricle: has normal systolic function, with an ejection fraction of 55-60%. _ Right Ventricle: The right ventricle appears unchanged from pre-bypass. _ Aorta: The aorta appears unchanged from pre-bypass. _ Left Atrial Appendage: The left atrial appendage appears unchanged from pre-bypass. _ Aortic Valve: The aortic valve appears unchanged from pre-bypass. _ Mitral Valve: The mitral valve appears unchanged from pre-bypass. _ Tricuspid Valve: The tricuspid valve appears unchanged from pre-bypass. _ Pulmonic Valve: The pulmonic valve appears unchanged from pre-bypass. _ Interatrial Septum: The interatrial septum appears unchanged from pre-bypass. PRE-OP FINDINGS  Left Ventricle: The left ventricle has mildly reduced systolic function, with an ejection fraction of 45-50%. The cavity size was normal. There is borderline left ventricular hypertrophy. Right Ventricle: The right ventricle has normal systolic function. The cavity was normal. There is no increase in right ventricular wall thickness. Left Atrium: Left atrial size was dilated. No left atrial/left atrial appendage thrombus was detected. Right Atrium: Right atrial size was normal in size. Interatrial Septum: No atrial level shunt detected by color flow Doppler. There is no evidence of a patent foramen ovale. Pericardium: There is no evidence of pericardial effusion. Mitral Valve: The mitral valve is normal in structure. Mitral valve regurgitation is moderate by color  flow Doppler. There is no evidence of mitral valve vegetation. Tricuspid Valve: The tricuspid valve was normal in structure. Tricuspid valve regurgitation is mild by color flow Doppler. Aortic Valve: The aortic valve  is tricuspid Aortic valve regurgitation is trivial by color flow Doppler. There is no stenosis of the aortic valve. There is no evidence of aortic valve vegetation. Pulmonic Valve: The pulmonic valve was normal in structure. Pulmonic valve regurgitation is trivial by color flow Doppler. Shunts: There is no evidence of an atrial septal defect. +-------------+--------++ AORTIC VALVE          +-------------+--------++ AV Mean Grad:4.0 mmHg +-------------+--------++  Nolon Nations MD Electronically signed by Nolon Nations MD Signature Date/Time: 04/12/2022/4:42:00 PM    Final    CT Angio Chest/Abd/Pel for Dissection W and/or Wo Contrast  Result Date: 04/06/2022 CLINICAL DATA:  Acute aortic syndrome suspected. EXAM: CT ANGIOGRAPHY CHEST, ABDOMEN AND PELVIS TECHNIQUE: Non-contrast CT of the chest was initially obtained. Multidetector CT imaging through the chest, abdomen and pelvis was performed using the standard protocol during bolus administration of intravenous contrast. Multiplanar reconstructed images and MIPs were obtained and reviewed to evaluate the vascular anatomy. RADIATION DOSE REDUCTION: This exam was performed according to the departmental dose-optimization program which includes automated exposure control, adjustment of the mA and/or kV according to patient size and/or use of iterative reconstruction technique. CONTRAST:  115m OMNIPAQUE IOHEXOL 350 MG/ML SOLN COMPARISON:  Lung cancer screening CT from 01/23/2022 and CT AP from 03/25/2022. FINDINGS: CTA CHEST FINDINGS Cardiovascular: Preferential opacification of the thoracic aorta. No evidence of thoracic aortic aneurysm or dissection. Mild aortic atherosclerosis. Coronary artery calcifications. Normal heart size. No pericardial effusion. Mediastinum/Nodes: 2.2 cm right lobe of thyroid gland nodule, image 11/7. This has been evaluated on previous imaging. (ref: J Am Coll Radiol. 2015 Feb;12(2): 143-50).The trachea appears patent and midline.  Normal appearance of the esophagus. No enlarged supraclavicular, axillary, mediastinal or hilar lymph nodes. Lungs/Pleura: There is no pleural effusion. No atelectasis or pneumothorax. Within the right upper lobe there is a nodular area of consolidation measuring 1.7 x 1.1 cm. Adjacent subpleural nodular density measures 1.6 by 0.8 cm. There are additional, smaller nodules including multiple clustered tree-in-bud nodules identified. Musculoskeletal: No chest wall abnormality. No acute or significant osseous findings. Review of the MIP images confirms the above findings. CTA ABDOMEN AND PELVIS FINDINGS VASCULAR Aorta: Extensive calcified and noncalcified atherosclerotic plaque noted. Normal caliber aorta without aneurysm, dissection, vasculitis or significant stenosis. Celiac: Patent without evidence of aneurysm, dissection, vasculitis or significant stenosis. SMA: The SME scratch set SMA appears patent. However, there is noncalcified plaque within the proximal SMA which results and focal luminal narrowing of approximately 50%, image 68/12. Calcified and noncalcified plaque Renals: Is noted at the origin of both renal arteries. The scratch set no significant stenosis of the left renal artery. Approximately 20% stenosis of the proximal right renal artery noted. IMA: Patent. Noncalcified plaque noted at the origin of the IMA with approximately 20% stenosis. Inflow: Patent without evidence of aneurysm, dissection, vasculitis or significant stenosis. Veins: No obvious venous abnormality within the limitations of this arterial phase study. Review of the MIP images confirms the above findings. NON-VASCULAR Hepatobiliary: No focal liver abnormality is seen. No gallstones, gallbladder wall thickening, or biliary dilatation. Pancreas: Unremarkable. No pancreatic ductal dilatation or surrounding inflammatory changes. Spleen: Normal in size without focal abnormality. Adrenals/Urinary Tract: Normal adrenal glands. No  nephrolithiasis, hydronephrosis or mass identified. Urinary bladder appears unremarkable. Stomach/Bowel: Stomach is within normal limits. Appendix  appears normal. No evidence of bowel wall thickening, distention, or inflammatory changes. Lymphatic: No abdominal or pelvic adenopathy identified. Reproductive: Prostate is unremarkable. Other: No free fluid or fluid collections. Musculoskeletal: No acute or significant osseous findings. Review of the MIP images confirms the above findings. IMPRESSION: 1. No evidence for aortic aneurysm or dissection. 2. Coronary artery calcifications noted. 3. Clustered nodular consolidation within the right upper lobe with surrounding ground-glass nodules identified. Nodules measure up to 12 mm. These are new compared with the lung cancer screening CT from 01/23/2022 and is favored to represent acute infectious process. According to consensus criteria consider a non-contrast Chest CT at 3 months, a PET/CT, or tissue sampling. These guidelines do not apply to immunocompromised patients and patients with cancer. Follow up in patients with significant comorbidities as clinically warranted. For lung cancer screening, adhere to Lung-RADS guidelines. Reference: Radiology. 2017; 284(1):228-43. 4. Extensive abdominopelvic aortic atherosclerotic disease with branch vessel involvement. Areas of noncritical stenosis are noted involving the superior mesenteric artery, inferior mesenteric artery, and right renal artery. 5. Coronary artery calcifications identified. Aortic Atherosclerosis (ICD10-I70.0). Electronically Signed   By: Kerby Moors M.D.   On: 04/06/2022 17:06   VAS US DOPPLER PRE CABG  Result Date: 04/10/2022 PREOPERATIVE VASCULAR EVALUATION Patient Name:  Austin Vincent [facility]  Date of Exam:   04/10/2022 Medical Rec #: [id number]            Accession #:    [id number] Date of Birth: 1971-03-20             Patient Gender: M Patient Age:   31 years Exam Location:  [facility]  Procedure:      VAS US DOPPLER PRE CABG Referring Phys: [name] VANTRIGT --------------------------------------------------------------------------------  Indications:      Pre-CABG. Risk Factors:     Hypertension, hyperlipidemia, prior MI, coronary artery                   disease. Comparison Study: No prior study Performing Technologist: Maudry Mayhew MHA, RVT, RDCS, RDMS  Examination Guidelines: A complete evaluation includes B-mode imaging, spectral Doppler, color Doppler, and power Doppler as needed of all accessible portions of each vessel. Bilateral testing is considered an integral part of a complete examination. Limited examinations for reoccurring indications may be performed as noted.  Right Carotid Findings: +----------+--------+--------+--------+--------+------------------+           PSV cm/sEDV cm/sStenosisDescribeComments           +----------+--------+--------+--------+--------+------------------+ CCA Prox  93      22                                         +----------+--------+--------+--------+--------+------------------+ CCA Distal82      21                                         +----------+--------+--------+--------+--------+------------------+ ICA Prox  76      23                      intimal thickening +----------+--------+--------+--------+--------+------------------+ ICA Distal98      38                                         +----------+--------+--------+--------+--------+------------------+  ECA       103     14                                         +----------+--------+--------+--------+--------+------------------+ +----------+--------+-------+----------------+------------+           PSV cm/sEDV cmsDescribe        Arm Pressure +----------+--------+-------+----------------+------------+ Subclavian121            Multiphasic, WNL             +----------+--------+-------+----------------+------------+  +---------+--------+--+--------+--+---------+ VertebralPSV cm/s54EDV cm/s17Antegrade +---------+--------+--+--------+--+---------+ Left Carotid Findings: +----------+--------+--------+--------+-----------------------+--------+           PSV cm/sEDV cm/sStenosisDescribe               Comments +----------+--------+--------+--------+-----------------------+--------+ CCA Prox  122     24                                              +----------+--------+--------+--------+-----------------------+--------+ CCA Distal84      20                                              +----------+--------+--------+--------+-----------------------+--------+ ICA Prox  84      36              smooth and heterogenous         +----------+--------+--------+--------+-----------------------+--------+ ICA Distal96      44                                              +----------+--------+--------+--------+-----------------------+--------+ ECA       88      16              heterogenous                    +----------+--------+--------+--------+-----------------------+--------+ +----------+--------+--------+----------------+------------+ SubclavianPSV cm/sEDV cm/sDescribe        Arm Pressure +----------+--------+--------+----------------+------------+           170             Multiphasic, WNL             +----------+--------+--------+----------------+------------+ +---------+--------+--+--------+--+---------+ VertebralPSV cm/s52EDV cm/s18Antegrade +---------+--------+--+--------+--+---------+  ABI Findings: +--------+------------------+-----+---------+--------+ Right   Rt Pressure (mmHg)IndexWaveform Comment  +--------+------------------+-----+---------+--------+ [id number]                    triphasic         +--------+------------------+-----+---------+--------+ PTA                            biphasic           +--------+------------------+-----+---------+--------+ DP                             triphasic         +--------+------------------+-----+---------+--------+ +--------+------------------+-----+---------+-------+ Left    Lt Pressure (mmHg)IndexWaveform Comment +--------+------------------+-----+---------+-------+ [id number]                    triphasic        +--------+------------------+-----+---------+-------+  PTA                            triphasic        +--------+------------------+-----+---------+-------+ DP                             triphasic        +--------+------------------+-----+---------+-------+  Right Doppler Findings: +-----------+--------+-----+---------+--------------------+ Site       PressureIndexDoppler  Comments             +-----------+--------+-----+---------+--------------------+ Brachial   175          triphasic                     +-----------+--------+-----+---------+--------------------+ Radial                  triphasic                     +-----------+--------+-----+---------+--------------------+ Ulnar                   biphasic                      +-----------+--------+-----+---------+--------------------+ Palmar Arch                      Within normal limits +-----------+--------+-----+---------+--------------------+  Left Doppler Findings: +-----------+--------+-----+---------+--------------------+ Site       PressureIndexDoppler  Comments             +-----------+--------+-----+---------+--------------------+ Brachial   159          triphasic                     +-----------+--------+-----+---------+--------------------+ Radial                  triphasic                     +-----------+--------+-----+---------+--------------------+ Ulnar                   biphasic                      +-----------+--------+-----+---------+--------------------+ Palmar Arch                      Within  normal limits +-----------+--------+-----+---------+--------------------+  Summary: Right Carotid: The extracranial vessels were near-normal with only minimal wall                thickening or plaque. Left Carotid: The extracranial vessels were near-normal with only minimal wall               thickening or plaque. Vertebrals:  Bilateral vertebral arteries demonstrate antegrade flow. Subclavians: Normal flow hemodynamics were seen in bilateral subclavian              arteries. Bilateral ABI: Bilateral pedal waveforms are within normal range at rest. Right Upper Extremity: Doppler waveforms remain within normal limits with right radial compression. Doppler waveforms remain within normal limits with right ulnar compression. Left Upper Extremity: Doppler waveforms remain within normal limits with left radial compression. Doppler waveforms remain within normal limits with left ulnar compression.  Electronically signed by Harold Barban MD on 04/10/2022 at 7:56:35 PM.    Final    Discharge Instructions     Amb Referral to Cardiac Rehabilitation   Complete  by: As directed    Diagnosis: CABG   CABG X ___: 4   After initial evaluation and assessments completed: Virtual Based Care may be provided alone or in conjunction with Phase 2 Cardiac Rehab based on patient barriers.: Yes       Discharge Medications: Allergies as of 04/17/2022   No Known Allergies      Medication List     STOP taking these medications    amLODipine 2.5 MG tablet Commonly known as: NORVASC   hydrocodone-ibuprofen 5-200 MG tablet Commonly known as: VICOPROFEN   metoprolol succinate 25 MG 24 hr tablet Commonly known as: TOPROL-XL   nitroGLYCERIN 0.4 MG SL tablet Commonly known as: Nitrostat   phenazopyridine 100 MG tablet Commonly known as: Pyridium       TAKE these medications    aspirin 81 MG tablet Take 81 mg by mouth daily.   atorvastatin 80 MG tablet Commonly known as: LIPITOR Take 1 tablet (80 mg total) by  mouth daily.   clopidogrel 75 MG tablet Commonly known as: PLAVIX Take 1 tablet (75 mg total) by mouth daily.   ezetimibe 10 MG tablet Commonly known as: ZETIA Take 1 tablet (10 mg total) by mouth daily.   ferrous sulfate 325 (65 FE) MG EC tablet Take 1 tablet (325 mg total) by mouth daily with breakfast. For one month then stop. If develops constipation, may stop sooner   furosemide 40 MG tablet Commonly known as: LASIX Take 1 tablet (40 mg total) by mouth daily.   GAS-X PO Take 1 tablet by mouth daily as needed (gas).   isosorbide mononitrate 30 MG 24 hr tablet Commonly known as: IMDUR Take 0.5 tablets (15 mg total) by mouth daily.   metoprolol tartrate 25 MG tablet Commonly known as: LOPRESSOR Take 0.5 tablets (12.5 mg total) by mouth 2 (two) times daily.   potassium chloride SA 20 MEQ tablet Commonly known as: KLOR-CON M Take 1 tablet (20 mEq total) by mouth daily.   tamsulosin 0.4 MG Caps capsule Commonly known as: FLOMAX Take 1 capsule (0.4 mg total) by mouth daily. What changed:  when to take this reasons to take this   traMADol 50 MG tablet Commonly known as: ULTRAM Take 1 tablet (50 mg total) by mouth every 6 (six) hours as needed for moderate pain.       The patient has been discharged on:   1.Beta Blocker:  Yes [ X  ]                              No   [   ]                              If No, reason:  2.Ace Inhibitor/ARB: Yes [   ]                                     No  [  X  ]                                     If No, reason: labile BP  3.Statin:   Yes [ X  ]  No  [   ]                  If No, reason:  4.Ecasa:  Yes  [ X  ]                  No   [   ]                  If No, reason:  Patient had ACS upon admission:Y  Plavix/P2Y12 inhibitor: Yes [ Y  ]                                      No  [   ]    Follow Up Appointments:  Follow-up Information     Lelon Perla, MD. Go on 05/07/2022.   Specialty:  Cardiology Why: Appointment time is at 10:00 am Contact information: [address] [place] [address] [phone]         Dr. Prescott Gum. Go on 05/13/2022.   Why: PA/LAT CXR to be taken (at [facility] which is in the same building as Dr. Lucianne Lei Trigt's office) on 07/03 at 2:30 pm;Appointment time is at 3:00 pm Contact information: [address] [place], [place], [place] [address] [phone]        [facility]. Go on 04/24/2022.   Specialty: Cardiothoracic Surgery Why: Appointment is with nurse for chest tube suture removal and LUE staple removal (all staples). Appointment time is at 10:30 am Contact information: [place], [place] [phone]                Signed:  Ellamae Sia 04/17/2022, 8:21 AM   patient examined and medical record reviewed,agree with above note.   Discharge instructions reviewed with patient [name] 04/17/2022

## 2022-04-12 NOTE — Brief Op Note (Signed)
04/06/2022 - 04/12/2022  1:12 PM  PATIENT:  Austin Vincent  51 y.o. male  PRE-OPERATIVE DIAGNOSIS:  1. S/p NSTEMI 2.CAD  POST-OPERATIVE DIAGNOSIS:  1. S/p NSTEMI 2.CAD  PROCEDURE:TRANSESOPHAGEAL ECHOCARDIOGRAM (TEE), CORONARY ARTERY BYPASS GRAFTING (CABG) X 4 (LIMA to LAD, LEFT RADIAL ARTERY to DIAGONAL, SVG to OM, SVG to PDA) USING OPEN LEFT INTERNAL MAMMARY ARTERY, OPEN LEFT RADIAL ARTERY, AND ENDOSOCPIC RIGHT AND LEFT GREATER SAPHENOUS VEIN HARVEST.  Left radial artery harvest time: 30 minutes Left radial artery prep time: 5 minutes Right EVH harvest time: 30 min Right EVH prep time: 16 min Left EVH harvest time: 15 minutes Left EVH prep time: 8 minutes  SURGEON:  Surgeon(s) and Role:    [name], MD - Primary  PHYSICIAN ASSISTANT: Lars Pinks PA-C  ASSISTANTS: Ara Kussmaul RNFA   ANESTHESIA:   general  EBL:  Per anesthesia, perfusion record  DRAINS:  Chest tubes placed in the mediastinal and pleural spaces    COUNTS CORRECT:  YES  DICTATION: .Dragon Dictation  PLAN OF CARE: Admit to inpatient   PATIENT DISPOSITION:  ICU - intubated and hemodynamically stable.   Delay start of Pharmacological VTE agent (>24hrs) due to surgical blood loss or risk of bleeding: no  BASELINE WEIGHT: 73.4 kg

## 2022-04-12 NOTE — Progress Notes (Signed)
Pre Procedure note for inpatients:   Austin Vincent has been scheduled for Procedure(s): CORONARY ARTERY BYPASS GRAFTING (CABG) (N/A) RADIAL ARTERY HARVEST (Left) TRANSESOPHAGEAL ECHOCARDIOGRAM (TEE) (N/A) today. The various methods of treatment have been discussed with the patient. After consideration of the risks, benefits and treatment options the patient has consented to the planned procedure.   The patient has been seen and labs reviewed. There are no changes in the patient's condition to prevent proceeding with the planned procedure today.  Recent labs:  Lab Results  Component Value Date   WBC 8.3 04/12/2022   HGB 14.6 04/12/2022   HCT 41.1 04/12/2022   PLT 204 04/12/2022   GLUCOSE 113 (H) 04/12/2022   CHOL 126 03/27/2022   TRIG 59 03/27/2022   HDL 45 03/27/2022   LDLDIRECT 190.7 03/24/2013   LDLCALC 68 03/27/2022   ALT 112 (H) 04/10/2022   AST 100 (H) 04/10/2022   NA 137 04/12/2022   K 4.1 04/12/2022   CL 105 04/12/2022   CREATININE 0.94 04/12/2022   BUN 10 04/12/2022   CO2 25 04/12/2022   TSH 0.981 04/10/2022   HGBA1C 5.5 04/10/2022    Austin Byes, MD 04/12/2022 7:07 AM

## 2022-04-12 NOTE — Hospital Course (Addendum)
HPI: We are asked to see this 51 year old male in cardiothoracic surgical consultation for consideration of coronary artery surgical revascularization.  Patient has a long history of CAD having had his first event in 35 at age 58.  At that time he had an inferior myocardial infarction with stenting of the right coronary artery.  He was admitted in 2015 with a non-ST elevation myocardial infarction at which time was found to have an occluded LAD (chronic) and the RCA was also occluded.  Ejection fraction at that time was 45 to 55%.  He underwent PCI of the right coronary and PDA.    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  Patient does experience occasional anginal equivalents of throat tightness which is similar to his previous infarct pain.  He also has elevation of his heart rate at times with exertion.  He presented to the emergency department on 04/06/2022 with a loss of consciousness.  The patient states he had not eaten during the day and while he was driving he was feeling very hungry and developed dizziness, lightheadedness, diaphoresis causing him to pull over to the side of the road.  He apparently lost consciousness and a bystander found him to be confused and he was noted to be incontinent of urine.  He denied chest pain or shortness of breath.  He has no previous history of seizure or syncope.  EKG noted mild ST elevation in V2 and V3.  Cardiology consultation was obtained.  High-sensitivity troponin has peaked at 2302.  He was admitted for further medical stabilization and diagnostic evaluation.  Echocardiogram has been performed and the full report as described below.  Left ventricular ejection fraction by estimation is 50 to 55%.  Cardiac catheterization shows severe three-vessel obstructive disease with mildly reduced LV function, and for  apical hypokinesis and normal LVEDP. Dr. Prescott Gum discussed the need for coronary artery bypass grafting surgery. Potential risks, benefits, and complications of the surgery were discussed with the patient and he agreed to proceed with surgery. Pre operative carotid duplex US showed no significant internal carotid artery stenosis bilaterally.  Hospital Course: Patient underwent a CABG x 4. He was transferred from the OR to [facility] ICU in stable condition.  He remained hemodynamically stable on milrinone and norepinephrine.  He was weaned from the ventilator per protocol and extubated on the morning of the first postoperative day.  Milrinone and norepinephrine were weaned off on the first postoperative day.  Chest tubes and monitoring lines were removed routinely.  He was mobilized.  Diuresis was begun for expected volume excess.

## 2022-04-12 NOTE — Transfer of Care (Signed)
Immediate Anesthesia Transfer of Care Note  Patient: Austin Vincent  Procedure(s) Performed: CORONARY ARTERY BYPASS GRAFTING (CABG) X FOUR BYPASSES USING OPEN LEFT INTERNAL MAMMARY ARTERY, OPEN LEFT RADIAL ARTERY, AND ENDOSOCPIC RIGHT AND LEFT GREATER SAPHENOUS VEIN HARVEST. (Chest) RADIAL ARTERY HARVEST (Left: Arm Lower) TRANSESOPHAGEAL ECHOCARDIOGRAM (TEE) (Chest)  Patient Location: SICU  Anesthesia Type:General  Level of Consciousness: Patient remains intubated per anesthesia plan  Airway & Oxygen Therapy: Patient remains intubated per anesthesia plan and Patient placed on Ventilator (see vital sign flow sheet for setting)  Post-op Assessment: Report given to RN and Post -op Vital signs reviewed and stable  Post vital signs: Reviewed and stable, IBP 117/64, HR 95 NSR, Pox 100%  Last Vitals:  Vitals Value Taken Time  BP    Temp 35.7 C 04/12/22 1527  Pulse 93 04/12/22 1527  Resp 12 04/12/22 1527  SpO2 100 % 04/12/22 1527  Vitals shown include unvalidated device data.  Last Pain:  Vitals:   04/12/22 0411  TempSrc: Oral  PainSc:          Complications: No notable events documented.

## 2022-04-12 NOTE — Progress Notes (Signed)
  Echocardiogram Echocardiogram Transesophageal has been performed.  Austin Vincent 04/12/2022, 8:34 AM

## 2022-04-12 NOTE — Anesthesia Postprocedure Evaluation (Signed)
Anesthesia Post Note  Patient: Austin Vincent  Procedure(s) Performed: CORONARY ARTERY BYPASS GRAFTING (CABG) X FOUR BYPASSES USING OPEN LEFT INTERNAL MAMMARY ARTERY, OPEN LEFT RADIAL ARTERY, AND ENDOSOCPIC RIGHT AND LEFT GREATER SAPHENOUS VEIN HARVEST. (Chest) RADIAL ARTERY HARVEST (Left: Arm Lower) TRANSESOPHAGEAL ECHOCARDIOGRAM (TEE) (Chest)     Patient location during evaluation: SICU Anesthesia Type: General Level of consciousness: sedated and patient remains intubated per anesthesia plan Pain management: pain level controlled Vital Signs Assessment: post-procedure vital signs reviewed and stable Respiratory status: patient remains intubated per anesthesia plan and patient on ventilator - see flowsheet for VS Cardiovascular status: stable Anesthetic complications: no   No notable events documented.  Last Vitals:  Vitals:   04/12/22 1615 04/12/22 1630  BP:    Pulse: 92 92  Resp: 12 12  Temp: (!) 35.5 C (!) 35.5 C  SpO2: 100% 100%    Last Pain:  Vitals:   04/12/22 1630  TempSrc: Core  PainSc:                  Nolon Nations

## 2022-04-12 NOTE — Discharge Instructions (Signed)

## 2022-04-12 NOTE — Anesthesia Procedure Notes (Signed)
Arterial Line Insertion Start/End6/12/2021 6:50 AM, 04/12/2022 7:25 AM Performed by: Nolon Nations, MD  Patient location: Pre-op. Preanesthetic checklist: patient identified, IV checked, site marked, risks and benefits discussed, surgical consent, monitors and equipment checked, pre-op evaluation, timeout performed and anesthesia consent Lidocaine 1% used for infiltration Right, radial was placed Catheter size: 20 G Hand hygiene performed  and maximum sterile barriers used   Attempts: 4 Procedure performed using ultrasound guided technique. Ultrasound Notes:anatomy identified, needle tip was noted to be adjacent to the nerve/plexus identified, no ultrasound evidence of intravascular and/or intraneural injection and image(s) printed for medical record Following insertion, dressing applied and Biopatch. Post procedure assessment: normal and unchanged  Post procedure complications: second provider assisted and unsuccessful attempts. Patient tolerated the procedure with difficulty.

## 2022-04-13 ENCOUNTER — Inpatient Hospital Stay (HOSPITAL_COMMUNITY): Payer: BC Managed Care – PPO

## 2022-04-13 LAB — CBC
HCT: 20.3 % — ABNORMAL LOW (ref 39.0–52.0)
HCT: 24 % — ABNORMAL LOW (ref 39.0–52.0)
Hemoglobin: 7.3 g/dL — ABNORMAL LOW (ref 13.0–17.0)
Hemoglobin: 8.3 g/dL — ABNORMAL LOW (ref 13.0–17.0)
MCH: 30.2 pg (ref 26.0–34.0)
MCH: 29.2 pg (ref 26.0–34.0)
MCHC: 36 g/dL (ref 30.0–36.0)
MCHC: 34.6 g/dL (ref 30.0–36.0)
MCV: 83.9 fL (ref 80.0–100.0)
MCV: 84.5 fL (ref 80.0–100.0)
Platelets: 121 10*3/uL — ABNORMAL LOW (ref 150–400)
Platelets: 90 10*3/uL — ABNORMAL LOW (ref 150–400)
RBC: 2.42 MIL/uL — ABNORMAL LOW (ref 4.22–5.81)
RBC: 2.84 MIL/uL — ABNORMAL LOW (ref 4.22–5.81)
RDW: 13.8 % (ref 11.5–15.5)
RDW: 14.3 % (ref 11.5–15.5)
WBC: 6.1 10*3/uL (ref 4.0–10.5)
WBC: 7.2 10*3/uL (ref 4.0–10.5)
nRBC: 0 % (ref 0.0–0.2)
nRBC: 0 % (ref 0.0–0.2)

## 2022-04-13 LAB — POCT I-STAT 7, (LYTES, BLD GAS, ICA,H+H)
Acid-base deficit: 1 mmol/L (ref 0.0–2.0)
Acid-base deficit: 1 mmol/L (ref 0.0–2.0)
Acid-base deficit: 1 mmol/L (ref 0.0–2.0)
Bicarbonate: 24.3 mmol/L (ref 20.0–28.0)
Bicarbonate: 24.8 mmol/L (ref 20.0–28.0)
Bicarbonate: 23.9 mmol/L (ref 20.0–28.0)
Calcium, Ion: 1.08 mmol/L — ABNORMAL LOW (ref 1.15–1.40)
Calcium, Ion: 1.08 mmol/L — ABNORMAL LOW (ref 1.15–1.40)
Calcium, Ion: 1.08 mmol/L — ABNORMAL LOW (ref 1.15–1.40)
HCT: 20 % — ABNORMAL LOW (ref 39.0–52.0)
HCT: 23 % — ABNORMAL LOW (ref 39.0–52.0)
HCT: 36 % — ABNORMAL LOW (ref 39.0–52.0)
Hemoglobin: 6.8 g/dL — CL (ref 13.0–17.0)
Hemoglobin: 7.8 g/dL — ABNORMAL LOW (ref 13.0–17.0)
Hemoglobin: 12.2 g/dL — ABNORMAL LOW (ref 13.0–17.0)
O2 Saturation: 99 %
O2 Saturation: 97 %
O2 Saturation: 95 %
Patient temperature: 36.2
Patient temperature: 36.1
Patient temperature: 36.9
Potassium: 4.5 mmol/L (ref 3.5–5.1)
Potassium: 4.5 mmol/L (ref 3.5–5.1)
Potassium: 4.7 mmol/L (ref 3.5–5.1)
Sodium: 136 mmol/L (ref 135–145)
Sodium: 136 mmol/L (ref 135–145)
Sodium: 134 mmol/L — ABNORMAL LOW (ref 135–145)
TCO2: 26 mmol/L (ref 22–32)
TCO2: 26 mmol/L (ref 22–32)
TCO2: 25 mmol/L (ref 22–32)
pCO2 arterial: 43.6 mmHg (ref 32–48)
pCO2 arterial: 44.9 mmHg (ref 32–48)
pCO2 arterial: 38.4 mmHg (ref 32–48)
pH, Arterial: 7.35 (ref 7.35–7.45)
pH, Arterial: 7.346 — ABNORMAL LOW (ref 7.35–7.45)
pH, Arterial: 7.401 (ref 7.35–7.45)
pO2, Arterial: 121 mmHg — ABNORMAL HIGH (ref 83–108)
pO2, Arterial: 88 mmHg (ref 83–108)
pO2, Arterial: 75 mmHg — ABNORMAL LOW (ref 83–108)

## 2022-04-13 LAB — BASIC METABOLIC PANEL
Anion gap: 5 (ref 5–15)
Anion gap: 6 (ref 5–15)
BUN: 8 mg/dL (ref 6–20)
BUN: 9 mg/dL (ref 6–20)
CO2: 25 mmol/L (ref 22–32)
CO2: 25 mmol/L (ref 22–32)
Calcium: 7.2 mg/dL — ABNORMAL LOW (ref 8.9–10.3)
Calcium: 7.6 mg/dL — ABNORMAL LOW (ref 8.9–10.3)
Chloride: 104 mmol/L (ref 98–111)
Chloride: 100 mmol/L (ref 98–111)
Creatinine, Ser: 0.74 mg/dL (ref 0.61–1.24)
Creatinine, Ser: 0.95 mg/dL (ref 0.61–1.24)
GFR, Estimated: >60 mL/min (ref >60)
GFR, Estimated: >60 mL/min (ref >60)
Glucose, Bld: 129 mg/dL — ABNORMAL HIGH (ref 70–99)
Glucose, Bld: 132 mg/dL — ABNORMAL HIGH (ref 70–99)
Potassium: 4.4 mmol/L (ref 3.5–5.1)
Potassium: 4.2 mmol/L (ref 3.5–5.1)
Sodium: 134 mmol/L — ABNORMAL LOW (ref 135–145)
Sodium: 131 mmol/L — ABNORMAL LOW (ref 135–145)

## 2022-04-13 LAB — GLUCOSE, CAPILLARY
Glucose-Capillary: 118 mg/dL — ABNORMAL HIGH (ref 70–99)
Glucose-Capillary: 154 mg/dL — ABNORMAL HIGH (ref 70–99)
Glucose-Capillary: 131 mg/dL — ABNORMAL HIGH (ref 70–99)
Glucose-Capillary: 128 mg/dL — ABNORMAL HIGH (ref 70–99)
Glucose-Capillary: 120 mg/dL — ABNORMAL HIGH (ref 70–99)
Glucose-Capillary: 128 mg/dL — ABNORMAL HIGH (ref 70–99)
Glucose-Capillary: 106 mg/dL — ABNORMAL HIGH (ref 70–99)
Glucose-Capillary: 78 mg/dL (ref 70–99)
Glucose-Capillary: 140 mg/dL — ABNORMAL HIGH (ref 70–99)
Glucose-Capillary: 132 mg/dL — ABNORMAL HIGH (ref 70–99)

## 2022-04-13 LAB — PREPARE FRESH FROZEN PLASMA
Unit division: 0
Unit division: 0

## 2022-04-13 LAB — BPAM CRYOPRECIPITATE
Blood Product Expiration Date: 202306022215
Blood Product Expiration Date: 202306022215
ISSUE DATE / TIME: 202306021634
ISSUE DATE / TIME: 202306021634
Unit Type and Rh: 6200
Unit Type and Rh: 6200

## 2022-04-13 LAB — BPAM PLATELET PHERESIS
Blood Product Expiration Date: 202306042359
ISSUE DATE / TIME: 202306021738
Unit Type and Rh: 6200

## 2022-04-13 LAB — PREPARE CRYOPRECIPITATE
Unit division: 0
Unit division: 0

## 2022-04-13 LAB — MAGNESIUM
Magnesium: 2.6 mg/dL — ABNORMAL HIGH (ref 1.7–2.4)
Magnesium: 2.2 mg/dL (ref 1.7–2.4)

## 2022-04-13 LAB — PREPARE PLATELET PHERESIS: Unit division: 0

## 2022-04-13 LAB — BPAM FFP
Blood Product Expiration Date: 202306052359
Blood Product Expiration Date: 202306052359
ISSUE DATE / TIME: 202306021412
ISSUE DATE / TIME: 202306021412
Unit Type and Rh: 6200
Unit Type and Rh: 6200

## 2022-04-13 LAB — PREPARE RBC (CROSSMATCH)

## 2022-04-13 LAB — COOXEMETRY PANEL
Carboxyhemoglobin: 1.6 % — ABNORMAL HIGH (ref 0.5–1.5)
Methemoglobin: <0.7 % (ref 0.0–1.5)
O2 Saturation: 67.8 %
Total hemoglobin: 7 g/dL — ABNORMAL LOW (ref 12.0–16.0)

## 2022-04-13 MED ORDER — INSULIN DETEMIR 100 UNIT/ML ~~LOC~~ SOLN
20.0000 [IU] | Freq: Every day | SUBCUTANEOUS | Status: DC
  Administered 2022-04-13 – 2022-04-15 (×3): 20 [IU] via SUBCUTANEOUS
  Filled 2022-04-13 (×4): qty 0.2

## 2022-04-13 MED ORDER — MILRINONE LACTATE IN DEXTROSE 20-5 MG/100ML-% IV SOLN
0.1250 ug/kg/min | INTRAVENOUS | Status: DC
  Administered 2022-04-13: 0.125 ug/kg/min via INTRAVENOUS

## 2022-04-13 MED ORDER — FUROSEMIDE 10 MG/ML IJ SOLN
40.0000 mg | Freq: Two times a day (BID) | INTRAMUSCULAR | Status: AC
  Administered 2022-04-13 (×2): 40 mg via INTRAVENOUS
  Filled 2022-04-13 (×2): qty 4

## 2022-04-13 MED ORDER — ENOXAPARIN SODIUM 40 MG/0.4ML IJ SOSY
40.0000 mg | PREFILLED_SYRINGE | Freq: Every day | INTRAMUSCULAR | Status: DC
Start: 2022-04-13 — End: 2022-04-14
  Administered 2022-04-13: 40 mg via SUBCUTANEOUS
  Filled 2022-04-13: qty 0.4

## 2022-04-13 MED ORDER — ALUM & MAG HYDROXIDE-SIMETH 200-200-20 MG/5ML PO SUSP
30.0000 mL | Freq: Four times a day (QID) | ORAL | Status: DC | PRN
  Filled 2022-04-13: qty 30

## 2022-04-13 MED ORDER — SODIUM CHLORIDE 0.9% IV SOLUTION: Freq: Once | INTRAVENOUS | Status: AC

## 2022-04-13 MED ORDER — INSULIN ASPART 100 UNIT/ML IJ SOLN
0.0000 [IU] | INTRAMUSCULAR | Status: DC
  Administered 2022-04-13 (×2): 2 [IU] via SUBCUTANEOUS

## 2022-04-13 MED ORDER — FE FUMARATE-B12-VIT C-FA-IFC PO CAPS
1.0000 | ORAL_CAPSULE | Freq: Two times a day (BID) | ORAL | Status: DC
  Administered 2022-04-13 – 2022-04-17 (×9): 1 via ORAL
  Filled 2022-04-13 (×9): qty 1

## 2022-04-13 NOTE — Progress Notes (Signed)
EKG CRITICAL VALUE     12 lead EKG performed.  Critical value noted.  Lorrin Jackson, RN notified.   Ouida Sills, CCT 04/13/2022 7:29 AM

## 2022-04-13 NOTE — Progress Notes (Signed)
Patient ID: EULON ALLNUTT, male   DOB: 12-21-1970, 51 y.o.   MRN: [id number]  TCTS Evening Rounds:  Hemodynamically stable in sinus rhythm 80's.  UO good  CT output low.  Up in chair.  BMET    Component Value Date/Time   NA 131 (L) 04/13/2022 1626   NA 142 04/03/2022 1623   K 4.2 04/13/2022 1626   CL 100 04/13/2022 1626   CO2 25 04/13/2022 1626   GLUCOSE 132 (H) 04/13/2022 1626   BUN 9 04/13/2022 1626   BUN 9 04/03/2022 1623   CREATININE 0.95 04/13/2022 1626   CALCIUM 7.6 (L) 04/13/2022 1626   EGFR 99 04/03/2022 1623   GFRNONAA >60 04/13/2022 1626   CBC    Component Value Date/Time   WBC 7.2 04/13/2022 1626   RBC 2.84 (L) 04/13/2022 1626   HGB 8.3 (L) 04/13/2022 1626   HGB 15.4 04/03/2022 1623   HCT 24.0 (L) 04/13/2022 1626   HCT 43.2 04/03/2022 1623   PLT PENDING 04/13/2022 1626   PLT 221 04/03/2022 1623   MCV 84.5 04/13/2022 1626   MCV 87 04/03/2022 1623   MCH 29.2 04/13/2022 1626   MCHC 34.6 04/13/2022 1626   RDW 14.3 04/13/2022 1626   RDW 12.7 04/03/2022 1623   LYMPHSABS 2.3 04/12/2022 0441   MONOABS 0.8 04/12/2022 0441   EOSABS 0.5 04/12/2022 0441   BASOSABS 0.0 04/12/2022 0441

## 2022-04-13 NOTE — Op Note (Signed)
NAME: Austin Vincent, Austin Vincent MEDICAL RECORD NO: [id number] ACCOUNT NO: [id number] DATE OF BIRTH: 07-20-1971 FACILITY: [facility] LOCATION: [facility] PHYSICIAN: Ivin Poot III, MD  Operative Report   DATE OF PROCEDURE: 04/12/2022  OPERATION: 1.  Coronary artery bypass grafting x4 (left internal mammary artery to LAD, saphenous vein graft to posterior descending, left radial artery free graft to diagonal, saphenous vein graft to ramus intermediate). 2.  Harvest of left radial artery graft. 3.  Endoscopic harvest of right and left greater saphenous vein.  PREOPERATIVE DIAGNOSIS:  Severe 3-vessel coronary artery disease with moderate LV dysfunction with syncope.  POSTOPERATIVE DIAGNOSIS:  Severe 3-vessel coronary artery disease with moderate LV dysfunction with syncope.  SURGEON:  Len Childs, MD  ASSISTANT:  Lars Pinks, PA-C.  A surgical first assistant was required to perform this procedure due to its complexity and the standard of care for cardiac surgery at this institution.  The first assistant was needed to harvest the conduit including the left radial artery free graft  and the saphenous vein endoscopically harvested from both legs.  The first assistant also was needed to close those incisions and to first assist with the distal anastomosis of the coronary anastomoses for exposure, suctioning, and suture management.  ANESTHESIA:  General.  OPERATIVE FINDINGS: 1.  Adequate conduit. 2.  Difficult targets due to longstanding severe multivessel coronary disease with occluded stents and native vessel occlusion of the RCA and the LAD. 3.  Coagulopathy after reversal of heparin with protamine, requiring FFP and 1 pack cell transfusion.  DESCRIPTION OF PROCEDURE:  The patient was brought to the operating room after informed consent was documented in the preoperative holding area and final issues were addressed in a face-to-face encounter.  The patient was placed supine on the  operating  room table and general anesthesia was induced.  He remained stable.  A transesophageal echo probe was placed by the anesthesia team and this confirmed the preoperative diagnosis of moderate LV dysfunction, mild-moderate MR, mild AI.  The patient was then prepped and draped as a sterile field after the Foley catheter was placed.  A proper timeout was performed.  A sternal incision was made as the left radial artery was harvested with a left forearm incision.  The left radial artery  was a good vessel and there was a pulse palpable distal in the hand when the radial artery was briefly clamped with a vascular bulldog.  The radial artery was flushed with papaverine, heparin solution and the arm was closed and then tucked to the  patient's side.  The left internal mammary artery was harvested as a pedicle graft from its origin at the subclavian vessels.  It was 1.5 mm vessel with excellent flow.  The saphenous vein was harvested endoscopically from both the left and right legs.  It was somewhat small and thin, but adequate as a conduit.  The sternal retractor was then placed and the pericardium was opened and suspended.  Pursestrings were placed in the ascending aorta and right atrium.  Heparin was administered and ACT was documented as being therapeutic.  The patient was cannulated and  placed on cardiopulmonary bypass.  The coronaries were identified for grafting.  The distal right vessels were too small and the distal circumflex vessels were too small to graft.  Cardioplegia cannulas were placed both antegrade and retrograde cold blood cardioplegia and the patient was cooled to 32 degrees.  The aortic crossclamp was applied and 1 liter of cold blood cardioplegia was delivered  in split doses between the antegrade  aortic and retrograde coronary catheters.  There was good cardioplegic arrest and supple temperature dropped less than 12 degrees.  Cardioplegia was delivered every 20 minutes  while the crossclamp was in place.  The distal coronary anastomoses were performed.  The first distal anastomosis was the posterior descending.  This was an occluded proximal vessel.  The posterior descending was 1.5 mm with a thickened atherosclerotic wall.  A reverse saphenous vein was  sewn end-to-side with running 7-0 Prolene.  There was good flow through the graft.  Cardioplegia was redosed.  The second distal anastomosis was to the ramus intermediate branch of the left coronary.  There was a proximal 50%-60% stenosis.  A reverse saphenous vein was sewn end-to-side with running 7-0 Prolene to this intramyocardial vessel and there was good  flow through the graft.  Cardioplegia was redosed.  The third distal anastomosis was to the larger of the two diagonal vessels.  There was a proximal 90% stenosis.  The radial artery graft was then sewn end-to-side with running 8-0 Prolene and there was good flow through the graft.  Cardioplegia was  redosed.  The final distal anastomosis was to the LAD distal to where it was heavily calcified and totally occluded.  The vessel wall was thickened, but patent distally with a 1.5 mm probe.  The left IMA pedicle was brought through an opening and the left lateral  pericardium was brought down onto the LAD and sewn end-to-side with running 8-0 Prolene.  There was good flow through the anastomosis after briefly releasing the pedicle bulldog and the mammary artery.  The bulldog was reapplied and the pedicle secured  to epicardium with 6-0 Prolenes.  Cardioplegia was redosed.  While the crossclamp was still in place, the 2 proximal vein anastomoses were performed with a running 6-0 Prolene.  A 4.5 mm punch was used for each anastomosis.  Prior to tying down the final proximal anastomosis, air was vented from the coronaries  with a dose of retrograde warm blood cardioplegia and the usual de-airing maneuvers on bypass.  The crossclamp was removed.  After the vein  grafts were deaired, the radial artery graft was sewn to the hood of the vein graft to the ramus intermediate with running 7-0 Prolene.  The bulldog was removed and there was good flow through the grafts.  The heart resumed a spontaneous rhythm.  The proximal and distal anastomoses were checked and found to be hemostatic.  The patient was rewarmed and reperfused.  Temporary pacing wires were applied.  The lungs were expanded and ventilator was resumed.   The patient was then weaned from cardiopulmonary bypass on low dose milrinone with excellent global LV function and a definite improvement from preoperative EF.  The protamine was administered without adverse reaction.  However, there is still considerable coagulopathy in the soft tissues in the sternum and the mediastinal fat and chest wall at the mammary artery bed.  The patient was given FFP and DDAVP to help  improve coagulation function.  The superior pericardial fat was closed over the aorta and vein grafts.  Anterior and posterior mediastinal tubes and bilateral pleural tubes were placed and brought out through separate incisions.  The sternum was closed  with interrupted steel wire.  The patient remained stable.  The pectoralis fascia was closed with a running #1 Vicryl.  The subcutaneous and skin layers were closed with running Vicryl and sterile dressings were applied.  Total cardiopulmonary bypass  time  was 149 minutes.   NIK D: 04/12/2022 6:19:31 pm T: 04/13/2022 1:04:00 am  JOB: [id number]/ [id number]

## 2022-04-13 NOTE — Progress Notes (Signed)
1 Day Post-Op Procedure(s) (LRB): CORONARY ARTERY BYPASS GRAFTING (CABG) X FOUR BYPASSES USING OPEN LEFT INTERNAL MAMMARY ARTERY, OPEN LEFT RADIAL ARTERY, AND ENDOSOCPIC RIGHT AND LEFT GREATER SAPHENOUS VEIN HARVEST. (N/A) RADIAL ARTERY HARVEST (Left) TRANSESOPHAGEAL ECHOCARDIOGRAM (TEE) (N/A) Subjective: Just extubated. Had a stable night.  Co-ox 68  and CI 2.8 on milrinone 0.25 and NE 9   Objective: Vital signs in last 24 hours: Temp:  [95.9 F (35.5 C)-97.3 F (36.3 C)] 96.8 F (36 C) (06/03 0800) Pulse Rate:  [57-93] 69 (06/03 0800) Cardiac Rhythm: Sinus bradycardia;Atrial paced (06/03 0800) Resp:  [0-21] 15 (06/03 0800) BP: (88-128)/(51-74) 110/69 (06/03 0800) SpO2:  [97 %-100 %] 100 % (06/03 0834) Arterial Line BP: (81-139)/(49-97) 103/97 (06/03 0800) FiO2 (%):  [36 %-50 %] 36 % (06/03 0834) Weight:  [83.8 kg] 83.8 kg (06/03 0615)  Hemodynamic parameters for last 24 hours: PAP: (24-41)/(11-23) 32/13 CO:  [4.2 L/min-6.9 L/min] 5.3 L/min CI:  [2.3 L/min/m2-3.7 L/min/m2] 2.8 L/min/m2  Intake/Output from previous day: 06/02 0701 - 06/03 0700 In: 7873.8 [I.V.:3759.3; Blood:1437.3; NG/GT:60; IV Piggyback:2617.2] Out: 46 [Urine:2870; Emesis/NG output:30; Chest Tube:880] Intake/Output this shift: Total I/O In: 477.5 [I.V.:75.5; Blood:315; IV Piggyback:87] Out: 30 [Urine:30]  General appearance: alert and cooperative Neurologic: intact Heart: regular rate and rhythm, S1, S2 normal, no murmur Lungs: clear to auscultation bilaterally Extremities: edema moderate Wound: dressings dry  Lab Results: Recent Labs    04/12/22 2116 04/12/22 2124 04/13/22 0359 04/13/22 0406  WBC 5.6  --   --  6.1  HGB 7.3*   < > 6.8* 7.3*  HCT 21.2*   < > 20.0* 20.3*  PLT 129*  --   --  121*   < > = values in this interval not displayed.   BMET:  Recent Labs    04/12/22 2116 04/12/22 2124 04/13/22 0359 04/13/22 0406  NA 134*   < > 136 134*  K 4.5   < > 4.5 4.4  CL 105  --   --   104  CO2 24  --   --  25  GLUCOSE 166*  --   --  129*  BUN 7  --   --  8  CREATININE 0.81  --   --  0.74  CALCIUM 6.9*  --   --  7.2*   < > = values in this interval not displayed.    PT/INR:  Recent Labs    04/12/22 1528  LABPROT 18.6*  INR 1.6*   ABG    Component Value Date/Time   PHART 7.350 04/13/2022 0359   HCO3 24.3 04/13/2022 0359   TCO2 26 04/13/2022 0359   ACIDBASEDEF 1.0 04/13/2022 0359   O2SAT 67.8 04/13/2022 0406   CBG (last 3)  Recent Labs    04/13/22 0358 04/13/22 0618 04/13/22 0749  GLUCAP 128* 120* 128*   CXR: clear  ECG: sinus, old IMI, lateral ST elevation.  Assessment/Plan: S/P Procedure(s) (LRB): CORONARY ARTERY BYPASS GRAFTING (CABG) X FOUR BYPASSES USING OPEN LEFT INTERNAL MAMMARY ARTERY, OPEN LEFT RADIAL ARTERY, AND ENDOSOCPIC RIGHT AND LEFT GREATER SAPHENOUS VEIN HARVEST. (N/A) RADIAL ARTERY HARVEST (Left) TRANSESOPHAGEAL ECHOCARDIOGRAM (TEE) (N/A)  POD 1  Hemodynamically stable on milrinone and NE. Preop EF normal. Wean these off today. Rhythm is sinus 60's. Hold off on beta blocker for now. Remove swan and arterial line once off these.  No hx of DM and preop Hgb A1c was 5.5. Will give a dose of Levemir this am to get off insulin drip and  transition to [place].  Volume excess: wt is 23 lbs over preop. Start diuresis.  Expected acute blood loss anemia: This should improve with diuresis. Will start iron and follow.  Chest tube output low. May be able to remove later today.     LOS: 6 days    Gaye Pollack 04/13/2022

## 2022-04-13 NOTE — Procedures (Signed)
Extubation Procedure Note  Patient Details:   Name: Austin Vincent DOB: 1970/12/08 MRN: [id number]   Airway Documentation:    Vent end date: 04/13/22 Vent end time: 0833   Evaluation  O2 sats: stable throughout Complications: No apparent complications Patient did tolerate procedure well. Bilateral Breath Sounds: Clear, Diminished   Yes, pt could speak post extubation.  Pt extubated to 4 l/m [place] per protocol.  Earney Navy 04/13/2022, 8:35 AM

## 2022-04-14 ENCOUNTER — Inpatient Hospital Stay (HOSPITAL_COMMUNITY): Payer: BC Managed Care – PPO

## 2022-04-14 LAB — COOXEMETRY PANEL
Carboxyhemoglobin: 1.2 % (ref 0.5–1.5)
Methemoglobin: 0.9 % (ref 0.0–1.5)
O2 Saturation: 73.4 %
Total hemoglobin: 10 g/dL — ABNORMAL LOW (ref 12.0–16.0)

## 2022-04-14 LAB — CBC
HCT: 23.1 % — ABNORMAL LOW (ref 39.0–52.0)
Hemoglobin: 8 g/dL — ABNORMAL LOW (ref 13.0–17.0)
MCH: 29.5 pg (ref 26.0–34.0)
MCHC: 34.6 g/dL (ref 30.0–36.0)
MCV: 85.2 fL (ref 80.0–100.0)
Platelets: 91 10*3/uL — ABNORMAL LOW (ref 150–400)
RBC: 2.71 MIL/uL — ABNORMAL LOW (ref 4.22–5.81)
RDW: 14.7 % (ref 11.5–15.5)
WBC: 8.6 10*3/uL (ref 4.0–10.5)
nRBC: 0 % (ref 0.0–0.2)

## 2022-04-14 LAB — BASIC METABOLIC PANEL
Anion gap: 6 (ref 5–15)
BUN: 10 mg/dL (ref 6–20)
CO2: 27 mmol/L (ref 22–32)
Calcium: 8.1 mg/dL — ABNORMAL LOW (ref 8.9–10.3)
Chloride: 98 mmol/L (ref 98–111)
Creatinine, Ser: 0.9 mg/dL (ref 0.61–1.24)
GFR, Estimated: >60 mL/min (ref >60)
Glucose, Bld: 106 mg/dL — ABNORMAL HIGH (ref 70–99)
Potassium: 3.9 mmol/L (ref 3.5–5.1)
Sodium: 131 mmol/L — ABNORMAL LOW (ref 135–145)

## 2022-04-14 LAB — GLUCOSE, CAPILLARY
Glucose-Capillary: 114 mg/dL — ABNORMAL HIGH (ref 70–99)
Glucose-Capillary: 116 mg/dL — ABNORMAL HIGH (ref 70–99)
Glucose-Capillary: 149 mg/dL — ABNORMAL HIGH (ref 70–99)

## 2022-04-14 MED ORDER — POTASSIUM CHLORIDE CRYS ER 20 MEQ PO TBCR
20.0000 meq | EXTENDED_RELEASE_TABLET | Freq: Three times a day (TID) | ORAL | Status: AC
  Administered 2022-04-14 (×3): 20 meq via ORAL
  Filled 2022-04-14 (×3): qty 1

## 2022-04-14 MED ORDER — TAMSULOSIN HCL 0.4 MG PO CAPS
0.4000 mg | ORAL_CAPSULE | Freq: Every day | ORAL | Status: DC
  Administered 2022-04-14 – 2022-04-17 (×4): 0.4 mg via ORAL
  Filled 2022-04-14 (×4): qty 1

## 2022-04-14 MED ORDER — ALPRAZOLAM 0.25 MG PO TABS
0.2500 mg | ORAL_TABLET | Freq: Two times a day (BID) | ORAL | Status: DC | PRN
  Administered 2022-04-14 – 2022-04-16 (×3): 0.25 mg via ORAL
  Filled 2022-04-14 (×3): qty 1

## 2022-04-14 MED ORDER — METOPROLOL TARTRATE 12.5 MG HALF TABLET
12.5000 mg | ORAL_TABLET | Freq: Two times a day (BID) | ORAL | Status: DC
  Administered 2022-04-14 – 2022-04-17 (×7): 12.5 mg via ORAL
  Filled 2022-04-14 (×7): qty 1

## 2022-04-14 MED ORDER — FUROSEMIDE 10 MG/ML IJ SOLN
40.0000 mg | Freq: Two times a day (BID) | INTRAMUSCULAR | Status: AC
Start: 2022-04-14 — End: 2022-04-14
  Administered 2022-04-14 (×2): 40 mg via INTRAVENOUS
  Filled 2022-04-14 (×2): qty 4

## 2022-04-14 MED FILL — Heparin Sodium (Porcine) Inj 1000 Unit/ML: Qty: 1000 | Status: AC

## 2022-04-14 MED FILL — Potassium Chloride Inj 2 mEq/ML: INTRAVENOUS | Qty: 40 | Status: AC

## 2022-04-14 MED FILL — Magnesium Sulfate Inj 50%: INTRAMUSCULAR | Qty: 10 | Status: AC

## 2022-04-14 NOTE — Progress Notes (Signed)
2 Days Post-Op Procedure(s) (LRB): CORONARY ARTERY BYPASS GRAFTING (CABG) X FOUR BYPASSES USING OPEN LEFT INTERNAL MAMMARY ARTERY, OPEN LEFT RADIAL ARTERY, AND ENDOSOCPIC RIGHT AND LEFT GREATER SAPHENOUS VEIN HARVEST. (N/A) RADIAL ARTERY HARVEST (Left) TRANSESOPHAGEAL ECHOCARDIOGRAM (TEE) (N/A) Subjective: No specific complaints. Wants sleeping med because he did not sleep much with melatonin.  Objective: Vital signs in last 24 hours: Temp:  [96.8 F (36 C)-100.2 F (37.9 C)] 97.9 F (36.6 C) (06/04 0800) Pulse Rate:  [69-88] 80 (06/04 0745) Cardiac Rhythm: Normal sinus rhythm (06/04 0800) Resp:  [11-27] 16 (06/04 0745) BP: (91-154)/(53-79) 154/72 (06/04 0700) SpO2:  [80 %-100 %] 100 % (06/04 0745) Arterial Line BP: (67-140)/(27-89) 96/89 (06/03 1315) FiO2 (%):  [36 %] 36 % (06/03 0834) Weight:  [80.5 kg] 80.5 kg (06/04 0700)  Hemodynamic parameters for last 24 hours: PAP: (21-44)/(0-20) 36/15  Intake/Output from previous day: 06/03 0701 - 06/04 0700 In: 1536.8 [P.O.:480; I.V.:308; Blood:315; IV Piggyback:433.8] Out: 0923 [RAQTM:2263; Chest Tube:761] Intake/Output this shift: No intake/output data recorded.  General appearance: alert and cooperative Neurologic: intact Heart: regular rate and rhythm, S1, S2 normal, no murmur Lungs: clear to auscultation bilaterally Extremities: edema mild Wound: dressing intact  Lab Results: Recent Labs    04/13/22 1626 04/14/22 0404  WBC 7.2 8.6  HGB 8.3* 8.0*  HCT 24.0* 23.1*  PLT 90* 91*   BMET:  Recent Labs    04/13/22 1626 04/14/22 0404  NA 131* 131*  K 4.2 3.9  CL 100 98  CO2 25 27  GLUCOSE 132* 106*  BUN 9 10  CREATININE 0.95 0.90  CALCIUM 7.6* 8.1*    PT/INR:  Recent Labs    04/12/22 1528  LABPROT 18.6*  INR 1.6*   ABG    Component Value Date/Time   PHART 7.401 04/13/2022 0940   HCO3 23.9 04/13/2022 0940   TCO2 25 04/13/2022 0940   ACIDBASEDEF 1.0 04/13/2022 0940   O2SAT 73.4 04/14/2022 0404   CBG  (last 3)  Recent Labs    04/13/22 2013 04/13/22 2358 04/14/22 0424  GLUCAP 132* 116* 114*   CXR: ok.   Assessment/Plan: S/P Procedure(s) (LRB): CORONARY ARTERY BYPASS GRAFTING (CABG) X FOUR BYPASSES USING OPEN LEFT INTERNAL MAMMARY ARTERY, OPEN LEFT RADIAL ARTERY, AND ENDOSOCPIC RIGHT AND LEFT GREATER SAPHENOUS VEIN HARVEST. (N/A) RADIAL ARTERY HARVEST (Left) TRANSESOPHAGEAL ECHOCARDIOGRAM (TEE) (N/A)  POD 2 Hemodynamically stable in sinus rhythm 80's. Will start low dose Lopressor.  Volume excess: -2700 cc yesterday. Wt down 8 lbs if accurate. He is still 15 lbs over preop wt so will continue IV lasix today. Replace K+  Minimal chest tube output and no air leak. DC all tubes.  DC sleeve and foley.  Glucose under good control and no DM. DC CBG's  Ambulate, IS.   LOS: 7 days    Austin Vincent 04/14/2022

## 2022-04-14 NOTE — Progress Notes (Signed)
Patient ID: Austin Vincent, male   DOB: December 16, 1970, 50 y.o.   MRN: [id number]  TCTS Evening Rounds:  Hemodynamically stable in sinus rhythm.   Diuresed some today.-763 cc  Ambulated.

## 2022-04-15 ENCOUNTER — Inpatient Hospital Stay (HOSPITAL_COMMUNITY): Payer: BC Managed Care – PPO

## 2022-04-15 ENCOUNTER — Encounter (HOSPITAL_COMMUNITY): Payer: Self-pay | Admitting: Cardiothoracic Surgery

## 2022-04-15 DIAGNOSIS — R778 Other specified abnormalities of plasma proteins: Secondary | ICD-10-CM

## 2022-04-15 DIAGNOSIS — I25718 Atherosclerosis of autologous vein coronary artery bypass graft(s) with other forms of angina pectoris: Secondary | ICD-10-CM

## 2022-04-15 LAB — BASIC METABOLIC PANEL
Anion gap: 9 (ref 5–15)
Anion gap: 8 (ref 5–15)
BUN: 16 mg/dL (ref 6–20)
BUN: 17 mg/dL (ref 6–20)
CO2: 27 mmol/L (ref 22–32)
CO2: 24 mmol/L (ref 22–32)
Calcium: 8.8 mg/dL — ABNORMAL LOW (ref 8.9–10.3)
Calcium: 8.6 mg/dL — ABNORMAL LOW (ref 8.9–10.3)
Chloride: 99 mmol/L (ref 98–111)
Chloride: 102 mmol/L (ref 98–111)
Creatinine, Ser: 1.08 mg/dL (ref 0.61–1.24)
Creatinine, Ser: 1.01 mg/dL (ref 0.61–1.24)
GFR, Estimated: >60 mL/min (ref >60)
GFR, Estimated: >60 mL/min (ref >60)
Glucose, Bld: 97 mg/dL (ref 70–99)
Glucose, Bld: 154 mg/dL — ABNORMAL HIGH (ref 70–99)
Potassium: 4.7 mmol/L (ref 3.5–5.1)
Potassium: 4.6 mmol/L (ref 3.5–5.1)
Sodium: 135 mmol/L (ref 135–145)
Sodium: 134 mmol/L — ABNORMAL LOW (ref 135–145)

## 2022-04-15 LAB — GLUCOSE, CAPILLARY
Glucose-Capillary: 135 mg/dL — ABNORMAL HIGH (ref 70–99)
Glucose-Capillary: 94 mg/dL (ref 70–99)
Glucose-Capillary: 137 mg/dL — ABNORMAL HIGH (ref 70–99)

## 2022-04-15 LAB — BPAM RBC
Blood Product Expiration Date: 202306202359
Blood Product Expiration Date: 202306262359
Blood Product Expiration Date: 202306262359
ISSUE DATE / TIME: 202306021421
ISSUE DATE / TIME: 202306030540
Unit Type and Rh: 6200
Unit Type and Rh: 6200
Unit Type and Rh: 6200

## 2022-04-15 LAB — TYPE AND SCREEN
ABO/RH(D): A POS
Antibody Screen: NEGATIVE
Unit division: 0
Unit division: 0
Unit division: 0

## 2022-04-15 LAB — CBC
HCT: 24.5 % — ABNORMAL LOW (ref 39.0–52.0)
Hemoglobin: 8.7 g/dL — ABNORMAL LOW (ref 13.0–17.0)
MCH: 30.5 pg (ref 26.0–34.0)
MCHC: 35.5 g/dL (ref 30.0–36.0)
MCV: 86 fL (ref 80.0–100.0)
Platelets: 109 10*3/uL — ABNORMAL LOW (ref 150–400)
RBC: 2.85 MIL/uL — ABNORMAL LOW (ref 4.22–5.81)
RDW: 14.2 % (ref 11.5–15.5)
WBC: 8.5 10*3/uL (ref 4.0–10.5)
nRBC: 0 % (ref 0.0–0.2)

## 2022-04-15 MED ORDER — FUROSEMIDE 10 MG/ML IJ SOLN
40.0000 mg | Freq: Every day | INTRAMUSCULAR | Status: AC
  Administered 2022-04-15 – 2022-04-16 (×2): 40 mg via INTRAVENOUS
  Filled 2022-04-15 (×2): qty 4

## 2022-04-15 MED ORDER — INSULIN ASPART 100 UNIT/ML IJ SOLN: 0.0000 [IU] | Freq: Three times a day (TID) | INTRAMUSCULAR | Status: DC

## 2022-04-15 MED ORDER — POTASSIUM CHLORIDE CRYS ER 20 MEQ PO TBCR
20.0000 meq | EXTENDED_RELEASE_TABLET | Freq: Every day | ORAL | Status: AC
  Administered 2022-04-15 – 2022-04-17 (×3): 20 meq via ORAL
  Filled 2022-04-15 (×3): qty 1

## 2022-04-15 MED ORDER — INSULIN ASPART 100 UNIT/ML IJ SOLN: 0.0000 [IU] | Freq: Every day | INTRAMUSCULAR | Status: DC

## 2022-04-15 NOTE — Progress Notes (Addendum)
TCTS DAILY ICU PROGRESS NOTE                   [address]            [place] [address]          [phone]   3 Days Post-Op Procedure(s) (LRB): CORONARY ARTERY BYPASS GRAFTING (CABG) X FOUR BYPASSES USING OPEN LEFT INTERNAL MAMMARY ARTERY, OPEN LEFT RADIAL ARTERY, AND ENDOSOCPIC RIGHT AND LEFT GREATER SAPHENOUS VEIN HARVEST. (N/A) RADIAL ARTERY HARVEST (Left) TRANSESOPHAGEAL ECHOCARDIOGRAM (TEE) (N/A)  Total Length of Stay:  LOS: 8 days   Subjective: Patient's wife at bedside. Patient passing some flatus but no bowel movement yet  Objective: Vital signs in last 24 hours: Temp:  [97.9 F (36.6 C)-99 F (37.2 C)] 98.5 F (36.9 C) (06/04 2300) Pulse Rate:  [60-104] 75 (06/05 0700) Cardiac Rhythm: Normal sinus rhythm (06/04 2000) Resp:  [5-28] 17 (06/05 0700) BP: (90-137)/(30-103) 118/68 (06/05 0700) SpO2:  [90 %-100 %] 90 % (06/05 0700) Weight:  [78.5 kg] 78.5 kg (06/05 0600)  Filed Weights   04/13/22 0615 04/14/22 0700 04/15/22 0600  Weight: 83.8 kg 80.5 kg 78.5 kg    Weight change: -1.982 kg   Hemodynamic parameters for last 24 hours:    Intake/Output from previous day: 06/04 0701 - 06/05 0700 In: 819.9 [P.O.:720; IV Piggyback:99.9] Out: 3178 [Urine:3058; Chest Tube:120]  Intake/Output this shift: No intake/output data recorded.  Current Meds: Scheduled Meds:  sodium chloride   Intravenous Once   acetaminophen  1,000 mg Oral Q6H   Or   acetaminophen (TYLENOL) oral liquid 160 mg/5 mL  1,000 mg Per Tube Q6H   aspirin EC  325 mg Oral Daily   Or   aspirin  324 mg Per Tube Daily   atorvastatin  80 mg Oral Daily   bisacodyl  10 mg Oral Daily   Or   bisacodyl  10 mg Rectal Daily   Chlorhexidine Gluconate Cloth  6 each Topical Daily   docusate sodium  200 mg Oral Daily   ezetimibe  10 mg Oral Daily   ferrous [id number] C-folic acid  1 capsule Oral BID PC   furosemide  40 mg Intravenous Daily   guaiFENesin  600 mg Oral BID    insulin aspart  0-15 Units Subcutaneous TID WC   insulin aspart  0-5 Units Subcutaneous QHS   insulin detemir  20 Units Subcutaneous Daily   isosorbide mononitrate  15 mg Oral Daily   melatonin  5 mg Oral QHS   metoprolol tartrate  12.5 mg Oral BID   pantoprazole  40 mg Oral Daily   polycarbophil  625 mg Oral Daily   potassium chloride  20 mEq Oral Daily   sodium chloride flush  3 mL Intravenous Q12H   tamsulosin  0.4 mg Oral Daily   Continuous Infusions:  sodium chloride Stopped (04/13/22 1323)   sodium chloride     sodium chloride     lactated ringers Stopped (04/12/22 1515)   PRN Meds:.sodium chloride, ALPRAZolam, alum & mag hydroxide-simeth, metoprolol tartrate, ondansetron (ZOFRAN) IV, oxyCODONE, sodium chloride flush, traMADol  General appearance: alert, cooperative, and no distress Neurologic: intact Heart: RRR Lungs: Slightly diminished bibasilar breath sounds L>R Abdomen: Soft, non tender, bowel sounds present Extremities: Mild LE edema. Ecchymosis bilateral thighs Wound: Aquacel removed and sternal wound is clean and dry. Small right thigh wound with bloody drainage. Left lower leg and remaining right LE wounds clean and dry  Lab Results:  CBC: Recent Labs    04/14/22 0404 04/15/22 0510  WBC 8.6 8.5  HGB 8.0* 8.7*  HCT 23.1* 24.5*  PLT 91* 109*   BMET:  Recent Labs    04/14/22 0404 04/15/22 0510  NA 131* 135  K 3.9 4.7  CL 98 99  CO2 27 27  GLUCOSE 106* 97  BUN 10 16  CREATININE 0.90 1.08  CALCIUM 8.1* 8.8*    CMET: Lab Results  Component Value Date   WBC 8.5 04/15/2022   HGB 8.7 (L) 04/15/2022   HCT 24.5 (L) 04/15/2022   PLT 109 (L) 04/15/2022   GLUCOSE 97 04/15/2022   CHOL 126 03/27/2022   TRIG 59 03/27/2022   HDL 45 03/27/2022   LDLDIRECT 190.7 03/24/2013   LDLCALC 68 03/27/2022   ALT 112 (H) 04/10/2022   AST 100 (H) 04/10/2022   NA 135 04/15/2022   K 4.7 04/15/2022   CL 99 04/15/2022   CREATININE 1.08 04/15/2022   BUN 16 04/15/2022    CO2 27 04/15/2022   TSH 0.981 04/10/2022   INR 1.6 (H) 04/12/2022   HGBA1C 5.5 04/10/2022      PT/INR:  Recent Labs    04/12/22 1528  LABPROT 18.6*  INR 1.6*   Radiology: DG CHEST PORT 1 VIEW  Result Date: 04/15/2022 CLINICAL DATA:  History of coronary stent and CABG. EXAM: PORTABLE CHEST 1 VIEW COMPARISON:  April 14, 2022 FINDINGS: The heart size and mediastinal contours are stable. Heart size is enlarged. No definite pleural line is identified in the right apex to suggest pneumothorax. Mild atelectasis of the left lung base is noted. Previously noted life supporting devices have been removed. The visualized skeletal structures are stable. IMPRESSION: No definite pleural line is identified in the right apex to suggest pneumothorax. Mild atelectasis of the left lung base. Electronically Signed   By: Abelardo Diesel M.D.   On: 04/15/2022 06:38     Assessment/Plan: S/P Procedure(s) (LRB): CORONARY ARTERY BYPASS GRAFTING (CABG) X FOUR BYPASSES USING OPEN LEFT INTERNAL MAMMARY ARTERY, OPEN LEFT RADIAL ARTERY, AND ENDOSOCPIC RIGHT AND LEFT GREATER SAPHENOUS VEIN HARVEST. (N/A) RADIAL ARTERY HARVEST (Left) TRANSESOPHAGEAL ECHOCARDIOGRAM (TEE) (N/A) CV-SR. On Lopressor 12.5 mg bid and Imdur 15 mg daily. Pulmonary-on 3 liters via [place]. Wean as able. CXR this am shows left base atelectasis, cardiomegaly. Encourage incentive spirometer Volume overload-on Lasix 40 mg IV daily Expected post op blood loss anemia-H and H stable at 8.7 and 24.5. On Trinsicon 5. CBGs 116/114/149. On Insulin. Pre op HGA1C 6.1. He likely has pre diabetes. Will need surveillance as an outpatient and will provide nutritional information with discharge paperwork. 6. Thrombocytopenia-platelets this am up to 109,000 7. EPW removal in am 8. Hope to transfer to [place] PA-C 04/15/2022 7:58 AM   Progressing well- will transfer to 4E and cont iv lasix another day Remove EPWs tomorrow  patient examined and  medical record reviewed,agree with above note. [name] 04/15/2022

## 2022-04-15 NOTE — Progress Notes (Signed)
S/p 4 vessel CABG. Post op ECG on 04/13/2022 c/w pericarditis. Stable post CABG: Hgb 8.7; Cr. 1.o8

## 2022-04-16 ENCOUNTER — Inpatient Hospital Stay (HOSPITAL_COMMUNITY): Payer: BC Managed Care – PPO

## 2022-04-16 LAB — CBC
HCT: 23.8 % — ABNORMAL LOW (ref 39.0–52.0)
Hemoglobin: 7.9 g/dL — ABNORMAL LOW (ref 13.0–17.0)
MCH: 28.9 pg (ref 26.0–34.0)
MCHC: 33.2 g/dL (ref 30.0–36.0)
MCV: 87.2 fL (ref 80.0–100.0)
Platelets: 144 10*3/uL — ABNORMAL LOW (ref 150–400)
RBC: 2.73 MIL/uL — ABNORMAL LOW (ref 4.22–5.81)
RDW: 14 % (ref 11.5–15.5)
WBC: 7.7 10*3/uL (ref 4.0–10.5)
nRBC: 0 % (ref 0.0–0.2)

## 2022-04-16 LAB — BASIC METABOLIC PANEL
Anion gap: 5 (ref 5–15)
BUN: 15 mg/dL (ref 6–20)
CO2: 27 mmol/L (ref 22–32)
Calcium: 8.4 mg/dL — ABNORMAL LOW (ref 8.9–10.3)
Chloride: 103 mmol/L (ref 98–111)
Creatinine, Ser: 1 mg/dL (ref 0.61–1.24)
GFR, Estimated: >60 mL/min (ref >60)
Glucose, Bld: 82 mg/dL (ref 70–99)
Potassium: 4.1 mmol/L (ref 3.5–5.1)
Sodium: 135 mmol/L (ref 135–145)

## 2022-04-16 LAB — GLUCOSE, CAPILLARY: Glucose-Capillary: 82 mg/dL (ref 70–99)

## 2022-04-16 MED ORDER — FUROSEMIDE 40 MG PO TABS
40.0000 mg | ORAL_TABLET | Freq: Every day | ORAL | Status: DC
  Administered 2022-04-17: 40 mg via ORAL
  Filled 2022-04-16: qty 1

## 2022-04-16 MED ORDER — ASPIRIN 81 MG PO TBEC
81.0000 mg | DELAYED_RELEASE_TABLET | Freq: Every day | ORAL | Status: DC
  Administered 2022-04-17: 81 mg via ORAL
  Filled 2022-04-16: qty 1

## 2022-04-16 MED ORDER — CLOPIDOGREL BISULFATE 75 MG PO TABS
75.0000 mg | ORAL_TABLET | Freq: Every day | ORAL | Status: DC
  Administered 2022-04-17: 75 mg via ORAL
  Filled 2022-04-16: qty 1

## 2022-04-16 MED ORDER — FERROUS SULFATE 325 (65 FE) MG PO TBEC: 325.0000 mg | DELAYED_RELEASE_TABLET | Freq: Every day | ORAL | 0 refills | Status: DC

## 2022-04-16 NOTE — Plan of Care (Signed)
  Problem: Education: Goal: Knowledge of General Education information will improve Description: Including pain rating scale, medication(s)/side effects and non-pharmacologic comfort measures Outcome: Completed/Met   Problem: Health Behavior/Discharge Planning: Goal: Ability to manage health-related needs will improve Outcome: Completed/Met   Problem: Clinical Measurements: Goal: Ability to maintain clinical measurements within normal limits will improve Outcome: Completed/Met Goal: Will remain free from infection Outcome: Completed/Met Goal: Diagnostic test results will improve Outcome: Completed/Met Goal: Respiratory complications will improve Outcome: Completed/Met Goal: Cardiovascular complication will be avoided Outcome: Completed/Met   Problem: Activity: Goal: Risk for activity intolerance will decrease Outcome: Completed/Met   Problem: Nutrition: Goal: Adequate nutrition will be maintained Outcome: Completed/Met   Problem: Coping: Goal: Level of anxiety will decrease Outcome: Completed/Met   Problem: Elimination: Goal: Will not experience complications related to bowel motility Outcome: Completed/Met Goal: Will not experience complications related to urinary retention Outcome: Completed/Met   Problem: Pain Managment: Goal: General experience of comfort will improve Outcome: Completed/Met   Problem: Safety: Goal: Ability to remain free from injury will improve Outcome: Completed/Met   Problem: Skin Integrity: Goal: Risk for impaired skin integrity will decrease Outcome: Completed/Met   Problem: Education: Goal: Understanding of CV disease, CV risk reduction, and recovery process will improve Outcome: Completed/Met Goal: Individualized Educational Video(s) Outcome: Completed/Met

## 2022-04-16 NOTE — Progress Notes (Signed)
Mobility Specialist: Progress Note   04/16/22 1407  Mobility  Activity Ambulated independently in hallway  Level of Assistance Independent  Distance Ambulated (ft) 1200 ft  Activity Response Tolerated well  $Mobility charge 1 Mobility   During Mobility: 94 HR Post-Mobility: 76 HR  Received pt in bed having no complaints and agreeable to mobility. Asymptomatic throughout ambulation, returned back to bed w/ call bell in reach and all needs met.  [facility] Austin Vincent Mobility Specialist Mobility Specialist 5 North: [phone] Mobility Specialist 6 North: [phone]

## 2022-04-16 NOTE — Progress Notes (Signed)
CARDIAC REHAB PHASE I   PRE:  Rate/Rhythm: 83 NSR  BP:  Sitting: 121/73      SaO2:   MODE:  Ambulation: 790 ft   POST:  Rate/Rhythm: 92 NSR  BP:  Sitting:  114/79      SaO2:    Seen pt from 1145-1222, pt was ambulated through hallways with no assistance. Pt was returned to room and educated on sternal precautions, IS use, restrictions, ex guidelines, diet, and CRPII. Pt declined CRPII b/c is active.   Christen Bame  12:20 PM 04/16/2022

## 2022-04-16 NOTE — Progress Notes (Signed)
Epicardial pacing wires removed per MD order without difficulty.  Patient educated on frequent BP's and one hour bedrest.  Will continue to monitor.

## 2022-04-16 NOTE — Progress Notes (Addendum)
      [address]       [place] [address]             [phone]        4 Days Post-Op Procedure(s) (LRB): CORONARY ARTERY BYPASS GRAFTING (CABG) X FOUR BYPASSES USING OPEN LEFT INTERNAL MAMMARY ARTERY, OPEN LEFT RADIAL ARTERY, AND ENDOSOCPIC RIGHT AND LEFT GREATER SAPHENOUS VEIN HARVEST. (N/A) RADIAL ARTERY HARVEST (Left) TRANSESOPHAGEAL ECHOCARDIOGRAM (TEE) (N/A)  Subjective: Mild numbness lateral left thumb (radial artery harvest). He has had 2 bowel movements.  Objective: Vital signs in last 24 hours: Temp:  [98.1 F (36.7 C)-98.7 F (37.1 C)] 98.1 F (36.7 C) (06/06 0618) Pulse Rate:  [64-86] 85 (06/06 0618) Cardiac Rhythm: Normal sinus rhythm (06/05 2015) Resp:  [15-21] 20 (06/06 0618) BP: (99-131)/(34-78) 124/73 (06/06 0618) SpO2:  [89 %-97 %] 92 % (06/06 0618) Weight:  [75.6 kg] 75.6 kg (06/06 0618)  Pre op weight 73.4 kg Current Weight  04/16/22 75.6 kg      Intake/Output from previous day: 06/05 0701 - 06/06 0700 In: 240 [P.O.:240] Out: 800 [Urine:800]   Physical Exam:  Cardiovascular: RRR Pulmonary: Slightly diminished bibasilar breath sounds Abdomen: Soft, non tender, bowel sounds present. Extremities: Mild bilateral lower extremity edema. Ecchymosis bilateral LEs. Motor/sensory intact Wounds: Clean and dry.  No erythema or signs of infection. Small wound inner right thigh with blood ooze.  Lab Results: CBC: Recent Labs    04/15/22 0510 04/16/22 0236  WBC 8.5 7.7  HGB 8.7* 7.9*  HCT 24.5* 23.8*  PLT 109* 144*   BMET:  Recent Labs    04/15/22 0843 04/16/22 0236  NA 134* 135  K 4.6 4.1  CL 102 103  CO2 24 27  GLUCOSE 154* 82  BUN 17 15  CREATININE 1.01 1.00  CALCIUM 8.6* 8.4*    PT/INR:  Lab Results  Component Value Date   INR 1.6 (H) 04/12/2022   ABG:  INR: Will add last result for INR, ABG once components are confirmed Will add last 4 CBG results once components are  confirmed  Assessment/Plan: CV-SR. On Lopressor 12.5 mg bid and Imdur 15 mg daily. Pulmonary-on room air. CXR this am shows left base atelectasis, cardiomegaly. Encourage incentive spirometer Volume overload-on Lasix 40 mg IV daily Expected post op blood loss anemia-H and H stable at 7.9 and 23.8. On Trinsicon 5. CBGs 94/137/82. On Insulin. Pre op HGA1C 6.1. He likely has pre diabetes. Stop accu checks and SS PRN. Will need surveillance as an outpatient and will provide nutritional information with discharge paperwork. 6. Thrombocytopenia-platelets this am up to 144,000 7. EPW to be removed today 8. Likely discharge in am  Donielle M ZimmermanPA-C 7:01 AM   Agree with assessment and plan- should be ready for DC tomorrow. Home on plavix 75 with 81 asa Lasix 40 mg daily for 1 week DC instructions reviewed with patient  patient examined and medical record reviewed,agree with above note. [name] 04/16/2022

## 2022-04-17 ENCOUNTER — Other Ambulatory Visit (HOSPITAL_COMMUNITY): Payer: Self-pay

## 2022-04-17 MED ORDER — ATORVASTATIN CALCIUM 80 MG PO TABS: 80.0000 mg | ORAL_TABLET | Freq: Every day | ORAL | Status: DC

## 2022-04-17 MED ORDER — CLOPIDOGREL BISULFATE 75 MG PO TABS
75.0000 mg | ORAL_TABLET | Freq: Every day | ORAL | 3 refills | Status: DC
  Filled 2022-04-17: qty 30, 30d supply, fill #0

## 2022-04-17 MED ORDER — FUROSEMIDE 40 MG PO TABS
40.0000 mg | ORAL_TABLET | Freq: Every day | ORAL | 0 refills | Status: DC
  Filled 2022-04-17: qty 7, 7d supply, fill #0

## 2022-04-17 MED ORDER — METOPROLOL TARTRATE 25 MG PO TABS
12.5000 mg | ORAL_TABLET | Freq: Two times a day (BID) | ORAL | 3 refills | Status: DC
  Filled 2022-04-17: qty 60, 60d supply, fill #0

## 2022-04-17 MED ORDER — TRAMADOL HCL 50 MG PO TABS
50.0000 mg | ORAL_TABLET | Freq: Four times a day (QID) | ORAL | 0 refills | Status: DC | PRN
  Filled 2022-04-17: qty 28, 7d supply, fill #0

## 2022-04-17 MED ORDER — ISOSORBIDE MONONITRATE ER 30 MG PO TB24
15.0000 mg | ORAL_TABLET | Freq: Every day | ORAL | 0 refills | Status: DC
  Filled 2022-04-17: qty 15, 30d supply, fill #0

## 2022-04-17 MED ORDER — POTASSIUM CHLORIDE CRYS ER 20 MEQ PO TBCR
20.0000 meq | EXTENDED_RELEASE_TABLET | Freq: Every day | ORAL | 0 refills | Status: DC
  Filled 2022-04-17: qty 7, 7d supply, fill #0

## 2022-04-17 NOTE — Progress Notes (Addendum)
      [address]       [place] [address]             [phone]      5 Days Post-Op Procedure(s) (LRB): CORONARY ARTERY BYPASS GRAFTING (CABG) X FOUR BYPASSES USING OPEN LEFT INTERNAL MAMMARY ARTERY, OPEN LEFT RADIAL ARTERY, AND ENDOSOCPIC RIGHT AND LEFT GREATER SAPHENOUS VEIN HARVEST. (N/A) RADIAL ARTERY HARVEST (Left) TRANSESOPHAGEAL ECHOCARDIOGRAM (TEE) (N/A)  Subjective:  Patient states he is excited.  He gets to go home today.  He has no complaints.  + ambulation  + BM  Objective: Vital signs in last 24 hours: Temp:  [97.9 F (36.6 C)-98.5 F (36.9 C)] 97.9 F (36.6 C) (06/07 0448) Pulse Rate:  [73-89] 77 (06/07 0448) Cardiac Rhythm: Normal sinus rhythm (06/06 1944) Resp:  [15-20] 16 (06/07 0448) BP: (114-139)/(69-79) 123/73 (06/07 0448) SpO2:  [90 %-97 %] 94 % (06/07 0448) Weight:  [73.3 kg] 73.3 kg (06/07 0448)  Intake/Output from previous day: 06/06 0701 - 06/07 0700 In: -  Out: 400 [Urine:400]  General appearance: alert, cooperative, and no distress Heart: regular rate and rhythm Lungs: clear to auscultation bilaterally Abdomen: soft, non-tender; bowel sounds normal; no masses,  no organomegaly Extremities: no edema Wound: clean and dry, staples in LUE, extensive ecchymosis BLE  Lab Results: Recent Labs    04/15/22 0510 04/16/22 0236  WBC 8.5 7.7  HGB 8.7* 7.9*  HCT 24.5* 23.8*  PLT 109* 144*   BMET:  Recent Labs    04/15/22 0843 04/16/22 0236  NA 134* 135  K 4.6 4.1  CL 102 103  CO2 24 27  GLUCOSE 154* 82  BUN 17 15  CREATININE 1.01 1.00  CALCIUM 8.6* 8.4*    PT/INR: No results for input(s): LABPROT, INR in the last 72 hours. ABG    Component Value Date/Time   PHART 7.401 04/13/2022 0940   HCO3 23.9 04/13/2022 0940   TCO2 25 04/13/2022 0940   ACIDBASEDEF 1.0 04/13/2022 0940   O2SAT 73.4 04/14/2022 0404   CBG (last 3)  Recent Labs    04/15/22 1631 04/15/22 2157 04/16/22 0527  GLUCAP 94 137* 82     Assessment/Plan: S/P Procedure(s) (LRB): CORONARY ARTERY BYPASS GRAFTING (CABG) X FOUR BYPASSES USING OPEN LEFT INTERNAL MAMMARY ARTERY, OPEN LEFT RADIAL ARTERY, AND ENDOSOCPIC RIGHT AND LEFT GREATER SAPHENOUS VEIN HARVEST. (N/A) RADIAL ARTERY HARVEST (Left) TRANSESOPHAGEAL ECHOCARDIOGRAM (TEE) (N/A)  CV- remains in NSR, BP stable- continue Lopressor, Imdur Pulm- off oxygen, continue IS at discharge Renal- creatinine has been stable, weight is at baseline, taper Lasix Pre-diabetic- needs outpatient follow up Dispo- patient stable, will d/c home today.   LOS: 10 days   Ellwood Handler, PA-C 04/17/2022 Ready for DC  Instructions reviewed with patient patient examined and medical record reviewed,agree with above note. [name] 04/17/2022

## 2022-04-17 NOTE — TOC Transition Note (Signed)
Transition of Care (TOC) - CM/SW Discharge Note Marvetta Gibbons RN, BSN Transitions of Care Unit 4E- RN Case Manager See Treatment Team for direct phone #    Patient Details  Name: Austin Vincent MRN: [id number] Date of Birth: 02/21/1971  Transition of Care [facility]) CM/SW Contact:  Dawayne Patricia, RN Phone Number: 04/17/2022, 11:28 AM   Clinical Narrative:    Pt s/p CABG, stable for transition home today. Transition of Care Department [facility]) has reviewed patient and no TOC needs have been identified at this time.    Final next level of care: Home/Self Care Barriers to Discharge: No Barriers Identified   Patient Goals and CMS Choice    N/A    Discharge Placement               Home        Discharge Plan and Services     Post Acute Care Choice: NA          DME Arranged: N/A DME Agency: NA       HH Arranged: NA HH Agency: NA        Social Determinants of Health (SDOH) Interventions     Readmission Risk Interventions    04/17/2022   11:28 AM  Readmission Risk Prevention Plan  Transportation Screening Complete  PCP or Specialist Appt within 5-7 Days Complete  Home Care Screening Complete  Medication Review (RN CM) Complete

## 2022-04-17 NOTE — Progress Notes (Signed)
Order received to discharge patient.  Telemetry monitor removed and CCMD notified.  PIV x3 removed. Chest tube sutures removed without difficulty.  Austin Vincent pharmacy delivered patient's DC meds to room.  Discharge instructions, follow up, medications and instructions for their use discussed with patient.

## 2022-04-17 NOTE — Progress Notes (Signed)
Chest tube sutures removed per MD order without difficulty. 

## 2022-04-18 ENCOUNTER — Telehealth: Payer: Self-pay | Admitting: Cardiology

## 2022-04-18 ENCOUNTER — Other Ambulatory Visit: Payer: Self-pay | Admitting: *Deleted

## 2022-04-18 ENCOUNTER — Other Ambulatory Visit (HOSPITAL_COMMUNITY): Payer: Self-pay

## 2022-04-18 DIAGNOSIS — J984 Other disorders of lung: Secondary | ICD-10-CM

## 2022-04-18 MED FILL — Mannitol IV Soln 20%: INTRAVENOUS | Qty: 500 | Status: AC

## 2022-04-18 MED FILL — Sodium Chloride IV Soln 0.9%: INTRAVENOUS | Qty: 3000 | Status: AC

## 2022-04-18 MED FILL — Sodium Bicarbonate IV Soln 8.4%: INTRAVENOUS | Qty: 50 | Status: AC

## 2022-04-18 MED FILL — Heparin Sodium (Porcine) Inj 1000 Unit/ML: INTRAMUSCULAR | Qty: 10 | Status: AC

## 2022-04-18 MED FILL — Electrolyte-R (PH 7.4) Solution: INTRAVENOUS | Qty: 6000 | Status: AC

## 2022-04-18 MED FILL — Lidocaine HCl Local Soln Prefilled Syringe 100 MG/5ML (2%): INTRAMUSCULAR | Qty: 5 | Status: AC

## 2022-04-18 NOTE — Telephone Encounter (Signed)
   Pre-operative Risk Assessment    Patient Name: Austin Vincent  DOB: 1970/12/04 MRN: [id number]      Request for Surgical Clearance    Procedure:   Thyroidectomy  Date of Surgery:  Clearance 05/18/22                                 Surgeon:  Dr. Thea Silversmith  Surgeon's Group or Practice Name:  [facility] Phone number:  [phone] Fax number:  [phone]   Type of Clearance Requested:   - Medical  - Pharmacy:  Hold TBD  by Cardiology   Type of Anesthesia:  General    Additional requests/questions:    Crist Infante   04/18/2022, 10:21 AM

## 2022-04-19 NOTE — Telephone Encounter (Signed)
Pt has appt with Dr. Stanford Breed 05/07/22. Will add pre op clearance needed to appt notes. Will send FYI to requesting office the pt has appt

## 2022-04-19 NOTE — Telephone Encounter (Signed)
   Name: Austin Vincent  DOB: 02/02/1971  MRN: [id number]  Primary Cardiologist: Kirk Ruths, MD  Chart reviewed as part of pre-operative protocol coverage. Because of [name]'s past medical history and time since last visit, he will require a follow-up in-office visit in order to better assess preoperative cardiovascular risk.  It appears that patient is undergoing evaluation for possible CABG.  Pre-op covering staff: - Please schedule appointment and call patient to inform them. If patient already had an upcoming appointment within acceptable timeframe, please add "pre-op clearance" to the appointment notes so provider is aware. - Please contact requesting surgeon's office via preferred method (i.e, phone, fax) to inform them of need for appointment prior to surgery.  Lenna Sciara, NP  04/19/2022, 9:52 AM

## 2022-04-20 ENCOUNTER — Encounter: Payer: Self-pay | Admitting: Cardiology

## 2022-04-20 ENCOUNTER — Telehealth: Payer: Self-pay | Admitting: Thoracic Surgery (Cardiothoracic Vascular Surgery)

## 2022-04-20 DIAGNOSIS — I2510 Atherosclerotic heart disease of native coronary artery without angina pectoris: Secondary | ICD-10-CM

## 2022-04-20 MED ORDER — HYDROCODONE-IBUPROFEN 5-200 MG PO TABS: 1.0000 | ORAL_TABLET | Freq: Four times a day (QID) | ORAL | 0 refills | Status: AC | PRN

## 2022-04-20 NOTE — Telephone Encounter (Signed)
      [address]       ,[place] [address]             [phone]      Austin Vincent called with c/o severe pain to right of sternum. Has been present but worse overnight.  Tramadol ineffective.  He took a hydrocodone/ ibuprofen tablet he had at home and had relief. Requesting prescription for that.  He is on Plavix- discussed risk of Gi bleed with Plavix and NSAIDs. Low dose ibuprofen at 200 mg.   Will give prescription for hydrocodone/ ibuprofen 5/200, 1 PO Q6 PRN, 20 , no refills. Will usesparingly for severe pain. Will not mix with tramadol. Will stop if any signs of GI upset  Revonda Standard. Roxan Hockey, MD [facility] [phone]

## 2022-04-22 MED ORDER — EZETIMIBE 10 MG PO TABS: 10.0000 mg | ORAL_TABLET | Freq: Every day | ORAL | 3 refills | Status: DC

## 2022-04-23 NOTE — Progress Notes (Unsigned)
HPI: FU coronary artery disease. Recently diagnosed with medullary thyroid cancer; total thyroidectomy planned.  Patient admitted with syncopal episode May 2023.  Troponin elevated.  Echocardiogram showed distal septal/apical hypokinesis, ejection fraction 50 to 55%, mild left atrial enlargement.  Cardiac catheterization revealed severe three-vessel coronary artery disease with ejection fraction 50 to 55%.  Preoperative carotid Dopplers May 2023 showed near normal bilaterally.  CTA May 2023 showed lung nodule and follow-up recommended in 3 months.  Patient underwent coronary artery bypass and graft on April 12, 2022 with LIMA to the LAD, saphenous vein graft to the PDA, left radial artery free graft to the diagonal and saphenous vein graft to the ramus intermedius.  Since I last saw him,   Current Outpatient Medications  Medication Sig Dispense Refill   aspirin 81 MG tablet Take 81 mg by mouth daily.      atorvastatin (LIPITOR) 80 MG tablet Take 1 tablet (80 mg total) by mouth daily.     clopidogrel (PLAVIX) 75 MG tablet Take 1 tablet (75 mg total) by mouth daily. 30 tablet 3   ezetimibe (ZETIA) 10 MG tablet Take 1 tablet (10 mg total) by mouth daily. 90 tablet 3   ferrous sulfate 325 (65 FE) MG EC tablet Take 1 tablet (325 mg total) by mouth daily with breakfast. For one month then stop. If develops constipation, may stop sooner  0   furosemide (LASIX) 40 MG tablet Take 1 tablet (40 mg total) by mouth daily. 7 tablet 0   hydrocodone-ibuprofen (VICOPROFEN) 5-200 MG tablet Take 1 tablet by mouth every 6 (six) hours as needed for up to 7 days for pain. 20 tablet 0   isosorbide mononitrate (IMDUR) 30 MG 24 hr tablet Take 0.5 tablets (15 mg total) by mouth daily. 15 tablet 0   metoprolol tartrate (LOPRESSOR) 25 MG tablet Take 1/2 tablets (12.5 mg total) by mouth 2 (two) times daily. 60 tablet 3   potassium chloride SA (KLOR-CON M) 20 MEQ tablet Take 1 tablet (20 mEq total) by mouth daily. 7 tablet 0    Simethicone (GAS-X PO) Take 1 tablet by mouth daily as needed (gas).     tamsulosin (FLOMAX) 0.4 MG CAPS capsule Take 1 capsule (0.4 mg total) by mouth daily. (Patient taking differently: Take 0.4 mg by mouth daily as needed (when needed to pass urine).) 14 capsule 0   No current facility-administered medications for this visit.     Past Medical History:  Diagnosis Date   CAD (coronary artery disease)    a. s/p MI @ age 82 with PCI of the RCA;  b. CTO of the LAD;  c. 11/2013 NSTEMI/PCI: LM nl, LAD 100 L->L collats, LCX 50p, OM2 80p, OM1 60ost, RCA 100p (3.5x38 Promus DES), RPL 99/53mPTCA only, EF 40-50%;  c. 11/2013 Echo: EF 50-55%, Gr1 DD, mild MR.   HYPERLIPIDEMIA    HYPERTENSION    Kidney stone 11/2021   MITRAL REGURGITATION    Myocardial infarction ([facility]    at age [age]  Tobacco abuse     Past Surgical History:  Procedure Laterality Date   COLONOSCOPY  02/12/2022   CORONARY ARTERY BYPASS GRAFT N/A 04/12/2022   Procedure: CORONARY ARTERY BYPASS GRAFTING (CABG) X FOUR BYPASSES USING OPEN LEFT INTERNAL MAMMARY ARTERY, OPEN LEFT RADIAL ARTERY, AND ENDOSOCPIC RIGHT AND LEFT GREATER SAPHENOUS VEIN HARVEST.;  Surgeon: VDahlia Byes MD;  [place] [facility]  Service: Open Heart Surgery;  Laterality: N/A;   CORONARY STENT PLACEMENT  LEFT HEART CATH AND CORONARY ANGIOGRAPHY N/A 04/09/2022   Procedure: LEFT HEART CATH AND CORONARY ANGIOGRAPHY;  Surgeon: Martinique, Peter M, MD;  Location: [place] CV LAB;  Service: Cardiovascular;  Laterality: N/A;   LEFT HEART CATHETERIZATION WITH CORONARY ANGIOGRAM N/A 12/01/2013   Procedure: LEFT HEART CATHETERIZATION WITH CORONARY ANGIOGRAM;  Surgeon: Leonie Man, MD;  Location: [facility] CATH LAB;  Service: Cardiovascular;  Laterality: N/A;   LITHOTRIPSY  2003   RADIAL ARTERY HARVEST Left 04/12/2022   Procedure: RADIAL ARTERY HARVEST;  Surgeon: [name], MD;  Location: [facility];  Service: Open Heart Surgery;  Laterality: Left;   TEE WITHOUT  CARDIOVERSION N/A 04/12/2022   Procedure: TRANSESOPHAGEAL ECHOCARDIOGRAM (TEE);  Surgeon: [name], MD;  Location: [facility];  Service: Open Heart Surgery;  Laterality: N/A;    Social History   Socioeconomic History   Marital status: Divorced    Spouse name: Not on file   Number of children: 2   Years of education: Not on file   Highest education level: Bachelor's degree (e.g., BA, AB, BS)  Occupational History   Occupation: Hospital doctor    Comment: Actor  Tobacco Use   Smoking status: Former    Types: Cigarettes    Quit date: 2021    Years since quitting: 2.4    Passive exposure: Past (younger)   Smokeless tobacco: Never   Tobacco comments:    Nicotine pouches- 10/day--no tobacco  Vaping Use   Vaping Use: Former   Quit date: 11/25/2021  Substance and Sexual Activity   Alcohol use: Yes    Alcohol/week: 2.0 standard drinks of alcohol    Types: 2 Cans of beer per week    Comment: socially   Drug use: Yes    Types: Marijuana    Comment: hemp products,occasionally   Sexual activity: Yes  Other Topics Concern   Not on file  Social History Narrative   Not on file   Social Determinants of Health   Financial Resource Strain: Not on file  Food Insecurity: Not on file  Transportation Needs: Not on file  Physical Activity: Not on file  Stress: Not on file  Social Connections: Not on file  Intimate Partner Violence: Not on file    Family History  Problem Relation Age of Onset   Cancer Mother        Thyms   Heart disease Mother    Hypertension Mother    COPD Mother    Cancer Father        skin, prostrate   Diabetes Father        Type 1   Heart disease Father    Hypertension Father    Cancer Sister        Skin   Coronary artery disease Other    Colon cancer Neg Hx    Colon polyps Neg Hx    Esophageal cancer Neg Hx    Rectal cancer Neg Hx    Stomach cancer Neg Hx     ROS: no fevers or chills, productive cough, hemoptysis, dysphasia,  odynophagia, melena, hematochezia, dysuria, hematuria, rash, seizure activity, orthopnea, PND, pedal edema, claudication. Remaining systems are negative.  Physical Exam: Well-developed well-nourished in no acute distress.  Skin is warm and dry.  HEENT is normal.  Neck is supple.  Chest is clear to auscultation with normal expansion.  Cardiovascular exam is regular rate and rhythm.  Abdominal exam nontender or distended. No masses palpated. Extremities show no edema. neuro grossly intact  ECG- personally reviewed  A/P  1 coronary artery disease status post coronary artery bypass graft-patient doing well with no chest pain following surgery.  Continue medical therapy with aspirin and statin.  2 recent syncope-question vagal episode prior to recent admission.  No recurrent events.  He should not drive for 6 months following most recent episode.  3 hypertension-blood pressure controlled.  Continue present medical regimen.  4 hyperlipidemia-continue statin.  5 medullary thyroid cancer-patient will require thyroidectomy in the near future.  6 abnormal chest CT-recent chest CT showed lung nodule.  Patient will need follow-up CT scan in August.  Kirk Ruths, MD

## 2022-04-24 ENCOUNTER — Encounter (INDEPENDENT_AMBULATORY_CARE_PROVIDER_SITE_OTHER): Payer: BC Managed Care – PPO

## 2022-04-24 DIAGNOSIS — Z4802 Encounter for removal of sutures: Secondary | ICD-10-CM

## 2022-04-29 DIAGNOSIS — C73 Malignant neoplasm of thyroid gland: Secondary | ICD-10-CM | POA: Diagnosis not present

## 2022-04-29 DIAGNOSIS — Z9189 Other specified personal risk factors, not elsewhere classified: Secondary | ICD-10-CM | POA: Diagnosis not present

## 2022-04-29 DIAGNOSIS — Z809 Family history of malignant neoplasm, unspecified: Secondary | ICD-10-CM | POA: Diagnosis not present

## 2022-04-29 DIAGNOSIS — Z87442 Personal history of urinary calculi: Secondary | ICD-10-CM | POA: Diagnosis not present

## 2022-04-30 ENCOUNTER — Other Ambulatory Visit: Payer: Self-pay | Admitting: Internal Medicine

## 2022-04-30 DIAGNOSIS — C73 Malignant neoplasm of thyroid gland: Secondary | ICD-10-CM

## 2022-05-03 ENCOUNTER — Encounter (INDEPENDENT_AMBULATORY_CARE_PROVIDER_SITE_OTHER): Payer: BC Managed Care – PPO

## 2022-05-03 DIAGNOSIS — R232 Flushing: Secondary | ICD-10-CM | POA: Diagnosis not present

## 2022-05-03 DIAGNOSIS — Z9189 Other specified personal risk factors, not elsewhere classified: Secondary | ICD-10-CM | POA: Diagnosis not present

## 2022-05-03 DIAGNOSIS — C73 Malignant neoplasm of thyroid gland: Secondary | ICD-10-CM | POA: Diagnosis not present

## 2022-05-03 DIAGNOSIS — Z4802 Encounter for removal of sutures: Secondary | ICD-10-CM

## 2022-05-07 ENCOUNTER — Encounter: Payer: Self-pay | Admitting: Cardiology

## 2022-05-07 ENCOUNTER — Ambulatory Visit (INDEPENDENT_AMBULATORY_CARE_PROVIDER_SITE_OTHER): Payer: BC Managed Care – PPO | Admitting: Cardiology

## 2022-05-07 VITALS — BP 106/66 | HR 61 | Ht 68.0 in | Wt 160.8 lb

## 2022-05-07 DIAGNOSIS — C73 Malignant neoplasm of thyroid gland: Secondary | ICD-10-CM

## 2022-05-07 DIAGNOSIS — I10 Essential (primary) hypertension: Secondary | ICD-10-CM

## 2022-05-07 DIAGNOSIS — E7800 Pure hypercholesterolemia, unspecified: Secondary | ICD-10-CM | POA: Diagnosis not present

## 2022-05-07 DIAGNOSIS — I2510 Atherosclerotic heart disease of native coronary artery without angina pectoris: Secondary | ICD-10-CM | POA: Diagnosis not present

## 2022-05-10 ENCOUNTER — Other Ambulatory Visit: Payer: Self-pay | Admitting: *Deleted

## 2022-05-10 DIAGNOSIS — J984 Other disorders of lung: Secondary | ICD-10-CM

## 2022-05-13 ENCOUNTER — Encounter: Payer: Self-pay | Admitting: Cardiothoracic Surgery

## 2022-05-13 ENCOUNTER — Other Ambulatory Visit: Payer: BC Managed Care – PPO

## 2022-05-13 ENCOUNTER — Ambulatory Visit
Admission: RE | Admit: 2022-05-13 | Discharge: 2022-05-13 | Disposition: A | Discharge status: Home / Self Care | Payer: BC Managed Care – PPO | Source: Ambulatory Visit | Attending: Internal Medicine | Admitting: Internal Medicine

## 2022-05-13 ENCOUNTER — Ambulatory Visit
Admission: RE | Admit: 2022-05-13 | Discharge: 2022-05-13 | Disposition: A | Discharge status: Home / Self Care | Payer: BC Managed Care – PPO | Source: Ambulatory Visit | Attending: Cardiothoracic Surgery | Admitting: Cardiothoracic Surgery

## 2022-05-13 ENCOUNTER — Ambulatory Visit (INDEPENDENT_AMBULATORY_CARE_PROVIDER_SITE_OTHER): Payer: Self-pay | Admitting: Cardiothoracic Surgery

## 2022-05-13 DIAGNOSIS — J929 Pleural plaque without asbestos: Secondary | ICD-10-CM | POA: Diagnosis not present

## 2022-05-13 DIAGNOSIS — Z01818 Encounter for other preprocedural examination: Secondary | ICD-10-CM | POA: Diagnosis not present

## 2022-05-13 DIAGNOSIS — J3489 Other specified disorders of nose and nasal sinuses: Secondary | ICD-10-CM | POA: Diagnosis not present

## 2022-05-13 DIAGNOSIS — R918 Other nonspecific abnormal finding of lung field: Secondary | ICD-10-CM | POA: Diagnosis not present

## 2022-05-13 DIAGNOSIS — J9811 Atelectasis: Secondary | ICD-10-CM | POA: Diagnosis not present

## 2022-05-13 DIAGNOSIS — C73 Malignant neoplasm of thyroid gland: Secondary | ICD-10-CM | POA: Diagnosis not present

## 2022-05-13 DIAGNOSIS — J984 Other disorders of lung: Secondary | ICD-10-CM

## 2022-05-13 DIAGNOSIS — I3139 Other pericardial effusion (noninflammatory): Secondary | ICD-10-CM | POA: Diagnosis not present

## 2022-05-13 DIAGNOSIS — I6522 Occlusion and stenosis of left carotid artery: Secondary | ICD-10-CM | POA: Diagnosis not present

## 2022-05-13 DIAGNOSIS — Z09 Encounter for follow-up examination after completed treatment for conditions other than malignant neoplasm: Secondary | ICD-10-CM

## 2022-05-13 MED ORDER — IOPAMIDOL (ISOVUE-300) INJECTION 61%
75.0000 mL | Freq: Once | INTRAVENOUS | Status: AC | PRN
  Administered 2022-05-13: 75 mL via INTRAVENOUS

## 2022-05-13 NOTE — Progress Notes (Signed)
HPI:  Patient returns for routine postoperative follow-up having undergone CABG x4 on June 4. The patient's early postoperative recovery has been unremarkable. Since hospital discharge the patient reports Generalized aches and pains in his right chest and left arm.  All surgical incisions are healing well.  He is walking up to 3 miles daily without angina.  His weight is stable and appetite improved.  The patient has a known right medullary thyroid tumor being evaluated by Dr. Buddy Duty for surgery.  A nodular density in his right upper lung field preoperatively was followed up with CT chest today which shows almost complete resolution of the probable inflammatory focus-he has been a smoker in the past.  A neck CT scan shows persistence of the mass in his right thyroid.  Current Outpatient Medications  Medication Sig Dispense Refill   aspirin 81 MG tablet Take 81 mg by mouth daily.      atorvastatin (LIPITOR) 80 MG tablet Take 1 tablet (80 mg total) by mouth daily.     clopidogrel (PLAVIX) 75 MG tablet Take 1 tablet (75 mg total) by mouth daily. 30 tablet 3   ezetimibe (ZETIA) 10 MG tablet Take 1 tablet (10 mg total) by mouth daily. 90 tablet 3   ferrous sulfate 325 (65 FE) MG EC tablet Take 1 tablet (325 mg total) by mouth daily with breakfast. For one month then stop. If develops constipation, may stop sooner  0   isosorbide mononitrate (IMDUR) 30 MG 24 hr tablet Take 0.5 tablets (15 mg total) by mouth daily. 15 tablet 0   metoprolol tartrate (LOPRESSOR) 25 MG tablet Take 1/2 tablets (12.5 mg total) by mouth 2 (two) times daily. 60 tablet 3   potassium chloride SA (KLOR-CON M) 20 MEQ tablet Take 1 tablet (20 mEq total) by mouth daily. 7 tablet 0   Simethicone (GAS-X PO) Take 1 tablet by mouth daily as needed (gas).     tamsulosin (FLOMAX) 0.4 MG CAPS capsule Take 1 capsule (0.4 mg total) by mouth daily. (Patient taking differently: Take 0.4 mg by mouth daily as needed (when needed to pass urine).)  14 capsule 0   No current facility-administered medications for this visit.    Physical Exam  Blood pressure 107/71, pulse 64, resp. rate 20, height '5\' 8"'$  (1.727 m), weight 158 lb 3.2 oz (71.8 kg), SpO2 98 %.        Exam    General- alert and comfortable.  Surgical incisions in the chest right leg and left arm all healing well.    Neck- no JVD, no cervical adenopathy palpable, no carotid bruit   Lungs- clear without rales, wheezes   Cor- regular rate and rhythm, no murmur , gallop   Abdomen- soft, non-tender   Extremities - warm, non-tender, minimal edema   Neuro- oriented, appropriate, no focal weakness  Diagnostic Tests: CT scan of chest images show almost complete resolution of the right upper lobe nodular infiltrate consistent with inflammation.  Sternal healing and wire orientation are normal at this point 4 weeks postoperative.  There is no pleural effusion.  Impression: Excellent early recovery 4 weeks following multivessel coronary bypass grafting for unstable angina. Plan: Patient may drive and lift up to 10-15 pounds.  He will stop his isosorbide and continue his other meds including Plavix which will be stopped 1 week before his thyroid resection. He should be recovered well enough by mid August to undergo his thyroid surgery.  He is encouraged to follow a heart healthy diet and  heart healthy lifestyle and return for review of progress in 8 weeks.  [name], MD [facility] [phone]

## 2022-05-20 NOTE — Telephone Encounter (Signed)
Will send a message to pre op provider to please review if the pt has been cleared by Dr. Stanford Breed.

## 2022-05-20 NOTE — Telephone Encounter (Signed)
   Austin Vincent with Dr. Janeice Robinson office calling to f/u clearance. She said to fax# [phone]

## 2022-05-20 NOTE — Telephone Encounter (Signed)
Per Dr. Jacalyn Lefevre note at office visit 05/07/22, he recommends that surgery be delayed for approximately 12 weeks after CABG which would be August 22. At that time, we would discontinue Plavix 5 to 7 days prior to surgery and resume afterwards when hemostasis achieved.  Please contact our office with questions.  Emmaline Life, NP-C    05/20/2022, 1:59 PM [place] [address] Office [phone] Fax [phone]

## 2022-05-24 DIAGNOSIS — Z87442 Personal history of urinary calculi: Secondary | ICD-10-CM | POA: Diagnosis not present

## 2022-05-24 DIAGNOSIS — C73 Malignant neoplasm of thyroid gland: Secondary | ICD-10-CM | POA: Diagnosis not present

## 2022-05-24 DIAGNOSIS — R232 Flushing: Secondary | ICD-10-CM | POA: Diagnosis not present

## 2022-05-27 DIAGNOSIS — Z87442 Personal history of urinary calculi: Secondary | ICD-10-CM | POA: Diagnosis not present

## 2022-05-27 DIAGNOSIS — Z809 Family history of malignant neoplasm, unspecified: Secondary | ICD-10-CM | POA: Diagnosis not present

## 2022-05-27 DIAGNOSIS — C73 Malignant neoplasm of thyroid gland: Secondary | ICD-10-CM | POA: Diagnosis not present

## 2022-05-27 DIAGNOSIS — Z8042 Family history of malignant neoplasm of prostate: Secondary | ICD-10-CM | POA: Diagnosis not present

## 2022-06-11 ENCOUNTER — Other Ambulatory Visit: Payer: Self-pay | Admitting: Otolaryngology

## 2022-06-13 ENCOUNTER — Encounter: Payer: Self-pay | Admitting: Family Medicine

## 2022-06-13 ENCOUNTER — Ambulatory Visit (INDEPENDENT_AMBULATORY_CARE_PROVIDER_SITE_OTHER): Payer: BC Managed Care – PPO | Admitting: Family Medicine

## 2022-06-13 VITALS — BP 124/82 | HR 77 | Temp 97.7°F | Ht 68.0 in | Wt 159.0 lb

## 2022-06-13 DIAGNOSIS — C73 Malignant neoplasm of thyroid gland: Secondary | ICD-10-CM | POA: Diagnosis not present

## 2022-06-13 DIAGNOSIS — I10 Essential (primary) hypertension: Secondary | ICD-10-CM

## 2022-06-13 DIAGNOSIS — M25512 Pain in left shoulder: Secondary | ICD-10-CM | POA: Diagnosis not present

## 2022-06-13 DIAGNOSIS — I2510 Atherosclerotic heart disease of native coronary artery without angina pectoris: Secondary | ICD-10-CM | POA: Diagnosis not present

## 2022-06-13 DIAGNOSIS — D649 Anemia, unspecified: Secondary | ICD-10-CM

## 2022-06-13 DIAGNOSIS — Z951 Presence of aortocoronary bypass graft: Secondary | ICD-10-CM

## 2022-06-13 LAB — CBC
HCT: 40.9 % (ref 39.0–52.0)
Hemoglobin: 13.6 g/dL (ref 13.0–17.0)
MCHC: 33.4 g/dL (ref 30.0–36.0)
MCV: 85.5 fl (ref 78.0–100.0)
Platelets: 230 10*3/uL (ref 150.0–400.0)
RBC: 4.78 Mil/uL (ref 4.22–5.81)
RDW: 14.5 % (ref 11.5–15.5)
WBC: 5 10*3/uL (ref 4.0–10.5)
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
## 2022-06-25 ENCOUNTER — Encounter: Payer: Self-pay | Admitting: Physical Therapy

## 2022-06-25 ENCOUNTER — Ambulatory Visit: Payer: BC Managed Care – PPO | Attending: Family Medicine | Admitting: Physical Therapy

## 2022-06-25 DIAGNOSIS — R278 Other lack of coordination: Secondary | ICD-10-CM | POA: Insufficient documentation

## 2022-06-25 DIAGNOSIS — R293 Abnormal posture: Secondary | ICD-10-CM | POA: Diagnosis not present

## 2022-06-25 DIAGNOSIS — M25512 Pain in left shoulder: Secondary | ICD-10-CM | POA: Diagnosis not present

## 2022-06-25 DIAGNOSIS — M6281 Muscle weakness (generalized): Secondary | ICD-10-CM | POA: Insufficient documentation
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
## 2022-06-27 NOTE — H&P (Signed)
HPI:   Austin Vincent is a 51 y.o. male who presents as a consult Patient.   Referring Provider: Orlie Dakin, MD  Chief complaint: Thyroid mass.  HPI: He was undergoing a chest CT for surveillance as a former smoker and an incidental finding of a thyroid nodule was identified. He then had an ultrasound. Recommendation was made for tissue sampling. He does not have any symptoms related to his thyroid.  PMH/Meds/All/SocHx/FamHx/ROS:   History reviewed. No pertinent past medical history.  History reviewed. No pertinent surgical history.  No family history of bleeding disorders, wound healing problems or difficulty with anesthesia.   Social History   Socioeconomic History   Marital status: Divorced  Spouse name: Not on file   Number of children: Not on file   Years of education: Not on file   Highest education level: Not on file  Occupational History   Not on file  Tobacco Use   Smoking status: Former  Types: Cigarettes   Smokeless tobacco: Former  Substance and Sexual Activity   Alcohol use: Not Currently   Drug use: Not on file   Sexual activity: Not on file  Other Topics Concern   Not on file  Social History Narrative   Not on file   Social Determinants of Health   Financial Resource Strain: Not on file  Food Insecurity: Not on file  Transportation Needs: Not on file  Physical Activity: Not on file  Stress: Not on file  Social Connections: Not on file  Housing Stability: Not on file   Current Outpatient Medications:   atorvastatin (LIPITOR) 80 MG tablet, Take 40 mg by mouth., Disp: , Rfl:   ezetimibe (ZETIA) 10 mg tablet, Take 1 tablet (10 mg total) by mouth daily., Disp: , Rfl:   metoPROLOL succinate (TOPROL-XL) 25 MG 24 hr tablet, Take 1 tablet (25 mg total) by mouth daily., Disp: , Rfl:   A complete ROS was performed with pertinent positives/negatives noted in the HPI. The remainder of the ROS are negative.   Physical Exam:   There were no  vitals taken for this visit.  General: Healthy and alert, in no distress, breathing easily. Normal affect. In a pleasant mood. Head: Normocephalic, atraumatic. No masses, or scars. Eyes: Pupils are equal, and reactive to light. Vision is grossly intact. No spontaneous or gaze nystagmus. Ears: Ear canals are clear. Tympanic membranes are intact, with normal landmarks and the middle ears are clear and healthy. Hearing: Grossly normal. Nose: Nasal cavities are clear with healthy mucosa, no polyps or exudate. Airways are patent. Face: No masses or scars, facial nerve function is symmetric. Oral Cavity: No mucosal abnormalities are noted. Tongue with normal mobility. Dentition appears healthy. Oropharynx: Tonsils are symmetric. There are no mucosal masses identified. Tongue base appears normal and healthy. Larynx/Hypopharynx: indirect exam reveals healthy, mobile vocal cords, without mucosal lesions in the hypopharynx or larynx. Chest: Deferred Neck: No palpable masses, no cervical adenopathy, the right lobe of the thyroid is slightly enlarged and slightly tender. There may be a distinct 2 cm mass palpable but it is a little hard to say for sure.. Neuro: Cranial nerves II-XII with normal function. Balance: Normal gate. Other findings: none.  Independent Review of Additional Tests or Records:  Thyroid ultrasound:  IMPRESSION:  Nodule 1 (TI-RADS 5), measuring 2.9 cm, located in the mid right  thyroid lobe, meets criteria for FNA.   Procedures:  none  Impression & Plans:  2.9 cm mass right thyroid. Recommend ultrasound-guided biopsy.  We will discuss results when available.

## 2022-07-02 ENCOUNTER — Encounter (HOSPITAL_COMMUNITY): Payer: Self-pay | Admitting: Otolaryngology

## 2022-07-02 ENCOUNTER — Other Ambulatory Visit: Payer: Self-pay

## 2022-07-02 NOTE — Progress Notes (Signed)
PCP - Dr. Arlester Marker Cardiologist - Dr. Kirk Ruths  PPM/ICD - denies   Chest x-ray - 04/16/22 EKG - 04/13/22 Stress Test - 11/08/11 ECHO - 03/18/22 Cardiac Cath - 04/09/22  CPAP - denies  DM- denies  Blood Thinner Instructions: Plavix- pt has not taken since 8/14 Aspirin Instructions: ASA- hold DOS  ERAS Protcol - no, NPO  COVID TEST- n/a  Anesthesia review: yes, cardiac hx  Patient verbally denies any shortness of breath, fever, cough and chest pain during phone call   -------------  SDW INSTRUCTIONS given:  Your procedure is scheduled on 07/03/22.  Report to [facility] Main Entrance "A" at 0730 A.M., and check in at the Admitting office.  Call this number if you have problems the morning of surgery:  [phone]   Remember:  Do not eat or drink after midnight the night before your surgery      Take these medicines the morning of surgery with A SIP OF WATER  Metoprolol Zetia Atorvastatin   As of today, STOP taking any Aspirin (unless otherwise instructed by your surgeon) Aleve, Naproxen, Ibuprofen, Motrin, Advil, Goody's, BC's, all herbal medications, fish oil, and all vitamins.                      Do not wear jewelry            Do not wear lotions, powders, colognes, or deodorant.             Men may shave face and neck.            Do not bring valuables to the hospital. [facility] is not responsible for any belongings or valuables.  Do NOT Smoke (Tobacco/Vaping) 24 hours prior to your procedure If you use a CPAP at night, you may bring all equipment for your overnight stay.   Contacts, glasses, dentures or bridgework may not be worn into surgery.      For patients admitted to the hospital, discharge time will be determined by your treatment team.   Patients discharged the day of surgery will not be allowed to drive home, and someone needs to stay with them for 24 hours.    Special instructions:   [place]- Preparing For Surgery  Before  surgery, you can play an important role. Because skin is not sterile, your skin needs to be as free of germs as possible. You can reduce the number of germs on your skin by washing with CHG (chlorahexidine gluconate) Soap before surgery.  CHG is an antiseptic cleaner which kills germs and bonds with the skin to continue killing germs even after washing.    Oral Hygiene is also important to reduce your risk of infection.  Remember - BRUSH YOUR TEETH THE MORNING OF SURGERY WITH YOUR REGULAR TOOTHPASTE  Please do not use if you have an allergy to CHG or antibacterial soaps. If your skin becomes reddened/irritated stop using the CHG.  Do not shave (including legs and underarms) for at least 48 hours prior to first CHG shower. It is OK to shave your face.  Please follow these instructions carefully.   Shower the NIGHT BEFORE SURGERY and the MORNING OF SURGERY with DIAL Soap.   Pat yourself dry with a CLEAN TOWEL.  Wear CLEAN PAJAMAS to bed the night before surgery  Place CLEAN SHEETS on your bed the night of your first shower and DO NOT SLEEP WITH PETS.   Day of Surgery: Please shower morning of  surgery  Wear Clean/Comfortable clothing the morning of surgery Do not apply any deodorants/lotions.   Remember to brush your teeth WITH YOUR REGULAR TOOTHPASTE.   Questions were answered. Patient verbalized understanding of instructions.

## 2022-07-02 NOTE — Anesthesia Preprocedure Evaluation (Signed)
Anesthesia Evaluation  Patient identified by MRN, date of birth, ID band Patient awake    Reviewed: Allergy & Precautions, NPO status , Patient's Chart, lab work & pertinent test results  Airway Mallampati: II  TM Distance: >3 FB Neck ROM: Full    Dental  (+) Dental Advisory Given   Pulmonary COPD, former smoker,    breath sounds clear to auscultation       Cardiovascular hypertension, Pt. on medications and Pt. on home beta blockers + CAD, + Past MI and + CABG   Rhythm:Regular Rate:Normal     Neuro/Psych negative neurological ROS     GI/Hepatic negative GI ROS, Neg liver ROS,   Endo/Other  negative endocrine ROS  Renal/GU Renal disease     Musculoskeletal   Abdominal   Peds  Hematology negative hematology ROS (+)   Anesthesia Other Findings   Reproductive/Obstetrics                            Anesthesia Physical Anesthesia Plan  ASA: 3  Anesthesia Plan: General   Post-op Pain Management: Tylenol PO (pre-op)*   Induction: Intravenous  PONV Risk Score and Plan: 2 and Dexamethasone and Treatment may vary due to age or medical condition  Airway Management Planned: Oral ETT  Additional Equipment: None  Intra-op Plan:   Post-operative Plan: Extubation in OR  Informed Consent: I have reviewed the patients History and Physical, chart, labs and discussed the procedure including the risks, benefits and alternatives for the proposed anesthesia with the patient or authorized representative who has indicated his/her understanding and acceptance.     Dental advisory given  Plan Discussed with: CRNA  Anesthesia Plan Comments: ( )       Anesthesia Quick Evaluation

## 2022-07-02 NOTE — Progress Notes (Addendum)
Anesthesia Chart Review:  Case: [id number] Date/Time: 07/03/22 0945   Procedure: THYROIDECTOMY   Anesthesia type: General   Pre-op diagnosis: Medullary thyroid carcinoma   Location: [facility] OR ROOM 03 / [facility]   Surgeons: Izora Gala, MD       DISCUSSION: Patient is a 51 year old male scheduled for the above procedure.  History includes former smoker (quit 11/12/19; quit smokeless tobacco 04/11/22), CAD (inferior MI 1999, s/p PCI RCA; NSTEMI, s/p thrombectomy/DES mid-distal RCA, PTCA mid RPL, CTO LAD with collaterals 12/01/13; syncope/NSTEMI 04/06/22, s/p CABG 04/12/22: LIMA-LAD, SVG-PDA, left RA-DIAG, SVG-Ramus Int), mitral regurgitation (trivial MR 04/07/22 echo), HTN, HLD, nephrolithiasis, medullary thyroid cancer (02/2022).    Incidental right thyroid nodule on 01/23/22 LDCT for lung cancer screening. 03/07/22 biopsy showed medullary thyroid cancer. Thyroidectomy had been planned, but he began having throat tightness with activity similar to previous MI and had out-patient LHC planned, but instead presented to ED on 04/06/22 with syncope and NSTEMI, s/p 4V CABG on 04/12/22.   Last CT surgery visit with Dr. Prescott Gum was on 05/13/22. He wrote, "Plan: Patient may drive and lift up to 10-15 pounds.  He will stop his isosorbide and continue his other meds including Plavix which will be stopped 1 week before his thyroid resection. He should be recovered well enough by mid August to undergo his thyroid surgery.  He is encouraged to follow a heart healthy diet and heart healthy lifestyle and return for review of progress in 8 weeks." Per Dr. Stanford Breed, he would recommend resuming Plavix afterwards when hemostasis achieved and would plan to continue Plavix for one year post event.   He is a same day work-up. Last EKG showed lateral injury pattern, but was post CABG. Would favor repeat EKG for new baseline. LFTs were also elevated during that admission, so would favor updating CMET. He has an up to date CBC from 06/14/22. As of  06/27/22, Plavix is documented as being on hold.   Anesthesia team to evaluate on the day of surgery.    VS:  BP Readings from Last 3 Encounters:  06/13/22 124/82  05/13/22 107/71  05/07/22 106/66   Pulse Readings from Last 3 Encounters:  06/13/22 77  05/13/22 64  05/07/22 61     PROVIDERS: Haydee Salter, MD is PCP  Kirk Ruths, MD is cardiologist Prescott Gum, [name], MD is CT surgeon Delrae Rend, MD is Endocrinologist Izora Gala, MD is ENT   LABS: Labs as indicated on the day of surgery. He had CBC on 06/13/22, but last CMET was during MI/CABG admission with ALT 112, AST 100. See DISCUSSION. (all labs ordered are listed, but only abnormal results are displayed)  Labs Reviewed - No data to display Lab Results  Component Value Date   WBC 5.0 06/13/2022   HGB 13.6 06/13/2022   HCT 40.9 06/13/2022   PLT 230.0 06/13/2022   GLUCOSE 82 04/16/2022   CHOL 126 03/27/2022   TRIG 59 03/27/2022   HDL 45 03/27/2022   LDLCALC 68 03/27/2022   ALT 112 (H) 04/10/2022   AST 100 (H) 04/10/2022   NA 135 04/16/2022   K 4.1 04/16/2022   CL 103 04/16/2022   CREATININE 1.00 04/16/2022   BUN 15 04/16/2022   CO2 27 04/16/2022   TSH 0.981 04/10/2022   HGBA1C 5.5 04/10/2022     PFTs 04/09/22: FVC 3.75 (80%), post 3.73 (80%). FEV1 3.11 (86%), post 3.22 (89%). DLCO unc 22.91 (84%), cor 23.53 (86%).   IMAGES: CT soft tissue  neck 05/13/22: FINDINGS: - Pharynx and larynx: Unremarkable.  No mass or swelling. - Salivary glands: Parotid and submandibular glands are unremarkable. - Thyroid: Mildly enlarged right lobe with heterogeneity probably reflecting known carcinoma. - Lymph nodes: No enlarged nodes. - Vascular: Major neck vessels are patent. Mixed plaque at the left ICA origin. - Limited intracranial: No abnormal enhancement. - Visualized orbits: Unremarkable. - Mastoids and visualized paranasal sinuses: Patchy mucosal thickening. - Skeleton: No acute or significant osseous  abnormality. - Upper chest: Dictated separately. IMPRESSION: No adenopathy.  CT Chest 05/13/22: IMPRESSION: 1. Interval improvement in segmental nodularity in the RIGHT upper lobe most consistent with resolving infectious or inflammatory process. 2. Expected postsurgical change associated recent CABG. No complicating features. 3. Nodule of the RIGHT lobe of the thyroid gland again noted and previously biopsied.    EKG: Last EKG done was on 04/13/22 post CABG on 04/12/22 and showed SB at 59 bpm, inferior infarct, lateral injury pattern. Updated EKG ordered for day of surgery for new baseline.    CV: INTRA-OP TEE (for CABG) 04/12/22: POST-OP IMPRESSIONS  _ Left Ventricle: has normal systolic function, with an ejection fraction  of  55-60%.  _ Right Ventricle: The right ventricle appears unchanged from pre-bypass.  _ Aorta: The aorta appears unchanged from pre-bypass.  _ Left Atrial Appendage: The left atrial appendage appears unchanged from  pre-bypass.  _ Aortic Valve: The aortic valve appears unchanged from pre-bypass.  _ Mitral Valve: The mitral valve appears unchanged from pre-bypass.  _ Tricuspid Valve: The tricuspid valve appears unchanged from pre-bypass.  _ Pulmonic Valve: The pulmonic valve appears unchanged from pre-bypass.  _ Interatrial Septum: The interatrial septum appears unchanged from  pre-bypass.    US Carotid 04/10/22: Summary:  - Right Carotid: The extracranial vessels were near-normal with only minimal wall thickening or plaque.  - Left Carotid: The extracranial vessels were near-normal with only minimal wall thickening or plaque.  - Vertebrals:  Bilateral vertebral arteries demonstrate antegrade flow.  - Subclavians: Normal flow hemodynamics were seen in bilateral subclavian arteries.    LHC 03/30/22 (PRE-CABG):   Mid LAD lesion is 100% stenosed.   1st Diag lesion is 90% stenosed.   1st Mrg lesion is 50% stenosed.   Mid Cx lesion is 90% stenosed.   2nd Mrg  lesion is 90% stenosed.   Ost RCA to Mid RCA lesion is 95% stenosed.   Mid RCA to Dist RCA lesion is 30% stenosed.   There is mild left ventricular systolic dysfunction.   LV end diastolic pressure is normal.   The left ventricular ejection fraction is 50-55% by visual estimate.   Severe 3 vessel obstructive CAD Mildly reduced LV function. Inferoapical HK Normal LVEDP   Plan: Needs CT surgery consult for CABG.  - S/p CABG 04/12/22: LIMA-LAD, SVG-PDA, left RA-DIAG, SVG-Ramus Int    Echo 04/07/22: IMPRESSIONS   1. Can consider f/u contrast imaging to look at apex if clinically  indicated.   2. Distal septal / apical hypokinesis Apex with calcified band/chords  doubt thrombus. Left ventricular ejection fraction, by estimation, is 50  to 55%. The left ventricle has low normal function. The left ventricle  demonstrates regional wall motion  abnormalities (see scoring diagram/findings for description). Left  ventricular diastolic parameters were normal.   3. Right ventricular systolic function is normal. The right ventricular  size is normal.   4. Left atrial size was mildly dilated.   5. The mitral valve is abnormal. Trivial mitral valve regurgitation. No  evidence of mitral stenosis.   6. The aortic valve is tricuspid. There is mild calcification of the  aortic valve. Aortic valve regurgitation is not visualized. No aortic  stenosis is present.   7. The inferior vena cava is normal in size with greater than 50%  respiratory variability, suggesting right atrial pressure of 3 mmHg.    Past Medical History:  Diagnosis Date   CAD (coronary artery disease)    a. s/p MI @ age 66 with PCI of the RCA;  b. CTO of the LAD;  c. 11/2013 NSTEMI/PCI: LM nl, LAD 100 L->L collats, LCX 50p, OM2 80p, OM1 60ost, RCA 100p (3.5x38 Promus DES), RPL 99/21mPTCA only, EF 40-50%;  c. 11/2013 Echo: EF 50-55%, Gr1 DD, mild MR.   HYPERLIPIDEMIA    HYPERTENSION    Kidney stone 11/2021   MITRAL REGURGITATION     Myocardial infarction ([facility]    at age [age]  Tobacco abuse     Past Surgical History:  Procedure Laterality Date   COLONOSCOPY  02/12/2022   CORONARY ARTERY BYPASS GRAFT N/A 04/12/2022   Procedure: CORONARY ARTERY BYPASS GRAFTING (CABG) X FOUR BYPASSES USING OPEN LEFT INTERNAL MAMMARY ARTERY, OPEN LEFT RADIAL ARTERY, AND ENDOSOCPIC RIGHT AND LEFT GREATER SAPHENOUS VEIN HARVEST.;  Surgeon: VDahlia Byes MD;  [facility]  Service: Open Heart Surgery;  Laterality: N/A;   CORONARY STENT PLACEMENT     LEFT HEART CATH AND CORONARY ANGIOGRAPHY N/A 04/09/2022   Procedure: LEFT HEART CATH AND CORONARY ANGIOGRAPHY;  Surgeon: JMartinique Peter M, MD;  [facility];  Service: Cardiovascular;  Laterality: N/A;   LEFT HEART CATHETERIZATION WITH CORONARY ANGIOGRAM N/A 12/01/2013   Procedure: LEFT HEART CATHETERIZATION WITH CORONARY ANGIOGRAM;  Surgeon: DLeonie Man MD;  [facility];  Service: Cardiovascular;  Laterality: N/A;   LITHOTRIPSY  2003   RADIAL ARTERY HARVEST Left 04/12/2022   Procedure: RADIAL ARTERY HARVEST;  Surgeon: VDahlia Byes MD;  [place] [facility] [place]  Service: Open Heart Surgery;  Laterality: Left;   TEE WITHOUT CARDIOVERSION N/A 04/12/2022   Procedure: TRANSESOPHAGEAL ECHOCARDIOGRAM (TEE);  Surgeon: VDahlia Byes MD;  [place]  Service: Open Heart Surgery;  Laterality: N/A;    MEDICATIONS: No current facility-administered medications for this encounter.    aspirin 81 MG tablet   atorvastatin (LIPITOR) 80 MG tablet   clopidogrel (PLAVIX) 75 MG tablet   ezetimibe (ZETIA) 10 MG tablet   ibuprofen (ADVIL) 200 MG tablet   metoprolol tartrate (LOPRESSOR) 25 MG tablet   [START ON 07/04/2022] levothyroxine (SYNTHROID) 112 MCG tablet   tamsulosin (FLOMAX) 0.4 MG CAPS capsule    AMyra Gianotti PA-C Surgical Short Stay/Anesthesiology [facility] HoustonPhone ([phone] [facility] PulaskiPhone [phone]/22/2023 1:28 PM

## 2022-07-03 ENCOUNTER — Encounter (HOSPITAL_COMMUNITY): Payer: Self-pay | Admitting: Otolaryngology

## 2022-07-03 ENCOUNTER — Observation Stay (HOSPITAL_COMMUNITY)
Admission: RE | Admit: 2022-07-03 | Discharge: 2022-07-04 | Disposition: A | Discharge status: Home / Self Care | Payer: BC Managed Care – PPO | Attending: Otolaryngology | Admitting: Otolaryngology

## 2022-07-03 ENCOUNTER — Ambulatory Visit (HOSPITAL_COMMUNITY): Payer: BC Managed Care – PPO | Admitting: Vascular Surgery

## 2022-07-03 ENCOUNTER — Other Ambulatory Visit: Payer: Self-pay

## 2022-07-03 ENCOUNTER — Encounter (HOSPITAL_COMMUNITY)
Admission: RE | Disposition: A | Discharge status: Home / Self Care | Payer: Self-pay | Source: Home / Self Care | Attending: Otolaryngology

## 2022-07-03 DIAGNOSIS — I2510 Atherosclerotic heart disease of native coronary artery without angina pectoris: Secondary | ICD-10-CM

## 2022-07-03 DIAGNOSIS — C73 Malignant neoplasm of thyroid gland: Secondary | ICD-10-CM | POA: Diagnosis not present

## 2022-07-03 DIAGNOSIS — R748 Abnormal levels of other serum enzymes: Secondary | ICD-10-CM

## 2022-07-03 DIAGNOSIS — Z01818 Encounter for other preprocedural examination: Secondary | ICD-10-CM

## 2022-07-03 DIAGNOSIS — Z79899 Other long term (current) drug therapy: Secondary | ICD-10-CM | POA: Insufficient documentation

## 2022-07-03 DIAGNOSIS — I10 Essential (primary) hypertension: Secondary | ICD-10-CM | POA: Diagnosis not present

## 2022-07-03 DIAGNOSIS — Z87891 Personal history of nicotine dependence: Secondary | ICD-10-CM | POA: Insufficient documentation

## 2022-07-03 DIAGNOSIS — Z951 Presence of aortocoronary bypass graft: Secondary | ICD-10-CM

## 2022-07-03 DIAGNOSIS — J449 Chronic obstructive pulmonary disease, unspecified: Secondary | ICD-10-CM | POA: Diagnosis not present

## 2022-07-03 HISTORY — PX: THYROIDECTOMY: SHX17

## 2022-07-03 LAB — COMPREHENSIVE METABOLIC PANEL
ALT: 27 U/L (ref 0–44)
AST: 23 U/L (ref 15–41)
Albumin: 3.7 g/dL (ref 3.5–5.0)
Alkaline Phosphatase: 63 U/L (ref 38–126)
Anion gap: 6 (ref 5–15)
BUN: 12 mg/dL (ref 6–20)
CO2: 25 mmol/L (ref 22–32)
Calcium: 9.2 mg/dL (ref 8.9–10.3)
Chloride: 108 mmol/L (ref 98–111)
Creatinine, Ser: 0.82 mg/dL (ref 0.61–1.24)
GFR, Estimated: >60 mL/min (ref >60)
Glucose, Bld: 102 mg/dL — ABNORMAL HIGH (ref 70–99)
Potassium: 4 mmol/L (ref 3.5–5.1)
Sodium: 139 mmol/L (ref 135–145)
Total Bilirubin: 0.6 mg/dL (ref 0.3–1.2)
Total Protein: 6.4 g/dL — ABNORMAL LOW (ref 6.5–8.1)

## 2022-07-03 LAB — CALCIUM
Calcium: 9.2 mg/dL (ref 8.9–10.3)
Calcium: 9.3 mg/dL (ref 8.9–10.3)

## 2022-07-03 SURGERY — THYROIDECTOMY
Anesthesia: General | Site: Neck

## 2022-07-03 MED ORDER — 0.9 % SODIUM CHLORIDE (POUR BTL) OPTIME
TOPICAL | Status: DC | PRN
  Administered 2022-07-03: 1000 mL

## 2022-07-03 MED ORDER — ONDANSETRON HCL 4 MG/2ML IJ SOLN
INTRAMUSCULAR | Status: DC | PRN
  Administered 2022-07-03: 4 mg via INTRAVENOUS

## 2022-07-03 MED ORDER — PROPOFOL 10 MG/ML IV BOLUS
INTRAVENOUS | Status: AC
  Filled 2022-07-03: qty 20

## 2022-07-03 MED ORDER — LIDOCAINE-EPINEPHRINE 1 %-1:100000 IJ SOLN
INTRAMUSCULAR | Status: AC
  Filled 2022-07-03: qty 1

## 2022-07-03 MED ORDER — FENTANYL CITRATE (PF) 250 MCG/5ML IJ SOLN
INTRAMUSCULAR | Status: DC | PRN
  Administered 2022-07-03: 100 ug via INTRAVENOUS
  Administered 2022-07-03 (×3): 50 ug via INTRAVENOUS

## 2022-07-03 MED ORDER — PROPOFOL 10 MG/ML IV BOLUS
INTRAVENOUS | Status: DC | PRN
  Administered 2022-07-03: 150 mg via INTRAVENOUS

## 2022-07-03 MED ORDER — ACETAMINOPHEN 500 MG PO TABS
1000.0000 mg | ORAL_TABLET | Freq: Once | ORAL | Status: AC
  Administered 2022-07-03: 1000 mg via ORAL
  Filled 2022-07-03: qty 2

## 2022-07-03 MED ORDER — EZETIMIBE 10 MG PO TABS: 10.0000 mg | ORAL_TABLET | Freq: Every day | ORAL | Status: DC

## 2022-07-03 MED ORDER — MIDAZOLAM HCL 2 MG/2ML IJ SOLN
INTRAMUSCULAR | Status: DC | PRN
  Administered 2022-07-03: 2 mg via INTRAVENOUS

## 2022-07-03 MED ORDER — EPHEDRINE SULFATE-NACL 50-0.9 MG/10ML-% IV SOSY
PREFILLED_SYRINGE | INTRAVENOUS | Status: DC | PRN
  Administered 2022-07-03: 10 mg via INTRAVENOUS
  Administered 2022-07-03: 5 mg via INTRAVENOUS

## 2022-07-03 MED ORDER — MIDAZOLAM HCL 2 MG/2ML IJ SOLN
INTRAMUSCULAR | Status: AC
  Filled 2022-07-03: qty 2

## 2022-07-03 MED ORDER — DEXTROSE-NACL 5-0.9 % IV SOLN: INTRAVENOUS | Status: DC

## 2022-07-03 MED ORDER — CHLORHEXIDINE GLUCONATE 0.12 % MT SOLN
15.0000 mL | Freq: Once | OROMUCOSAL | Status: AC
  Administered 2022-07-03: 15 mL via OROMUCOSAL
  Filled 2022-07-03: qty 15

## 2022-07-03 MED ORDER — LACTATED RINGERS IV SOLN: INTRAVENOUS | Status: DC

## 2022-07-03 MED ORDER — ORAL CARE MOUTH RINSE: 15.0000 mL | Freq: Once | OROMUCOSAL | Status: AC

## 2022-07-03 MED ORDER — ASPIRIN 81 MG PO TBEC
81.0000 mg | DELAYED_RELEASE_TABLET | Freq: Every evening | ORAL | Status: DC
  Administered 2022-07-03: 81 mg via ORAL
  Filled 2022-07-03: qty 1

## 2022-07-03 MED ORDER — IBUPROFEN 200 MG PO TABS: 200.0000 mg | ORAL_TABLET | Freq: Four times a day (QID) | ORAL | Status: DC | PRN

## 2022-07-03 MED ORDER — ONDANSETRON 4 MG PO TBDP
4.0000 mg | ORAL_TABLET | Freq: Three times a day (TID) | ORAL | 0 refills | Status: DC | PRN
Start: 2022-07-03 — End: 2022-11-27

## 2022-07-03 MED ORDER — LEVOTHYROXINE SODIUM 112 MCG PO TABS
112.0000 ug | ORAL_TABLET | Freq: Every day | ORAL | Status: DC
  Administered 2022-07-04: 112 ug via ORAL
  Filled 2022-07-03: qty 1

## 2022-07-03 MED ORDER — DEXAMETHASONE SODIUM PHOSPHATE 10 MG/ML IJ SOLN
INTRAMUSCULAR | Status: DC | PRN
  Administered 2022-07-03: 10 mg via INTRAVENOUS

## 2022-07-03 MED ORDER — SUGAMMADEX SODIUM 200 MG/2ML IV SOLN
INTRAVENOUS | Status: DC | PRN
  Administered 2022-07-03: 200 mg via INTRAVENOUS

## 2022-07-03 MED ORDER — ONDANSETRON HCL 4 MG PO TABS: 4.0000 mg | ORAL_TABLET | ORAL | Status: DC | PRN

## 2022-07-03 MED ORDER — METOPROLOL TARTRATE 12.5 MG HALF TABLET
12.5000 mg | ORAL_TABLET | Freq: Two times a day (BID) | ORAL | Status: DC
  Administered 2022-07-03: 12.5 mg via ORAL
  Filled 2022-07-03: qty 1

## 2022-07-03 MED ORDER — TAMSULOSIN HCL 0.4 MG PO CAPS: 0.4000 mg | ORAL_CAPSULE | Freq: Every day | ORAL | Status: DC

## 2022-07-03 MED ORDER — FENTANYL CITRATE (PF) 250 MCG/5ML IJ SOLN
INTRAMUSCULAR | Status: AC
  Filled 2022-07-03: qty 5

## 2022-07-03 MED ORDER — ROCURONIUM BROMIDE 10 MG/ML (PF) SYRINGE
PREFILLED_SYRINGE | INTRAVENOUS | Status: DC | PRN
  Administered 2022-07-03: 50 mg via INTRAVENOUS

## 2022-07-03 MED ORDER — FENTANYL CITRATE (PF) 100 MCG/2ML IJ SOLN: 25.0000 ug | INTRAMUSCULAR | Status: DC | PRN

## 2022-07-03 MED ORDER — HYDROCODONE-ACETAMINOPHEN 7.5-325 MG PO TABS: 1.0000 | ORAL_TABLET | Freq: Four times a day (QID) | ORAL | 0 refills | Status: DC | PRN

## 2022-07-03 MED ORDER — EPHEDRINE 5 MG/ML INJ
INTRAVENOUS | Status: AC
  Filled 2022-07-03: qty 5

## 2022-07-03 MED ORDER — AMISULPRIDE (ANTIEMETIC) 5 MG/2ML IV SOLN: 10.0000 mg | Freq: Once | INTRAVENOUS | Status: DC | PRN

## 2022-07-03 MED ORDER — HYDROCODONE-ACETAMINOPHEN 5-325 MG PO TABS
1.0000 | ORAL_TABLET | ORAL | Status: DC | PRN
  Administered 2022-07-03 (×2): 1 via ORAL
  Filled 2022-07-03 (×2): qty 1

## 2022-07-03 MED ORDER — ATORVASTATIN CALCIUM 80 MG PO TABS: 80.0000 mg | ORAL_TABLET | Freq: Every day | ORAL | Status: DC

## 2022-07-03 MED ORDER — ONDANSETRON HCL 4 MG/2ML IJ SOLN: 4.0000 mg | INTRAMUSCULAR | Status: DC | PRN

## 2022-07-03 MED ORDER — ONDANSETRON HCL 4 MG/2ML IJ SOLN
INTRAMUSCULAR | Status: AC
  Filled 2022-07-03: qty 2

## 2022-07-03 MED ORDER — LIDOCAINE 2% (20 MG/ML) 5 ML SYRINGE
INTRAMUSCULAR | Status: DC | PRN
  Administered 2022-07-03: 60 mg via INTRAVENOUS

## 2022-07-03 SURGICAL SUPPLY — 41 items
BAG COUNTER SPONGE SURGICOUNT (BAG) ×1 IMPLANT
BLADE SURG 15 STRL LF DISP TIS (BLADE) IMPLANT
BLADE SURG 15 STRL SS (BLADE)
CANISTER SUCT 3000ML PPV (MISCELLANEOUS) ×1 IMPLANT
CLEANER TIP ELECTROSURG 2X2 (MISCELLANEOUS) ×1 IMPLANT
CNTNR URN SCR LID CUP LEK RST (MISCELLANEOUS) ×1 IMPLANT
CONT SPEC 4OZ STRL OR WHT (MISCELLANEOUS) ×1
CORD BIPOLAR FORCEPS 12FT (ELECTRODE) ×1 IMPLANT
COVER SURGICAL LIGHT HANDLE (MISCELLANEOUS) ×1 IMPLANT
DERMABOND ADVANCED (GAUZE/BANDAGES/DRESSINGS) ×1
DERMABOND ADVANCED .7 DNX12 (GAUZE/BANDAGES/DRESSINGS) ×1 IMPLANT
DRAIN HEMOVAC 7FR (DRAIN) IMPLANT
DRAIN JACKSON RD 7FR 3/32 (WOUND CARE) IMPLANT
DRAIN SNY 10 ROU (WOUND CARE) IMPLANT
DRAPE HALF SHEET 40X57 (DRAPES) IMPLANT
ELECT COATED BLADE 2.86 ST (ELECTRODE) ×1 IMPLANT
ELECT REM PT RETURN 9FT ADLT (ELECTROSURGICAL) ×1
ELECTRODE REM PT RTRN 9FT ADLT (ELECTROSURGICAL) ×1 IMPLANT
EVACUATOR SILICONE 100CC (DRAIN) ×1 IMPLANT
FORCEPS BIPOLAR SPETZLER 8 1.0 (NEUROSURGERY SUPPLIES) ×1 IMPLANT
GAUZE 4X4 16PLY ~~LOC~~+RFID DBL (SPONGE) IMPLANT
GLOVE BIO SURGEON STRL SZ 6.5 (GLOVE) IMPLANT
GLOVE ECLIPSE 7.5 STRL STRAW (GLOVE) ×1 IMPLANT
GOWN STRL REUS W/ TWL LRG LVL3 (GOWN DISPOSABLE) ×2 IMPLANT
GOWN STRL REUS W/TWL LRG LVL3 (GOWN DISPOSABLE) ×3
KIT BASIN OR (CUSTOM PROCEDURE TRAY) ×1 IMPLANT
KIT TURNOVER KIT B (KITS) ×1 IMPLANT
NDL PRECISIONGLIDE 27X1.5 (NEEDLE) ×1 IMPLANT
NEEDLE PRECISIONGLIDE 27X1.5 (NEEDLE) ×1 IMPLANT
NS IRRIG 1000ML POUR BTL (IV SOLUTION) ×1 IMPLANT
PAD ARMBOARD 7.5X6 YLW CONV (MISCELLANEOUS) ×2 IMPLANT
PENCIL FOOT CONTROL (ELECTRODE) ×1 IMPLANT
SHEARS HARMONIC 9CM CVD (BLADE) ×1 IMPLANT
STAPLER VISISTAT 35W (STAPLE) ×1 IMPLANT
SUT CHROMIC 3 0 PS 2 (SUTURE) ×1 IMPLANT
SUT CHROMIC 4 0 PS 2 18 (SUTURE) IMPLANT
SUT ETHILON 3 0 PS 1 (SUTURE) ×1 IMPLANT
SUT SILK 3 0 REEL (SUTURE) ×1 IMPLANT
SUT SILK 4 0 REEL (SUTURE) ×1 IMPLANT
TOWEL GREEN STERILE FF (TOWEL DISPOSABLE) ×1 IMPLANT
TRAY ENT MC OR (CUSTOM PROCEDURE TRAY) ×1 IMPLANT

## 2022-07-03 NOTE — Progress Notes (Signed)
ENT Post Operative Note  Subjective: Patient seen and examined at bedside. Reports pain controlled. Tolerating regular diet without difficulty.   Vitals:   07/03/22 1415 07/03/22 1452  BP: (!) 146/82 (!) 148/90  Pulse: 78 74  Resp: (!) 25 16  Temp:  97.8 F (36.6 C)  SpO2: 97% 97%     OBJECTIVE  Gen: alert, cooperative, appropriate Head/ENT: EOMI, mucus membranes moist and pink, conjunctiva clear Midline neck incision C/D/I with dermabond intact. Neck soft, with no evidence of seroma or hematoma. JP drain exiting midline neck with sanguinous drainage. Respiratory: Voice without dysphonia. Non-labored breathing, no accessory muscle use, good O2 saturations on room air Neuro: CN II-XII grossly intact  Lab Results  Component Value Date   CALCIUM 9.2 07/03/2022    ASSESS/ PLAN  Austin Vincent is a 51 y.o. male who is POD 0 from total thyroidectomy.  -Continue observation -Continue JP drain to bulb suction. Empty, record output, and recharge every 6 hours. -Encourage IS use, ambulation to tolerance with assist -Pain control -Calcium normal  Thank you for allowing me to participate in the care of this patient. Please do not hesitate to contact me with any questions or concerns.   Jason Coop, [place] [facility] Cell: [phone]

## 2022-07-03 NOTE — Interval H&P Note (Signed)
History and Physical Interval Note:  07/03/2022 8:50 AM  Austin Vincent  has presented today for surgery, with the diagnosis of Medullary thyroid carcinoma.  The various methods of treatment have been discussed with the patient and family. After consideration of risks, benefits and other options for treatment, the patient has consented to  Procedure(s): THYROIDECTOMY (N/A) as a surgical intervention.  The patient's history has been reviewed, patient examined, no change in status, stable for surgery.  I have reviewed the patient's chart and labs.  Questions were answered to the patient's satisfaction.     Austin Vincent

## 2022-07-03 NOTE — Discharge Instructions (Signed)
You may shower and use soap and water. Do not use any creams, oils or ointment.  Resume Plavix on Saturday

## 2022-07-03 NOTE — Op Note (Signed)
OPERATIVE REPORT  DATE OF SURGERY: 07/03/2022  PATIENT:  Austin Vincent,  51 y.o. male  PRE-OPERATIVE DIAGNOSIS:  Medullary thyroid carcinoma  POST-OPERATIVE DIAGNOSIS:  Medullary thyroid carcinoma  PROCEDURE:  Procedure(s): TOTAL THYROIDECTOMY  SURGEON:  Beckie Salts, MD  ASSISTANTS: RNFA  ANESTHESIA:   General   EBL:  60 ml  DRAINS: 7 Fr round JP  LOCAL MEDICATIONS USED:  None  SPECIMEN: 1.  Total thyroidectomy, suture marks left lobe. 2.  Remaining tissue around the right tracheoesophageal groove.  COUNTS:  Correct  PROCEDURE DETAILS: The patient was taken to the operating room and placed on the operating table in the supine position. A shoulder roll was placed beneath the shoulder blades and the neck was extended. The neck was prepped and draped in a standard fashion. A low collar transverse incision was outlined with a marking pen and was incised with electrocautery. Dissection was continued down through the platysma layer.  Self-retaining retractors were used throughout the case.  The midline fascia was divided.  The strap muscles were reflected laterally exposing the thyroid.  The left side was dissected first.  The superior pole was brought down and the superior vasculature was separately identified and ligated between clamps using the harmonic dissector.  The dissection was in a plane adjacent to the capsule of the gland.  Middle thyroid vein was ligated.  The inferior vasculature was ligated in a similar fashion.  The recurrent nerve was identified exiting the tracheoesophageal groove.  This was preserved.  Parathyroid glands were not separately identified.  The left lobe was soft and free of any neoplasm.  The right side was dissected next.  The superior vasculature was treated in a similar fashion.  There is a large mass encompassing the majority of the superior aspect of the right lobe.  This was within the right lobe and was kept intact.  Middle thyroid vein and  inferior vasculature were ligated also with the harmonic dissector.  The gland was brought forward off of Berry's ligament on both sides and off of the trachea.  There is some residual tissue along the right tracheoesophageal groove just lateral to the recurrent nerve and this was taken separately avoiding the nerve and this was sent for pathologic evaluation.  Parathyroids were not identified in the right side either.  The wound was irrigated with saline.  Hemostasis was completed using bipolar cautery.  No other masses or lymph nodes were identified in the pretracheal region.  The drain was exited through a separate stab incision laterally.  Clear.   The drain was secured in place with a nylon suture. The midline fascia was reapproximated with interrupted chromic suture. The platysma layer was also reapproximated with interrupted chromic suture. A running subcuticular closure was accomplished. Dermabond was used on the skin. The drain was charged. The patient was awakened, extubated and transferred to recovery in stable condition.   PATIENT DISPOSITION:  To PACU, stable

## 2022-07-03 NOTE — Transfer of Care (Signed)
Immediate Anesthesia Transfer of Care Note  Patient: Austin Vincent  Procedure(s) Performed: TOTAL THYROIDECTOMY (Neck)  Patient Location: PACU  Anesthesia Type:General  Level of Consciousness: awake, alert , oriented, patient cooperative and responds to stimulation  Airway & Oxygen Therapy: Patient Spontanous Breathing  Post-op Assessment: Report given to RN, Post -op Vital signs reviewed and stable and Patient moving all extremities X 4  Post vital signs: Reviewed and stable  Last Vitals:  Vitals Value Taken Time  BP 142/87 07/03/22 1152  Temp    Pulse 77 07/03/22 1156  Resp 12 07/03/22 1156  SpO2 97 % 07/03/22 1156  Vitals shown include unvalidated device data.  Last Pain:  Vitals:   07/03/22 0840  TempSrc:   PainSc: 0-No pain         Complications: No notable events documented.

## 2022-07-03 NOTE — Anesthesia Procedure Notes (Signed)
Procedure Name: Intubation Date/Time: 07/03/2022 10:22 AM  Performed by: Anastasio Auerbach, CRNAPre-anesthesia Checklist: Patient identified, Emergency Drugs available, Suction available and Patient being monitored Patient Re-evaluated:Patient Re-evaluated prior to induction Oxygen Delivery Method: Circle system utilized Preoxygenation: Pre-oxygenation with 100% oxygen Induction Type: IV induction Ventilation: Mask ventilation without difficulty Laryngoscope Size: Mac and 3 Grade View: Grade I Tube type: Oral Number of attempts: 1 Airway Equipment and Method: Stylet and Oral airway Placement Confirmation: ETT inserted through vocal cords under direct vision, positive ETCO2 and breath sounds checked- equal and bilateral Secured at: 22 cm Tube secured with: Tape Dental Injury: Teeth and Oropharynx as per pre-operative assessment

## 2022-07-03 NOTE — Anesthesia Postprocedure Evaluation (Signed)
Anesthesia Post Note  Patient: Austin Vincent  Procedure(s) Performed: TOTAL THYROIDECTOMY (Neck)     Patient location during evaluation: PACU Anesthesia Type: General Level of consciousness: awake and alert, oriented and patient cooperative Pain management: pain level controlled Vital Signs Assessment: post-procedure vital signs reviewed and stable Respiratory status: spontaneous breathing, nonlabored ventilation and respiratory function stable Cardiovascular status: blood pressure returned to baseline and stable Postop Assessment: no apparent nausea or vomiting Anesthetic complications: no   No notable events documented.  Last Vitals:  Vitals:   07/03/22 0758 07/03/22 1152  BP: (!) 144/83 (!) 142/87  Pulse: 64 80  Resp: 20 (!) 23  Temp: 36.6 C 36.6 C  SpO2: 98% 98%    Last Pain:  Vitals:   07/03/22 1152  TempSrc:   PainSc: [place]

## 2022-07-04 ENCOUNTER — Encounter (HOSPITAL_COMMUNITY): Payer: Self-pay | Admitting: Otolaryngology

## 2022-07-04 DIAGNOSIS — Z79899 Other long term (current) drug therapy: Secondary | ICD-10-CM | POA: Diagnosis not present

## 2022-07-04 DIAGNOSIS — C73 Malignant neoplasm of thyroid gland: Secondary | ICD-10-CM | POA: Diagnosis not present

## 2022-07-04 DIAGNOSIS — Z87891 Personal history of nicotine dependence: Secondary | ICD-10-CM | POA: Diagnosis not present

## 2022-07-04 LAB — CALCIUM: Calcium: 9.3 mg/dL (ref 8.9–10.3)

## 2022-07-04 NOTE — Discharge Summary (Signed)
Physician Discharge Summary  Patient ID: MOXON MESSLER MRN: [id number] DOB/AGE: [birth year] 51 y.o.  Admit date: 07/03/2022 Discharge date: 07/04/2022  Admission Diagnoses: Medullary thyroid carcinoma  Discharge Diagnoses:  Principal Problem:   Medullary thyroid carcinoma [facility])   Discharged Condition: good  Hospital Course: No complications  Consults: none  Significant Diagnostic Studies: none  Treatments: surgery: Total thyroidectomy  Discharge Exam: Blood pressure (!) 115/97, pulse 83, temperature 98.6 F (37 C), temperature source Oral, resp. rate 17, height '5\' 8"'$  (1.727 m), weight 71.2 kg, SpO2 98 %. PHYSICAL EXAM: Awake and alert, voice normal.  No swelling.  Incision intact.  Drain removed.  Calcium stable.  Disposition: Discharge disposition: 01-Home or Self Care       Discharge Instructions     Diet - low sodium heart healthy   Complete by: As directed    Discharge wound care:   Complete by: As directed    Keep dressing on if there is any drainage.  Otherwise keep it open.  Okay to shower, and use soap and water and shampoo starting Friday.   Increase activity slowly   Complete by: As directed       Allergies as of 07/04/2022   No Known Allergies      Medication List     TAKE these medications    aspirin 81 MG tablet Take 81 mg by mouth every evening.   atorvastatin 80 MG tablet Commonly known as: LIPITOR Take 1 tablet (80 mg total) by mouth daily. What changed: when to take this   clopidogrel 75 MG tablet Commonly known as: PLAVIX Take 1 tablet (75 mg total) by mouth daily. What changed: when to take this   ezetimibe 10 MG tablet Commonly known as: ZETIA Take 1 tablet (10 mg total) by mouth daily. What changed: when to take this   HYDROcodone-acetaminophen 7.5-325 MG tablet Commonly known as: Norco Take 1 tablet by mouth every 6 (six) hours as needed for moderate pain.   ibuprofen 200 MG tablet Commonly known as: ADVIL Take  200 mg by mouth every 6 (six) hours as needed for headache or moderate pain.   levothyroxine 112 MCG tablet Commonly known as: SYNTHROID Take 112 mcg by mouth daily before breakfast.   metoprolol tartrate 25 MG tablet Commonly known as: LOPRESSOR Take 1/2 tablets (12.5 mg total) by mouth 2 (two) times daily.   ondansetron 4 MG disintegrating tablet Commonly known as: ZOFRAN-ODT Take 1 tablet (4 mg total) by mouth every 8 (eight) hours as needed for nausea or vomiting.   tamsulosin 0.4 MG Caps capsule Commonly known as: FLOMAX Take 1 capsule (0.4 mg total) by mouth daily.               Discharge Care Instructions  (From admission, onward)           Start     Ordered   07/04/22 0000  Discharge wound care:       Comments: Keep dressing on if there is any drainage.  Otherwise keep it open.  Okay to shower, and use soap and water and shampoo starting Friday.   07/04/22 0849             Signed: Izora Gala 07/04/2022, 8:49 AM

## 2022-07-08 ENCOUNTER — Ambulatory Visit: Payer: Self-pay | Admitting: Cardiothoracic Surgery

## 2022-07-08 LAB — SURGICAL PATHOLOGY

## 2022-07-09 ENCOUNTER — Ambulatory Visit (INDEPENDENT_AMBULATORY_CARE_PROVIDER_SITE_OTHER): Payer: Self-pay | Admitting: Cardiothoracic Surgery

## 2022-07-09 ENCOUNTER — Encounter: Payer: Self-pay | Admitting: Cardiothoracic Surgery

## 2022-07-09 VITALS — BP 133/84 | HR 62 | Resp 20 | Ht 68.0 in | Wt 165.1 lb

## 2022-07-09 DIAGNOSIS — Z4889 Encounter for other specified surgical aftercare: Secondary | ICD-10-CM | POA: Insufficient documentation

## 2022-07-09 DIAGNOSIS — Z951 Presence of aortocoronary bypass graft: Secondary | ICD-10-CM

## 2022-07-09 NOTE — Progress Notes (Signed)
HPI Final postop visit almost 3 months after urgent CABG for unstable angina and multivessel CAD.  Patient has had no recurrent angina.  He recently had a total thyroidectomy for medullary thyroid cancer.  He tolerated that well without any cardiac problems.  He will be resuming his Plavix 1 week postop. Patient understands importance of heart healthy lifestyle and heart healthy diet.  As of September 2 he will be off sternal restrictions.  Current Outpatient Medications  Medication Sig Dispense Refill   aspirin 81 MG tablet Take 81 mg by mouth every evening.     atorvastatin (LIPITOR) 80 MG tablet Take 1 tablet (80 mg total) by mouth daily. (Patient taking differently: Take 80 mg by mouth every evening.)     clopidogrel (PLAVIX) 75 MG tablet Take 1 tablet (75 mg total) by mouth daily. (Patient taking differently: Take 75 mg by mouth every evening.) 30 tablet 3   ezetimibe (ZETIA) 10 MG tablet Take 1 tablet (10 mg total) by mouth daily. (Patient taking differently: Take 10 mg by mouth every evening.) 90 tablet 3   HYDROcodone-acetaminophen (NORCO) 7.5-325 MG tablet Take 1 tablet by mouth every 6 (six) hours as needed for moderate pain. 20 tablet 0   ibuprofen (ADVIL) 200 MG tablet Take 200 mg by mouth every 6 (six) hours as needed for headache or moderate pain.     levothyroxine (SYNTHROID) 112 MCG tablet Take 112 mcg by mouth daily before breakfast.     metoprolol tartrate (LOPRESSOR) 25 MG tablet Take 1/2 tablets (12.5 mg total) by mouth 2 (two) times daily. 60 tablet 3   ondansetron (ZOFRAN-ODT) 4 MG disintegrating tablet Take 1 tablet (4 mg total) by mouth every 8 (eight) hours as needed for nausea or vomiting. 20 tablet 0   tamsulosin (FLOMAX) 0.4 MG CAPS capsule Take 1 capsule (0.4 mg total) by mouth daily. 14 capsule 0   No current facility-administered medications for this visit.     Review of Systems:   Physical Exam  Blood pressure 133/84, pulse 62, resp. rate 20, height '5\' 8"'$   (1.727 m), weight 165 lb 1.6 oz (74.9 kg), SpO2 97 %.       Exam    General- alert and comfortable    Neck- no JVD, no cervical adenopathy palpable, no carotid bruit   Lungs- clear without rales, wheezes   Cor- regular rate and rhythm, no murmur , gallop   Abdomen- soft, non-tender   Extremities - warm, non-tender, minimal edema   Neuro- oriented, appropriate, no focal weakness   Diagnostic Tests:  None Impression: Patient doing well 3 months status post multivessel CABG for unstable angina.  Plan: Continue current medications and heart healthy lifestyle.  Return as needed.  Sternal restrictions lifted September 2.    [name], MD [facility] [phone]

## 2022-07-12 ENCOUNTER — Other Ambulatory Visit: Payer: Self-pay | Admitting: Internal Medicine

## 2022-07-12 DIAGNOSIS — Z8042 Family history of malignant neoplasm of prostate: Secondary | ICD-10-CM | POA: Diagnosis not present

## 2022-07-12 DIAGNOSIS — C73 Malignant neoplasm of thyroid gland: Secondary | ICD-10-CM | POA: Diagnosis not present

## 2022-07-12 DIAGNOSIS — Z87442 Personal history of urinary calculi: Secondary | ICD-10-CM | POA: Diagnosis not present

## 2022-07-12 DIAGNOSIS — Z809 Family history of malignant neoplasm, unspecified: Secondary | ICD-10-CM | POA: Diagnosis not present

## 2022-07-16 ENCOUNTER — Telehealth: Payer: Self-pay | Admitting: Genetic Counselor

## 2022-07-16 NOTE — Telephone Encounter (Signed)
Scheduled appt per 9/5 referral. Pt is aware of appt date and time. Pt is aware to arrive 15 mins prior to appt time and to bring and updated insurance card. Pt is aware of appt location.   

## 2022-07-30 ENCOUNTER — Other Ambulatory Visit: Payer: Self-pay | Admitting: Genetic Counselor

## 2022-07-30 ENCOUNTER — Encounter: Payer: Self-pay | Admitting: Genetic Counselor

## 2022-07-30 DIAGNOSIS — C73 Malignant neoplasm of thyroid gland: Secondary | ICD-10-CM

## 2022-07-31 ENCOUNTER — Inpatient Hospital Stay: Payer: BC Managed Care – PPO | Attending: Genetic Counselor | Admitting: Genetic Counselor

## 2022-07-31 ENCOUNTER — Inpatient Hospital Stay: Payer: BC Managed Care – PPO

## 2022-07-31 ENCOUNTER — Other Ambulatory Visit: Payer: Self-pay

## 2022-07-31 ENCOUNTER — Encounter: Payer: Self-pay | Admitting: Genetic Counselor

## 2022-07-31 DIAGNOSIS — Z8042 Family history of malignant neoplasm of prostate: Secondary | ICD-10-CM

## 2022-07-31 DIAGNOSIS — C73 Malignant neoplasm of thyroid gland: Secondary | ICD-10-CM

## 2022-07-31 DIAGNOSIS — Z803 Family history of malignant neoplasm of breast: Secondary | ICD-10-CM | POA: Diagnosis not present

## 2022-07-31 LAB — GENETIC SCREENING ORDER

## 2022-07-31 NOTE — Progress Notes (Signed)
REFERRING PROVIDER: Delrae Rend, MD [address] [place],  [place] [address]  PRIMARY PROVIDER:  Haydee Salter, MD  PRIMARY REASON FOR VISIT:  1. Family history of breast cancer   2. Family history of prostate cancer   3. Medullary thyroid carcinoma (HCC)      HISTORY OF PRESENT ILLNESS:   Austin Vincent, a 51 y.o. male, was seen for a [facility] cancer genetics consultation at the request of Dr. Buddy Duty due to a personal and family history of cancer.  Austin Vincent presents to clinic today to discuss the possibility of a hereditary predisposition to cancer, genetic testing, and to further clarify his future cancer risks, as well as potential cancer risks for family members.   In 2023, at the age of 1, Austin Vincent was diagnosed with Medullary Thyroid Cancer (MTC). The treatment plan included thyroidectomy.  He is being worked up to determine if he needs chemotherapy and/or radiation.  The patient has a strong history of heart disease.  He reports having a heart attack at ages 67, 89 and 34, and has a LDL of 195. He recently went through a triple bypass.   CANCER HISTORY:  Oncology History   No history exists.     RISK FACTORS:  Colonoscopy: yes; normal. Any excessive radiation exposure in the past: no  Past Medical History:  Diagnosis Date   CAD (coronary artery disease)    a. s/p MI @ age 50 with PCI of the RCA;  b. CTO of the LAD;  c. 11/2013 NSTEMI/PCI: LM nl, LAD 100 L->L collats, LCX 50p, OM2 80p, OM1 60ost, RCA 100p (3.5x38 Promus DES), RPL 99/60m PTCA only, EF 40-50%;  c. 11/2013 Echo: EF 50-55%, Gr1 DD, mild MR.   Cancer [facility]) 2023   thyroid   Family history of breast cancer    Family history of prostate cancer    HYPERLIPIDEMIA    HYPERTENSION    Kidney stone 11/2021   MITRAL REGURGITATION    Myocardial infarction [facility])    at age [age]   Tobacco abuse     Past Surgical History:  Procedure Laterality Date   COLONOSCOPY  02/12/2022    CORONARY ARTERY BYPASS GRAFT N/A 04/12/2022   Procedure: CORONARY ARTERY BYPASS GRAFTING (CABG) X FOUR BYPASSES USING OPEN LEFT INTERNAL MAMMARY ARTERY, OPEN LEFT RADIAL ARTERY, AND ENDOSOCPIC RIGHT AND LEFT GREATER SAPHENOUS VEIN HARVEST.;  Surgeon: [name], MD;  Location: [facility];  Service: Open Heart Surgery;  Laterality: N/A;   CORONARY STENT PLACEMENT     LEFT HEART CATH AND CORONARY ANGIOGRAPHY N/A 04/09/2022   Procedure: LEFT HEART CATH AND CORONARY ANGIOGRAPHY;  Surgeon: Martinique, Peter M, MD;  Location: [facility] CV LAB;  Service: Cardiovascular;  Laterality: N/A;   LEFT HEART CATHETERIZATION WITH CORONARY ANGIOGRAM N/A 12/01/2013   Procedure: LEFT HEART CATHETERIZATION WITH CORONARY ANGIOGRAM;  Surgeon: Leonie Man, MD;  Location: [facility] CATH LAB;  Service: Cardiovascular;  Laterality: N/A;   LITHOTRIPSY  2003   RADIAL ARTERY HARVEST Left 04/12/2022   Procedure: RADIAL ARTERY HARVEST;  Surgeon: [name], MD;  Location: [facility];  Service: Open Heart Surgery;  Laterality: Left;   TEE WITHOUT CARDIOVERSION N/A 04/12/2022   Procedure: TRANSESOPHAGEAL ECHOCARDIOGRAM (TEE);  Surgeon: [name], MD;  Location: [facility];  Service: Open Heart Surgery;  Laterality: N/A;   THYROIDECTOMY N/A 07/03/2022   Procedure: TOTAL THYROIDECTOMY;  Surgeon: Izora Gala, MD;  Location: [facility];  Service: ENT;  Laterality: N/A;  Social History   Socioeconomic History   Marital status: Divorced    Spouse name: Not on file   Number of children: 2   Years of education: Not on file   Highest education level: Bachelor's degree (e.g., BA, AB, BS)  Occupational History   Occupation: Hospital doctor    Comment: Actor  Tobacco Use   Smoking status: Former    Types: Cigarettes    Quit date: 2021    Years since quitting: 2.7    Passive exposure: Past (younger)   Smokeless tobacco: Former    Quit date: 04/2022   Tobacco comments:    Nicotine pouches- 10/day--no tobacco  Vaping Use    Vaping Use: Former   Quit date: 11/25/2021  Substance and Sexual Activity   Alcohol use: Yes    Alcohol/week: 2.0 standard drinks of alcohol    Types: 2 Cans of beer per week    Comment: socially   Drug use: Not Currently    Types: Marijuana    Comment: used delta 8 in powder form but hasn't used since 06/25/22   Sexual activity: Yes  Other Topics Concern   Not on file  Social History Narrative   Not on file   Social Determinants of Health   Financial Resource Strain: Not on file  Food Insecurity: Not on file  Transportation Needs: Not on file  Physical Activity: Not on file  Stress: Not on file  Social Connections: Not on file     FAMILY HISTORY:  We obtained a detailed, 4-generation family history.  Significant diagnoses are listed below: Family History  Problem Relation Age of Onset   High Cholesterol Mother    Cancer Mother        Thyms   Heart disease Mother    Hypertension Mother    COPD Mother    High Cholesterol Father    Skin cancer Father    Diabetes Father        Type 1   Heart disease Father    Hypertension Father    Prostate cancer Father 21       metastatic   Breast cancer Sister 16       DCIS   Skin cancer Sister    High Cholesterol Sister    High Cholesterol Sister    Hypertension Sister    Coronary artery disease Other    Healthy Son    Healthy Son        adopted   Colon cancer Neg Hx    Colon polyps Neg Hx    Esophageal cancer Neg Hx    Rectal cancer Neg Hx    Stomach cancer Neg Hx     The patient has a biological son and adopted son who are cancer free.  He has two sisters.  One sister had breast cancer at 40 and high cholesterol.  The other sister has high cholesterol and HTN.  Both parents are deceased.  The patient's mother had thymic cancer in her mid 71's.  She had three sisters and a brother.  The patient reports he does not know their health status.  The maternal grandparents are deceased.  The patient's father had prostate  cancer at 72 and died at 33 from metastatic prostate cancer.  He had three brothers and a sister.  The patient reports not knowing the health history of his fathers family.  Austin Vincent is unaware of previous family history of genetic testing for hereditary cancer risks. Patient's maternal ancestors  are of European descent, and paternal ancestors are of [place] descent. There is no reported Ashkenazi Jewish ancestry. There is no known consanguinity.  GENETIC COUNSELING ASSESSMENT: Austin Vincent is a 51 y.o. 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 We, therefore, discussed and recommended the following at today's visit.   DISCUSSION: We discussed that, in general, most cancer is not inherited in families, but instead is sporadic or familial. Sporadic cancers occur by chance and typically happen at older ages (>50 years) as this type of cancer is caused by genetic changes acquired during an individual's lifetime. Some families have more cancers than would be expected by chance; however, the ages or types of cancer are not consistent with a known genetic mutation or known genetic mutations have been ruled out. This type of familial cancer is thought to be due to a combination of multiple genetic, environmental, hormonal, and lifestyle factors. While this combination of factors likely increases the risk of cancer, the exact source of this risk is not currently identifiable or testable.  We discussed that 5 - 10% of cancer is hereditary.  About 10% of thyroid cancer is due to [place], with most cases of hereditary MTC associated with MEN2.  Based on the family history of breast and prostate cancer there is a slight increased risk for other hereditary cancer syndromes, although that risk is slight.    We discussed that testing is beneficial for several reasons including  knowing how to follow individuals after completing their treatment, and understand if other family members could be at risk for cancer and allow them to undergo genetic testing.   We reviewed the characteristics, features and inheritance patterns of hereditary cancer syndromes. We also discussed genetic testing, including the appropriate family members to test, the process of testing, insurance coverage and turn-around-time for results. We discussed the implications of a negative, positive, carrier and/or variant of uncertain significant result. Austin Vincent  was offered a common hereditary cancer panel (47 genes) and an expanded pan-cancer panel (77 genes). Austin Vincent was informed of the benefits and limitations of each panel, including that expanded pan-cancer panels contain genes that do not have clear management guidelines at this point in time.  We also discussed that as the number of genes included on a panel increases, the chances of variants of uncertain significance increases. Austin Vincent decided to pursue genetic testing for the CancerNext-Expanded+RNAinsight gene panel.   The CancerNext-Expanded gene panel offered by [facility] and includes sequencing and rearrangement analysis for the following 77 genes: AIP, ALK, APC*, ATM*, AXIN2, BAP1, BARD1, BLM, BMPR1A, BRCA1*, BRCA2*, BRIP1*, CDC73, CDH1*, CDK4, CDKN1B, CDKN2A, CHEK2*, CTNNA1, DICER1, FANCC, FH, FLCN, GALNT12, KIF1B, LZTR1, MAX, MEN1, MET, MLH1*, MSH2*, MSH3, MSH6*, MUTYH*, NBN, NF1*, NF2, NTHL1, PALB2*, PHOX2B, PMS2*, POT1, PRKAR1A, PTCH1, PTEN*, RAD51C*, RAD51D*, RB1, RECQL, RET, SDHA, SDHAF2, SDHB, SDHC, SDHD, SMAD4, SMARCA4, SMARCB1, SMARCE1, STK11, SUFU, TMEM127, TP53*, TSC1, TSC2, VHL and XRCC2 (sequencing and deletion/duplication); EGFR, EGLN1, HOXB13, KIT, MITF, PDGFRA, POLD1, and POLE (sequencing only); EPCAM and GREM1 (deletion/duplication only). DNA and RNA analyses performed for * genes.   Based on Mr.  Vincent personal and family history of cancer, he meets medical criteria for genetic testing. Despite that he meets criteria, he may still have an out of pocket cost. We discussed that if his out of pocket cost for testing is over $100, the  laboratory will call and confirm whether he wants to proceed with testing.  If the out of pocket cost of testing is less than $100 he will be billed by the genetic testing laboratory.   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  We discussed that about 1 in 100 individuals are affected with this condition, and it is important to identify as men with this condition are at risk for early death due to heart attacks. He does not report having a family history of early death, but there is a history of high cholesterol.  We have referred him to the hereditary cardiology clinic at [facility].  PLAN: After considering the risks, benefits, and limitations, Austin Vincent provided informed consent to pursue genetic testing and the blood sample was sent to [facility] for analysis of the CancerNext-Expanded+RNAinsight. Results should be available within approximately 2-3 weeks' time, at which point they will be disclosed by telephone to Austin Vincent, as will any additional recommendations warranted by these results. Austin Vincent will receive a summary of his genetic counseling visit and a copy of his results once available. This information will also be available in Epic.   Lastly, we encouraged Austin Vincent to remain in contact with cancer genetics annually so that we can continuously update the family history and inform him of any changes in cancer genetics and testing that may be of benefit for this family.   Austin Vincent questions were answered to his satisfaction today. Our contact information was  provided should additional questions or concerns arise. Thank you for the referral and allowing [place] to share in the care of your patient.   Jilleen Essner P. Florene Glen, [place], [facility] Licensed, Insurance risk surveyor [email or web address] phone: [phone]  The patient was seen for a total of 45 minutes in face-to-face genetic counseling.  The patient brought his girlfriend, Austin Vincent. Drs. Michell Heinrich, and/or [place] were available for questions, if needed..    _______________________________________________________________________ For Office Staff:  Number of people involved in session: 2 Was an Intern/ student involved with case: no

## 2022-08-02 ENCOUNTER — Ambulatory Visit
Admission: RE | Admit: 2022-08-02 | Discharge: 2022-08-02 | Disposition: A | Discharge status: Home / Self Care | Payer: BC Managed Care – PPO | Source: Ambulatory Visit | Attending: Internal Medicine | Admitting: Internal Medicine

## 2022-08-02 DIAGNOSIS — R590 Localized enlarged lymph nodes: Secondary | ICD-10-CM | POA: Diagnosis not present

## 2022-08-02 DIAGNOSIS — Z85850 Personal history of malignant neoplasm of thyroid: Secondary | ICD-10-CM | POA: Diagnosis not present

## 2022-08-02 DIAGNOSIS — E890 Postprocedural hypothyroidism: Secondary | ICD-10-CM | POA: Diagnosis not present

## 2022-08-02 DIAGNOSIS — C73 Malignant neoplasm of thyroid gland: Secondary | ICD-10-CM

## 2022-08-13 DIAGNOSIS — E890 Postprocedural hypothyroidism: Secondary | ICD-10-CM | POA: Diagnosis not present

## 2022-08-15 ENCOUNTER — Ambulatory Visit: Payer: Self-pay | Admitting: Genetic Counselor

## 2022-08-15 ENCOUNTER — Encounter: Payer: Self-pay | Admitting: Genetic Counselor

## 2022-08-15 ENCOUNTER — Telehealth: Payer: Self-pay | Admitting: Genetic Counselor

## 2022-08-15 DIAGNOSIS — Z1379 Encounter for other screening for genetic and chromosomal anomalies: Secondary | ICD-10-CM | POA: Insufficient documentation

## 2022-08-15 NOTE — Telephone Encounter (Signed)
Revealed negative genetic testing.  Discussed that we do not know why he has Medullary Thyroid cancer or why there is cancer in the family. It could be due to a different gene that we are not testing, or maybe our current technology may not be able to pick something up.  It will be important for him to keep in contact with genetics to keep up with whether additional testing may be needed.

## 2022-08-15 NOTE — Progress Notes (Signed)
HPI:  Mr. Bonn was previously seen in the [facility] due to a personal and family history of cancer and concerns regarding a hereditary predisposition to cancer. Please refer to our prior cancer genetics clinic note for more information regarding our discussion, assessment and recommendations, at the time. Mr. Newsham recent genetic test results were disclosed to him, as were recommendations warranted by these results. These results and recommendations are discussed in more detail below.  CANCER HISTORY:  Oncology History   No history exists.    FAMILY HISTORY:  We obtained a detailed, 4-generation family history.  Significant diagnoses are listed below: Family History  Problem Relation Age of Onset   High Cholesterol Mother    Cancer Mother        Thyms   Heart disease Mother    Hypertension Mother    COPD Mother    High Cholesterol Father    Skin cancer Father    Diabetes Father        Type 1   Heart disease Father    Hypertension Father    Prostate cancer Father 33       metastatic   Breast cancer Sister 67       DCIS   Skin cancer Sister    High Cholesterol Sister    High Cholesterol Sister    Hypertension Sister    Coronary artery disease Other    Healthy Son    Healthy Son        adopted   Colon cancer Neg Hx    Colon polyps Neg Hx    Esophageal cancer Neg Hx    Rectal cancer Neg Hx    Stomach cancer Neg Hx      The patient has a biological son and adopted son who are cancer free.  He has two sisters.  One sister had breast cancer at 69 and high cholesterol.  The other sister has high cholesterol and HTN.  Both parents are deceased.   The patient's mother had thymic cancer in her mid 69's.  She had three sisters and a brother.  The patient reports he does not know their health status.  The maternal grandparents are deceased.   The patient's father had prostate cancer at 80 and died at 88 from metastatic prostate cancer.  He had  three brothers and a sister.  The patient reports not knowing the health history of his fathers family.   Mr. Sapia is unaware of previous family history of genetic testing for hereditary cancer risks. Patient's maternal ancestors are of European descent, and paternal ancestors are of [place] descent. There is no reported Ashkenazi Jewish ancestry. There is no known consanguinity.  GENETIC TEST RESULTS: Genetic testing reported out on August 14, 2022 through the CancerNext-Expanded+RNAinsight cancer panel found no pathogenic mutations. The CancerNext-Expanded gene panel offered by [facility] and includes sequencing and rearrangement analysis for the following 77 genes: AIP, ALK, APC*, ATM*, AXIN2, BAP1, BARD1, BLM, BMPR1A, BRCA1*, BRCA2*, BRIP1*, CDC73, CDH1*, CDK4, CDKN1B, CDKN2A, CHEK2*, CTNNA1, DICER1, FANCC, FH, FLCN, GALNT12, KIF1B, LZTR1, MAX, MEN1, MET, MLH1*, MSH2*, MSH3, MSH6*, MUTYH*, NBN, NF1*, NF2, NTHL1, PALB2*, PHOX2B, PMS2*, POT1, PRKAR1A, PTCH1, PTEN*, RAD51C*, RAD51D*, RB1, RECQL, RET, SDHA, SDHAF2, SDHB, SDHC, SDHD, SMAD4, SMARCA4, SMARCB1, SMARCE1, STK11, SUFU, TMEM127, TP53*, TSC1, TSC2, VHL and XRCC2 (sequencing and deletion/duplication); EGFR, EGLN1, HOXB13, KIT, MITF, PDGFRA, POLD1, and POLE (sequencing only); EPCAM and GREM1 (deletion/duplication only). DNA and RNA analyses performed for * genes. The test report  has been scanned into EPIC and is located under the Molecular Pathology section of the Results Review tab.  A portion of the result report is included below for reference.     We discussed with Mr. Mizrahi that because current genetic testing is not perfect, it is possible there may be a gene mutation in one of these genes that current testing cannot detect, but that chance is small.  We also discussed, that there could be another gene that has not yet been discovered, or that we have not yet tested, that is responsible for the cancer diagnoses in the family. It  is also possible there is a hereditary cause for the cancer in the family that Mr. Moure did not inherit and therefore was not identified in his testing.  Therefore, it is important to remain in touch with cancer genetics in the future so that we can continue to offer Mr. Bartnick the most up to date genetic testing.   ADDITIONAL GENETIC TESTING: We discussed with Mr. Payes that his genetic testing was fairly extensive.  If there are genes identified to increase cancer risk that can be analyzed in the future, we would be happy to discuss and coordinate this testing at that time.    CANCER SCREENING RECOMMENDATIONS: Mr. Yepiz test result is considered negative (normal).  This means that we have not identified a hereditary cause for his personal and family history of cancer at this time. Most cancers happen by chance and this negative test suggests that his cancer may fall into this category.    While reassuring, this does not definitively rule out a hereditary predisposition to cancer. It is still possible that there could be genetic mutations that are undetectable by current technology. There could be genetic mutations in genes that have not been tested or identified to increase cancer risk.  Therefore, it is recommended he continue to follow the cancer management and screening guidelines provided by his oncology, endocrinologist and primary healthcare provider.   An individual's cancer risk and medical management are not determined by genetic test results alone. Overall cancer risk assessment incorporates additional factors, including personal medical history, family history, and any available genetic information that may result in a personalized plan for cancer prevention and surveillance  RECOMMENDATIONS FOR FAMILY MEMBERS:  Individuals in this family might be at some increased risk of developing cancer, over the general population risk, simply due to the family history of cancer.   We recommended women in this family have a yearly mammogram beginning at age 32, or 70 years younger than the earliest onset of cancer, an annual clinical breast exam, and perform monthly breast self-exams. Women in this family should also have a gynecological exam as recommended by their primary provider. All family members should be referred for colonoscopy starting at age 84.  FOLLOW-UP: Lastly, we discussed with Mr. Puleo that cancer genetics is a rapidly advancing field and it is possible that new genetic tests will be appropriate for him and/or his family members in the future. We encouraged him to remain in contact with cancer genetics on an annual basis so we can update his personal and family histories and let him know of advances in cancer genetics that may benefit this family.   Our contact number was provided. Mr. Butsch's questions were answered to his satisfaction, and he knows he is welcome to call us at anytime with additional questions or concerns.   Maylon Cos, MS, [facility] Licensed, Certified Genetic Counselor [email or web address]

## 2022-08-20 ENCOUNTER — Ambulatory Visit: Payer: BC Managed Care – PPO | Attending: Genetic Counselor | Admitting: Genetic Counselor

## 2022-08-20 DIAGNOSIS — E7800 Pure hypercholesterolemia, unspecified: Secondary | ICD-10-CM

## 2022-08-21 ENCOUNTER — Telehealth: Payer: Self-pay | Admitting: *Deleted

## 2022-08-21 DIAGNOSIS — E7800 Pure hypercholesterolemia, unspecified: Secondary | ICD-10-CM

## 2022-08-21 NOTE — Telephone Encounter (Signed)
-----  Message from Brian S Crenshaw, MD sent at 08/20/2022  5:12 PM EDT ----- Regarding: FW: FH genetic consult and test order Can you put this order in; I'm not sure how; I can sign; thanks Brian Crenshaw  ----- Message ----- From: Joseph, Sumy M, PhD Sent: 08/20/2022   4:40 PM EDT To: Brian S Crenshaw, MD Subject: FH genetic consult and test order              Dear Dr. 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  I believe FH genetic testing is appropriate given that he presented with CAD at 27 and had a 4 vessel CABG a few years later. He is willing to proceed with FH genetic testing and had his blood drawn today.   To initiate genetic testing, we will need a test order for FH in the system. Can you please place the order so we can submit it for his test prior authorization.  I will route my notes to you as soon as it is done.  Thanks for your help  Sumy   

## 2022-08-21 NOTE — Progress Notes (Signed)
Referring Provider: Roma Kayser,, [place] (ROUTE TO Kirk Ruths, MD)     Pre-test Genetic Consult notes  Referral Reason Austin Vincent was referred for genetic consult and testing of familial hypercholesterolemia (FH) by his cancer genetic counsellor upon recognizing a dramatic personal and family history of coronary heart disease  Austin Vincent (III.3 on pedigree) is a pleasant 51-year Caucasian gentleman who works as a Landscape architect for [employer].  He reports having episodic heart burn, at age 51, one day at work while living in [facility] and tells me that he went to urgent care as the frequency and severity of heartburn increased. An EKG was suspicious of a M.I and upon going to the ER, he was admitted for a M.I and had 2 stents placed. Later, at age 51, he had another M.I and  had a new stent placed in another coronary artery.   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 Upon being evaluated for clearance, he was noted to have severe CAD and underwent a 3-vessel CABG. Reports being in the hospital for 10 days and now feels much better.  He reports taking statins since his first M.I. Denies having corneal arcus or xanthomas  Risk Factors Austin Vincent denies having hypothyroidism, renal or hepatic dysfunction that can also lead to elevated LDL-C. He has been smoking since the age of 13 and 2 years ago. He was then using a nicotine pouch and reports that he has been nicotine free for the last 4-5 months. Also, reports being diagnosed with HTN at the time of his M.I. at age 51.  He reports that his diet is ok but does walk 4-5 miles regularly.     Family history Relation to Proband Pedigree # Current age CVD/Age of onset Notes  Son IV.3 67 None Normal lipid panel @ 51 (has an adopted brother from [place], age 71)   Sister, 1 III.1 64 Hyperlipidemia @ 53 Heavy smoker, No adverse events of M.I etc.  Sister, 2 III.2 60 Hyperlipidemia @ 53 Heavy smoker, No adverse events of M.I etc.  Nephew, niece IV.1, IV.2 8, 66 None         Father II.6 Deceased M.I @ 70s, 3 vessel CABG @ 50s Heavy smoker. Died @ 82 from prostate cancer that metastasized to bones  Paternal uncles and aunt II.1-II.5 Deceased Unaware d.?  Paternal grandfather I.1 Deceased Unaware d. old age  Paternal grandmother I.2 Deceased Unaware d. old age        Mother II.7 Deceased None Died @ 32- sepsis from hospital acquired infection, heavy smoker  Maternal aunt, 1 II.8 90s None Good cardiac health  Maternal aunts-2x II.9-II.10 Deceased Unaware d.?  Maternal uncle II.11 Deceased Unaware Died of unknown causes @ 20s/30s  Maternal grandfather I.3 Deceased Unaware d.?  Maternal grandmother I.4 Deceased Unaware d.?   Genetic Consultation Notes  I explained to Austin Vincent that the characteristic features of a genetic condition include absence of risk factors that can elevate LDL-C, early age of presentation, increased disease severity and family history of the condition with coronary heart disease developing in untreated males before age 53 and females before age 80. The clinical manifestations of FH were also reviewed.   I discussed the molecular pathogenesis of FH. I informed her that [place] is primarily caused by pathogenic variants in three genes, namely APOB,  LDLR and PCSK9. These pathogenic variants impact LDLR synthesis, degradation and recycling in cells and results in elevated LDL-C levels. We then walked through autosomal dominant inheritance pattern and viewed pedigree of families with heterozygous FH (HeFH) and homozygous FH (HoFH). Explained to her that digenic or compound heterozygous mutations in APOB, LDLR and PCSK9 genes can cause HoFH.   We reviewed the likely outcomes of FH genetic testing. Based on the diagnostic criteria for FH, yields can  range from 50%-90%. A positive yield is observed in ~63% of patients with a definite clinical diagnosis of FH. A negative test does not exclude a genetic basis for FH. In some cases, polygenic inheritance, where more than an average number of common variants, each with small effect, are known to increase plasma lipid levels. Additionally, limitations in current genetic testing methodology can produce a negative result. Variants of unknown significance (VUS) can be seen in some cases. I explained that typically a VUS is so classified if the variant is not well understood as very few individuals have been reported to harbor this variant or its role in gene function has not been elucidated. Screening other first-degree family members by genetic testing was also discussed. Additionally, we briefly touched upon the molecular basis of the different treatment modalities that are currently available.  Impression  In summary, Austin Vincent was found to have severe hypercholesterolemia of ~300 at age 51 with CHD manifesting at ages 51, 51 and 51. Additionally, there is significant family history of M.I and CABG in his father before age 53 and sisters with hyperlipidemia before the age of 53. It is highly likely that he has a genetic condition that is inherited from his father. However, he does report risk factor of heavy smoking from a very young age that does confound his diagnosis. Genetic testing for familial hypercholesterolemia is recommended to identify the pathogenic variant. Genetic testing should evaluate the major genes implicated in [place].  In addition, we discussed the protections afforded by the Genetic Information Non-Discrimination Act (GINA). GINA protects him from losing employment or health insurance based on his genotype. However, these protections do not cover life insurance and disability. He verbalized understanding of this and states that his son does not have life insurance.  Please note that the patient  has not been counseled in this visit on other personal, cultural, or ethical issues that he may face due to his heart condition.   Plans After a thorough discussion of the risk and benefits of genetic testing for FH, Austin Vincent expresses his intent to pursue FH genetic testing and signed the informed consent form. Blood was drawn today for testing.    Austin Vincent, Ph.D, [facility] Clinical Molecular Geneticist

## 2022-08-22 ENCOUNTER — Other Ambulatory Visit: Payer: Self-pay | Admitting: Physician Assistant

## 2022-08-23 NOTE — Telephone Encounter (Signed)
Dr Broadus John, can you please tell me what the order is and I can get it into the system for you.

## 2022-08-28 ENCOUNTER — Other Ambulatory Visit: Payer: Self-pay | Admitting: *Deleted

## 2022-08-28 DIAGNOSIS — C73 Malignant neoplasm of thyroid gland: Secondary | ICD-10-CM

## 2022-08-28 NOTE — Telephone Encounter (Signed)
Order placed for genetic testing.

## 2022-08-29 ENCOUNTER — Encounter: Payer: Self-pay | Admitting: *Deleted

## 2022-09-01 ENCOUNTER — Other Ambulatory Visit: Payer: Self-pay | Admitting: Physician Assistant

## 2022-09-02 ENCOUNTER — Encounter: Payer: Self-pay | Admitting: Cardiology

## 2022-09-02 MED ORDER — CLOPIDOGREL BISULFATE 75 MG PO TABS: 75.0000 mg | ORAL_TABLET | Freq: Every day | ORAL | 3 refills | Status: DC

## 2022-09-19 DIAGNOSIS — E7800 Pure hypercholesterolemia, unspecified: Secondary | ICD-10-CM | POA: Diagnosis not present

## 2022-10-11 DIAGNOSIS — E890 Postprocedural hypothyroidism: Secondary | ICD-10-CM | POA: Diagnosis not present

## 2022-10-11 DIAGNOSIS — C73 Malignant neoplasm of thyroid gland: Secondary | ICD-10-CM | POA: Diagnosis not present

## 2022-10-18 NOTE — Progress Notes (Signed)
HPI: FU coronary artery disease. Previousy diagnosed with medullary thyroid cancer; total thyroidectomy planned.  Patient admitted with syncopal episode May 2023.  Troponin elevated.  Echocardiogram showed distal septal/apical hypokinesis, ejection fraction 50 to 55%, mild left atrial enlargement.  Cardiac catheterization revealed severe three-vessel coronary artery disease with ejection fraction 50 to 55%.  Preoperative carotid Dopplers May 2023 showed near normal bilaterally.  Patient underwent coronary artery bypass and graft on April 12, 2022 with LIMA to the LAD, saphenous vein graft to the PDA, left radial artery free graft to the diagonal and saphenous vein graft to the ramus intermedius.  Since I last saw him, there is no dyspnea, chest pain or syncope.  Some chest soreness.  Current Outpatient Medications  Medication Sig Dispense Refill   aspirin 81 MG tablet Take 81 mg by mouth every evening.     atorvastatin (LIPITOR) 80 MG tablet Take 1 tablet (80 mg total) by mouth daily. (Patient taking differently: Take 80 mg by mouth every evening.)     clopidogrel (PLAVIX) 75 MG tablet Take 1 tablet (75 mg total) by mouth daily. 30 tablet 3   ezetimibe (ZETIA) 10 MG tablet Take 1 tablet (10 mg total) by mouth daily. (Patient taking differently: Take 10 mg by mouth every evening.) 90 tablet 3   levothyroxine (SYNTHROID) 112 MCG tablet Take 112 mcg by mouth daily before breakfast.     metoprolol tartrate (LOPRESSOR) 25 MG tablet Take 1/2 tablets (12.5 mg total) by mouth 2 (two) times daily. 60 tablet 3   HYDROcodone-acetaminophen (NORCO) 7.5-325 MG tablet Take 1 tablet by mouth every 6 (six) hours as needed for moderate pain. (Patient not taking: Reported on 10/29/2022) 20 tablet 0   ibuprofen (ADVIL) 200 MG tablet Take 200 mg by mouth every 6 (six) hours as needed for headache or moderate pain. (Patient not taking: Reported on 10/29/2022)     ondansetron (ZOFRAN-ODT) 4 MG disintegrating tablet Take  1 tablet (4 mg total) by mouth every 8 (eight) hours as needed for nausea or vomiting. (Patient not taking: Reported on 10/29/2022) 20 tablet 0   tamsulosin (FLOMAX) 0.4 MG CAPS capsule Take 1 capsule (0.4 mg total) by mouth daily. (Patient not taking: Reported on 10/29/2022) 14 capsule 0   No current facility-administered medications for this visit.     Past Medical History:  Diagnosis Date   CAD (coronary artery disease)    a. s/p MI @ age 74 with PCI of the RCA;  b. CTO of the LAD;  c. 11/2013 NSTEMI/PCI: LM nl, LAD 100 L->L collats, LCX 50p, OM2 80p, OM1 60ost, RCA 100p (3.5x38 Promus DES), RPL 99/76mPTCA only, EF 40-50%;  c. 11/2013 Echo: EF 50-55%, Gr1 DD, mild MR.   Cancer ([facility] 2023   thyroid   Family history of breast cancer    Family history of prostate cancer    HYPERLIPIDEMIA    HYPERTENSION    Kidney stone 11/2021   MITRAL REGURGITATION    Myocardial infarction ([facility]    at age [age]  Tobacco abuse     Past Surgical History:  Procedure Laterality Date   COLONOSCOPY  02/12/2022   CORONARY ARTERY BYPASS GRAFT N/A 04/12/2022   Procedure: CORONARY ARTERY BYPASS GRAFTING (CABG) X FOUR BYPASSES USING OPEN LEFT INTERNAL MAMMARY ARTERY, OPEN LEFT RADIAL ARTERY, AND ENDOSOCPIC RIGHT AND LEFT GREATER SAPHENOUS VEIN HARVEST.;  Surgeon: VDahlia Byes MD;  [place]  Service: Open Heart Surgery;  Laterality: N/A;  CORONARY STENT PLACEMENT     LEFT HEART CATH AND CORONARY ANGIOGRAPHY N/A 04/09/2022   Procedure: LEFT HEART CATH AND CORONARY ANGIOGRAPHY;  Surgeon: Martinique, Peter M, MD;  Location: [place] CV LAB;  Service: Cardiovascular;  Laterality: N/A;   LEFT HEART CATHETERIZATION WITH CORONARY ANGIOGRAM N/A 12/01/2013   Procedure: LEFT HEART CATHETERIZATION WITH CORONARY ANGIOGRAM;  Surgeon: Leonie Man, MD;  Location: [facility] CATH LAB;  Service: Cardiovascular;  Laterality: N/A;   LITHOTRIPSY  2003   RADIAL ARTERY HARVEST Left 04/12/2022   Procedure: RADIAL ARTERY  HARVEST;  Surgeon: [name], MD;  Location: [place];  Service: Open Heart Surgery;  Laterality: Left;   TEE WITHOUT CARDIOVERSION N/A 04/12/2022   Procedure: TRANSESOPHAGEAL ECHOCARDIOGRAM (TEE);  Surgeon: [name], MD;  Location: [place];  Service: Open Heart Surgery;  Laterality: N/A;   THYROIDECTOMY N/A 07/03/2022   Procedure: TOTAL THYROIDECTOMY;  Surgeon: Izora Gala, MD;  Location: [facility] OR;  Service: ENT;  Laterality: N/A;    Social History   Socioeconomic History   Marital status: Divorced    Spouse name: Not on file   Number of children: 2   Years of education: Not on file   Highest education level: Bachelor's degree (e.g., BA, AB, BS)  Occupational History   Occupation: Hospital doctor    Comment: Actor  Tobacco Use   Smoking status: Former    Types: Cigarettes    Quit date: 2021    Years since quitting: 2.9    Passive exposure: Past (younger)   Smokeless tobacco: Former    Quit date: 04/2022   Tobacco comments:    Nicotine pouches- 10/day--no tobacco  Vaping Use   Vaping Use: Former   Quit date: 11/25/2021  Substance and Sexual Activity   Alcohol use: Yes    Alcohol/week: 2.0 standard drinks of alcohol    Types: 2 Cans of beer per week    Comment: socially   Drug use: Not Currently    Types: Marijuana    Comment: used delta 8 in powder form but hasn't used since 06/25/22   Sexual activity: Yes  Other Topics Concern   Not on file  Social History Narrative   Not on file   Social Determinants of Health   Financial Resource Strain: Not on file  Food Insecurity: Not on file  Transportation Needs: Not on file  Physical Activity: Not on file  Stress: Not on file  Social Connections: Not on file  Intimate Partner Violence: Not on file    Family History  Problem Relation Age of Onset   High Cholesterol Mother    Cancer Mother        Thyms   Heart disease Mother    Hypertension Mother    COPD Mother    High Cholesterol Father    Skin  cancer Father    Diabetes Father        Type 1   Heart disease Father    Hypertension Father    Prostate cancer Father 32       metastatic   Breast cancer Sister 88       DCIS   Skin cancer Sister    High Cholesterol Sister    High Cholesterol Sister    Hypertension Sister    Coronary artery disease Other    Healthy Son    Healthy Son        adopted   Colon cancer Neg Hx    Colon polyps Neg Hx  Esophageal cancer Neg Hx    Rectal cancer Neg Hx    Stomach cancer Neg Hx     ROS: no fevers or chills, productive cough, hemoptysis, dysphasia, odynophagia, melena, hematochezia, dysuria, hematuria, rash, seizure activity, orthopnea, PND, pedal edema, claudication. Remaining systems are negative.  Physical Exam: Well-developed well-nourished in no acute distress.  Skin is warm and dry.  HEENT is normal.  Neck is supple.  Chest is clear to auscultation with normal expansion.  Cardiovascular exam is regular rate and rhythm.  Abdominal exam nontender or distended. No masses palpated. Extremities show no edema. neuro grossly intact   A/P  1 coronary artery disease status post coronary artery bypass graft-patient doing well with no chest pain.  Continue aspirin and statin.  Discontinue Plavix May 2024.  2 hypertension-patient's blood pressure is elevated; however it is actually running low at home.  He will follow and we will adjust medications as needed.  3 hyperlipidemia-LDL not at goal (most recent 6).  Will refer to lipid clinic for PCSK9 inhibitor.  Kirk Ruths, MD

## 2022-10-21 DIAGNOSIS — I2510 Atherosclerotic heart disease of native coronary artery without angina pectoris: Secondary | ICD-10-CM | POA: Diagnosis not present

## 2022-10-22 ENCOUNTER — Other Ambulatory Visit: Payer: Self-pay | Admitting: *Deleted

## 2022-10-22 DIAGNOSIS — E7800 Pure hypercholesterolemia, unspecified: Secondary | ICD-10-CM

## 2022-10-22 LAB — HEPATIC FUNCTION PANEL
ALT: 27 IU/L (ref 0–44)
AST: 23 IU/L (ref 0–40)
Albumin: 4.6 g/dL (ref 3.8–4.9)
Alkaline Phosphatase: 79 IU/L (ref 44–121)
Bilirubin Total: 0.6 mg/dL (ref 0.0–1.2)
Bilirubin, Direct: 0.18 mg/dL (ref 0.00–0.40)
Total Protein: 6.8 g/dL (ref 6.0–8.5)

## 2022-10-22 LAB — LIPID PANEL
Chol/HDL Ratio: 3.2 ratio (ref 0.0–5.0)
Cholesterol, Total: 134 mg/dL (ref 100–199)
HDL: 42 mg/dL (ref >39)
LDL Chol Calc (NIH): 72 mg/dL (ref 0–99)
Triglycerides: 106 mg/dL (ref 0–149)
VLDL Cholesterol Cal: 20 mg/dL (ref 5–40)

## 2022-10-24 ENCOUNTER — Encounter: Payer: Self-pay | Admitting: *Deleted

## 2022-10-24 ENCOUNTER — Other Ambulatory Visit: Payer: Self-pay | Admitting: *Deleted

## 2022-10-24 DIAGNOSIS — E7800 Pure hypercholesterolemia, unspecified: Secondary | ICD-10-CM

## 2022-10-29 ENCOUNTER — Ambulatory Visit: Payer: BC Managed Care – PPO | Attending: Cardiology | Admitting: Cardiology

## 2022-10-29 ENCOUNTER — Encounter: Payer: Self-pay | Admitting: Cardiology

## 2022-10-29 VITALS — BP 136/94 | HR 55 | Ht 68.0 in | Wt 175.0 lb

## 2022-10-29 DIAGNOSIS — E7800 Pure hypercholesterolemia, unspecified: Secondary | ICD-10-CM

## 2022-10-29 DIAGNOSIS — I10 Essential (primary) hypertension: Secondary | ICD-10-CM

## 2022-10-29 DIAGNOSIS — I2510 Atherosclerotic heart disease of native coronary artery without angina pectoris: Secondary | ICD-10-CM

## 2022-10-29 NOTE — Patient Instructions (Signed)
Medication Instructions:   STOP PLAVIX AT THE END OF MAY 2024  *If you need a refill on your cardiac medications before your next appointment, please call your pharmacy*   Follow-Up: At [facility], you and your health needs are our priority.  As part of our continuing mission to provide you with exceptional heart care, we have created designated Provider Care Teams.  These Care Teams include your primary Cardiologist (physician) and Advanced Practice Providers (APPs -  Physician Assistants and Nurse Practitioners) who all work together to provide you with the care you need, when you need it.  We recommend signing up for the patient portal called "MyChart".  Sign up information is provided on this After Visit Summary.  MyChart is used to connect with patients for Virtual Visits (Telemedicine).  Patients are able to view lab/test results, encounter notes, upcoming appointments, etc.  Non-urgent messages can be sent to your provider as well.   To learn more about what you can do with MyChart, go to [email or web address].    Your next appointment:   12 month(s)  The format for your next appointment:   In Person  Provider:   Kirk Ruths, MD

## 2022-11-27 ENCOUNTER — Encounter: Payer: Self-pay | Admitting: Pharmacist Clinician (PhC)/ Clinical Pharmacy Specialist

## 2022-11-27 ENCOUNTER — Ambulatory Visit
Payer: BC Managed Care – PPO | Attending: Cardiovascular Disease | Admitting: Pharmacist Clinician (PhC)/ Clinical Pharmacy Specialist

## 2022-11-27 DIAGNOSIS — E7849 Other hyperlipidemia: Secondary | ICD-10-CM

## 2022-11-27 DIAGNOSIS — Z1389 Encounter for screening for other disorder: Secondary | ICD-10-CM | POA: Diagnosis not present

## 2022-11-27 MED ORDER — NEXLETOL 180 MG PO TABS: 180.0000 mg | ORAL_TABLET | Freq: Every day | ORAL | 0 refills | Status: DC

## 2022-11-27 NOTE — Assessment & Plan Note (Signed)
Assessment: Patient with ASCVD not at LDL goal of < 55 Most recent LDL 72 on 10/21/2022 Has been compliant with high intensity statin/ezetimibe : atorvastatin 80, ezetimibe 10  Reviewed options for lowering LDL cholesterol, including PCSK-9 inhibitors, bempedoic acid and inclisiran.  Discussed mechanisms of action, dosing, side effects, potential decreases in LDL cholesterol and costs.  Also reviewed potential options for patient assistance.  Plan: Patient agreeable to starting bempedoic acid Uric acid lab in 2-3 weeks If stable labs, will have him discontinue ezetimibe and start Nexlizet 180/10 once daily Repeat labs after:  3 months Lipid Liver function

## 2022-11-27 NOTE — Progress Notes (Signed)
Office Visit    Patient Name: Austin Vincent Date of Encounter: 11/27/2022  Primary Care Provider:  Haydee Salter, MD Primary Cardiologist:  Kirk Ruths, MD  Chief Complaint    Hyperlipidemia   Significant Past Medical History   CAD First MI at 65, second at 50, 6/23 CABG x 4  HTN Noted to run lower at home than in office; adjusts metoprolol dose for hypotensive episodes  cancer Medullary thyroid cancer, treated     No Known Allergies  History of Present Illness    Austin Vincent is a 52 y.o. male patient of Dr Stanford Breed, in the office today to talk about cholesterol management.  He had his first coronary event at the age of 29 and a second at 15 before having CABG x 4 last year.  He has been compliant with high intensity statin and ezetimibe, but not able to reach his LDL goal of < 55.    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  LDL Cholesterol goal:  LDL <55  Current Medications: atorvastatin 80, ezetimibe 10   Family Hx:   mother has heart issues, father had CABG x 3 in his 58's; 2 older sisters, one with high cholesterol  1 son (natural) - has high cholesterol - adjusted with diet/exercise  Social Hx: Tobacco: quit - 2-3 years ago Alcohol:  beer 2-3 times per week    Diet:  more home cooked - growing vegetables in house (hydroponics) - juicing;     Exercise: walk up to 5 miles/day; steps 10,000 + per day    Accessory Clinical Findings   Lab Results  Component Value Date   CHOL 134 10/21/2022   HDL 42 10/21/2022   LDLCALC 72 10/21/2022   LDLDIRECT 190.7 03/24/2013   TRIG 106 10/21/2022   CHOLHDL 3.2 10/21/2022    Lab Results  Component Value Date   ALT 27 10/21/2022   AST 23 10/21/2022   ALKPHOS 79 10/21/2022   BILITOT 0.6 10/21/2022   Lab Results  Component Value Date   CREATININE 0.82 07/03/2022   BUN 12 07/03/2022   NA 139 07/03/2022   K 4.0 07/03/2022   CL 108 07/03/2022   CO2 25 07/03/2022   Lab Results  Component Value Date    HGBA1C 5.5 04/10/2022    Home Medications    Current Outpatient Medications  Medication Sig Dispense Refill   aspirin 81 MG tablet Take 81 mg by mouth every evening.     atorvastatin (LIPITOR) 80 MG tablet Take 1 tablet (80 mg total) by mouth daily. (Patient taking differently: Take 80 mg by mouth every evening.)     Bempedoic Acid (NEXLETOL) 180 MG TABS Take 180 mg by mouth daily. 21 tablet 0   clopidogrel (PLAVIX) 75 MG tablet Take 75 mg by mouth daily.     ezetimibe (ZETIA) 10 MG tablet Take 1 tablet (10 mg total) by mouth daily. (Patient taking differently: Take 10 mg by mouth every evening.) 90 tablet 3   levothyroxine (SYNTHROID) 112 MCG tablet Take 112 mcg by mouth daily before breakfast.     metoprolol tartrate (LOPRESSOR) 25 MG tablet Take 1/2 tablets (12.5 mg total) by mouth 2 (two) times daily. 60 tablet 3   No current facility-administered medications for this visit.     Assessment & Plan    Hyperlipidemia Assessment: Patient with ASCVD not at LDL goal of < 55 Most recent LDL 72 on 10/21/2022 Has been compliant with high intensity statin/ezetimibe : atorvastatin 80,  ezetimibe 10  Reviewed options for lowering LDL cholesterol, including PCSK-9 inhibitors, bempedoic acid and inclisiran.  Discussed mechanisms of action, dosing, side effects, potential decreases in LDL cholesterol and costs.  Also reviewed potential options for patient assistance.  Plan: Patient agreeable to starting bempedoic acid Uric acid lab in 2-3 weeks If stable labs, will have him discontinue ezetimibe and start Nexlizet 180/10 once daily Repeat labs after:  3 months Lipid Liver function   Tommy Medal, PharmD CPP [facility] [address] [place]  [place], [place] [address] [phone]  11/27/2022, 10:38 AM

## 2022-11-27 NOTE — Patient Instructions (Signed)
Your Results:             Your most recent labs Goal  Total Cholesterol 134 < 200  Triglycerides 106 < 150  HDL (happy/good cholesterol) 42 > 40  LDL (lousy/bad cholesterol 72 < 55   Medication changes:  Start Nexletol 180 mg once daily.  After 2-3 weeks please go to the lab to get uric acid level drawn.  Once we get this result I will reach out to you to discuss whether it is safe to continue  Lab orders:  We want to repeat labs after 2-3 months.  We will send you a lab order to remind you once we get closer to that time.     Thank you for choosing [facility]

## 2022-12-16 ENCOUNTER — Encounter: Payer: Self-pay | Admitting: Pharmacist Clinician (PhC)/ Clinical Pharmacy Specialist

## 2022-12-17 DIAGNOSIS — Z1389 Encounter for screening for other disorder: Secondary | ICD-10-CM | POA: Diagnosis not present

## 2022-12-18 LAB — URIC ACID: Uric Acid: 7.9 mg/dL (ref 3.8–8.4)

## 2022-12-23 MED ORDER — NEXLIZET 180-10 MG PO TABS: 1.0000 | ORAL_TABLET | Freq: Every day | ORAL | 3 refills | Status: DC

## 2022-12-24 ENCOUNTER — Telehealth: Payer: Self-pay | Admitting: Pharmacist Clinician (PhC)/ Clinical Pharmacy Specialist

## 2022-12-24 ENCOUNTER — Other Ambulatory Visit (HOSPITAL_COMMUNITY): Payer: Self-pay

## 2022-12-24 NOTE — Telephone Encounter (Signed)
PA for Nexlezet needed.  Started by pharmacy;  Key; [id number]  Thank you

## 2022-12-24 NOTE — Telephone Encounter (Signed)
Submitted, pending

## 2022-12-27 NOTE — Telephone Encounter (Signed)
Pharmacy Patient Advocate Encounter  Prior Authorization for NEXLIZET 180-10 TAB has been approved.    Effective dates: 12/27/22 through 12/28/23   Received notification from [facility]  that prior authorization for NEXLIZET 180-10 TAB is needed.    PA submitted on 12/24/22 Key [id number] Status is pending  Karie Soda, [place] Patient Advocate Specialist Direct Number: [phone] Fax: [phone]

## 2022-12-30 ENCOUNTER — Telehealth: Payer: Self-pay | Admitting: Pharmacist Clinician (PhC)/ Clinical Pharmacy Specialist

## 2022-12-30 ENCOUNTER — Other Ambulatory Visit: Payer: Self-pay

## 2022-12-30 MED ORDER — NEXLIZET 180-10 MG PO TABS: 1.0000 | ORAL_TABLET | Freq: Every day | ORAL | 3 refills | Status: AC

## 2022-12-30 NOTE — Addendum Note (Signed)
Addended by: Rockne Menghini on: 12/30/2022 07:58 AM   Modules accepted: Orders

## 2022-12-30 NOTE — Telephone Encounter (Signed)
Please see 2/13 encounter, patient approved

## 2022-12-30 NOTE — Telephone Encounter (Signed)
Error - disreard.  PA completed

## 2022-12-30 NOTE — Telephone Encounter (Signed)
Please start PA for Nexlezet.    thanks

## 2022-12-31 ENCOUNTER — Ambulatory Visit: Payer: BC Managed Care – PPO | Attending: Genetic Counselor | Admitting: Genetic Counselor

## 2022-12-31 DIAGNOSIS — E7849 Other hyperlipidemia: Secondary | ICD-10-CM | POA: Diagnosis not present

## 2023-01-04 ENCOUNTER — Other Ambulatory Visit: Payer: Self-pay | Admitting: Cardiology

## 2023-01-07 ENCOUNTER — Other Ambulatory Visit: Payer: Self-pay

## 2023-01-08 MED ORDER — CLOPIDOGREL BISULFATE 75 MG PO TABS: 75.0000 mg | ORAL_TABLET | Freq: Every day | ORAL | 2 refills | Status: DC

## 2023-01-10 NOTE — Progress Notes (Signed)
Post-test GC notes   Referral Reason  Austin Vincent is here today for his [place] post-test genetic consult.  This is a virtual visit and patient identity was confirmed with two unique identifiers.   Genetic Consultation Notes  Austin Vincent reports no changes to his medical history or that of his family members since we last met. Does state that he is working on losing weight as he has put on 15 lbs over the last few months. Importantly, his LDL-C numbers have normalized with his current treatment regimen.  I informed Austin Vincent that his genetic test result did not identify a pathogenic variant in the genes implicated in [place].  I reiterated that a positive yield is observed in ~63% of patients with a definite clinical diagnosis of FH.   I explained to him that there are two likely reasons for having a negative test- One possibility is that his FH is polygenic and the other possibility is that he may harbor a large gene deletion in the LDL-R gene that will not be detected by DNA sequencing. 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  Informed him that some individuals that tested with a negative FH genetic test were found to have a large number of changes in their genome, each contributing to a small increase in LDL-C that cumulatively lead to an increase in LDL-C above the diagnostic cut-off. This is attributed to the polygenic inheritance seen in some with elevated LDL-C levels.  Since there is significant history of hypercholesterolemia amongst his siblings, it is recommended that they undergo routine lipid monitoring and seek treatment as appropriate.   Please note that the patient has not been counseled in this visit on personal, cultural or ethical issues that she may face due to her heart condition.    Lattie Corns, Ph.D, [facility] Clinical Molecular Geneticist

## 2023-01-12 ENCOUNTER — Other Ambulatory Visit: Payer: Self-pay | Admitting: Family Medicine

## 2023-01-13 ENCOUNTER — Encounter: Payer: Self-pay | Admitting: Cardiology

## 2023-01-13 MED ORDER — ATORVASTATIN CALCIUM 80 MG PO TABS: 80.0000 mg | ORAL_TABLET | Freq: Every day | ORAL | 1 refills | Status: DC

## 2023-02-14 DIAGNOSIS — E890 Postprocedural hypothyroidism: Secondary | ICD-10-CM | POA: Diagnosis not present

## 2023-02-14 DIAGNOSIS — C73 Malignant neoplasm of thyroid gland: Secondary | ICD-10-CM | POA: Diagnosis not present

## 2023-02-18 NOTE — Addendum Note (Signed)
Addended by: Glennon Mac on: 02/18/2023 10:57 AM   Modules accepted: Level of Service

## 2023-02-21 DIAGNOSIS — C73 Malignant neoplasm of thyroid gland: Secondary | ICD-10-CM | POA: Diagnosis not present

## 2023-02-21 DIAGNOSIS — E890 Postprocedural hypothyroidism: Secondary | ICD-10-CM | POA: Diagnosis not present

## 2023-02-25 ENCOUNTER — Encounter: Payer: Self-pay | Admitting: Pharmacist Clinician (PhC)/ Clinical Pharmacy Specialist

## 2023-02-25 DIAGNOSIS — E7800 Pure hypercholesterolemia, unspecified: Secondary | ICD-10-CM

## 2023-02-28 DIAGNOSIS — E7800 Pure hypercholesterolemia, unspecified: Secondary | ICD-10-CM | POA: Diagnosis not present

## 2023-03-01 LAB — LIPID PANEL
Chol/HDL Ratio: 2.8 ratio (ref 0.0–5.0)
Cholesterol, Total: 115 mg/dL (ref 100–199)
HDL: 41 mg/dL (ref >39)
LDL Chol Calc (NIH): 59 mg/dL (ref 0–99)
Triglycerides: 70 mg/dL (ref 0–149)
VLDL Cholesterol Cal: 15 mg/dL (ref 5–40)

## 2023-03-06 ENCOUNTER — Other Ambulatory Visit: Payer: Self-pay | Admitting: Physician Assistant

## 2023-03-26 ENCOUNTER — Other Ambulatory Visit: Payer: Self-pay | Admitting: Cardiology

## 2023-04-03 ENCOUNTER — Other Ambulatory Visit: Payer: Self-pay | Admitting: Internal Medicine

## 2023-04-03 DIAGNOSIS — E890 Postprocedural hypothyroidism: Secondary | ICD-10-CM

## 2023-04-03 DIAGNOSIS — C73 Malignant neoplasm of thyroid gland: Secondary | ICD-10-CM

## 2023-04-16 DIAGNOSIS — E890 Postprocedural hypothyroidism: Secondary | ICD-10-CM | POA: Diagnosis not present

## 2023-04-16 DIAGNOSIS — C73 Malignant neoplasm of thyroid gland: Secondary | ICD-10-CM | POA: Diagnosis not present

## 2023-04-18 DIAGNOSIS — E890 Postprocedural hypothyroidism: Secondary | ICD-10-CM | POA: Diagnosis not present

## 2023-05-02 ENCOUNTER — Ambulatory Visit
Admission: RE | Admit: 2023-05-02 | Discharge: 2023-05-02 | Disposition: A | Discharge status: Home / Self Care | Payer: BC Managed Care – PPO | Source: Ambulatory Visit | Attending: Internal Medicine | Admitting: Internal Medicine

## 2023-05-02 DIAGNOSIS — Z9089 Acquired absence of other organs: Secondary | ICD-10-CM | POA: Diagnosis not present

## 2023-05-02 DIAGNOSIS — C73 Malignant neoplasm of thyroid gland: Secondary | ICD-10-CM | POA: Diagnosis not present

## 2023-05-02 DIAGNOSIS — E890 Postprocedural hypothyroidism: Secondary | ICD-10-CM

## 2023-06-20 DIAGNOSIS — E890 Postprocedural hypothyroidism: Secondary | ICD-10-CM | POA: Diagnosis not present

## 2023-06-24 ENCOUNTER — Other Ambulatory Visit: Payer: Self-pay | Admitting: Cardiology

## 2023-08-02 ENCOUNTER — Other Ambulatory Visit: Payer: Self-pay | Admitting: Physician Assistant

## 2023-08-05 ENCOUNTER — Telehealth: Payer: Self-pay | Admitting: Cardiology

## 2023-08-05 NOTE — Telephone Encounter (Signed)
 *  STAT* If patient is at the pharmacy, call can be transferred to refill team.   1. Which medications need to be refilled? (please list name of each medication and dose if known)   metoprolol tartrate (LOPRESSOR) 25 MG tablet   2. Would you like to learn more about the convenience, safety, & potential cost savings by using the [facility]?     3. Are you open to using the [facility] (Type [facility].   4. Which pharmacy/location (including street and city if local pharmacy) is medication to be sent to?  [id number] - [place], [place] - [address]    5. Do they need a 30 day or 90 day supply?  30

## 2023-08-07 ENCOUNTER — Telehealth: Payer: Self-pay | Admitting: Cardiology

## 2023-08-07 NOTE — Telephone Encounter (Signed)
  Per MyChart scheduling message:  One more question. Have I ever had my Lp(a) levels tested?

## 2023-08-07 NOTE — Telephone Encounter (Signed)
Sent via My Chart , he had this done May of 2023 with a hospitalization

## 2023-08-22 DIAGNOSIS — E890 Postprocedural hypothyroidism: Secondary | ICD-10-CM | POA: Diagnosis not present

## 2023-08-22 DIAGNOSIS — E7800 Pure hypercholesterolemia, unspecified: Secondary | ICD-10-CM | POA: Diagnosis not present

## 2023-08-22 LAB — HEPATIC FUNCTION PANEL
ALT: 29 [IU]/L (ref 0–44)
AST: 35 [IU]/L (ref 0–40)
Albumin: 4.5 g/dL (ref 3.8–4.9)
Alkaline Phosphatase: 55 [IU]/L (ref 44–121)
Bilirubin Total: 0.5 mg/dL (ref 0.0–1.2)
Bilirubin, Direct: 0.17 mg/dL (ref 0.00–0.40)
Total Protein: 6.5 g/dL (ref 6.0–8.5)

## 2023-09-16 IMAGING — CT CT CHEST LUNG CANCER SCREENING LOW DOSE W/O CM
2 of 5 series · 15 of 40 positions shown, 18 images · non-contrast
Comparison: None.

CLINICAL DATA: Former smoker, quit 2 years ago, 35 pack-year
history.



[Series 4: lung 1.00 br44 cor · coronal · 0.64mm/px · 3 of 282 slices shown]
[im 57/282  lung]
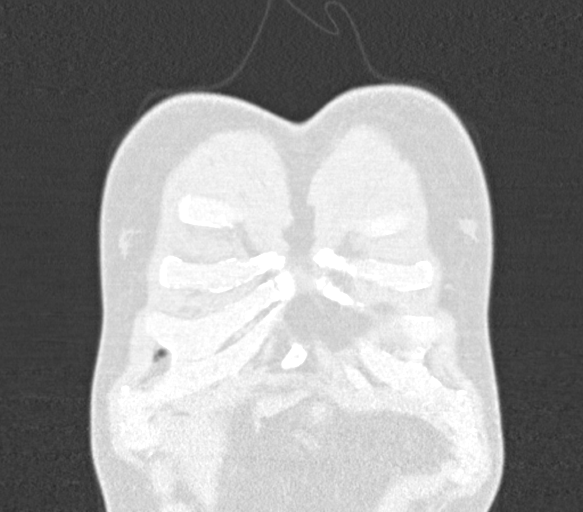
[im 113/282  lung]
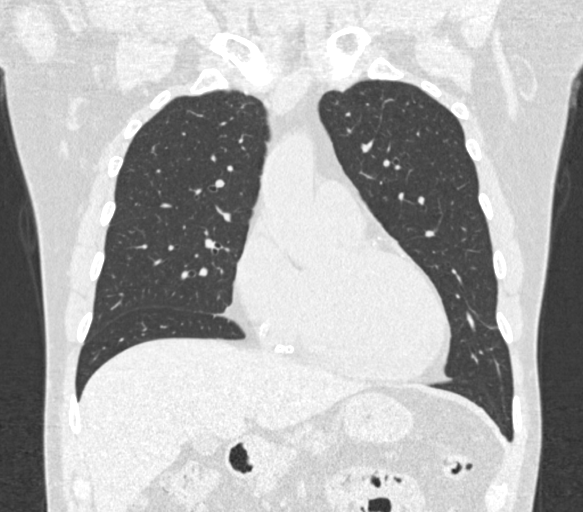
[im 169/282  lung]
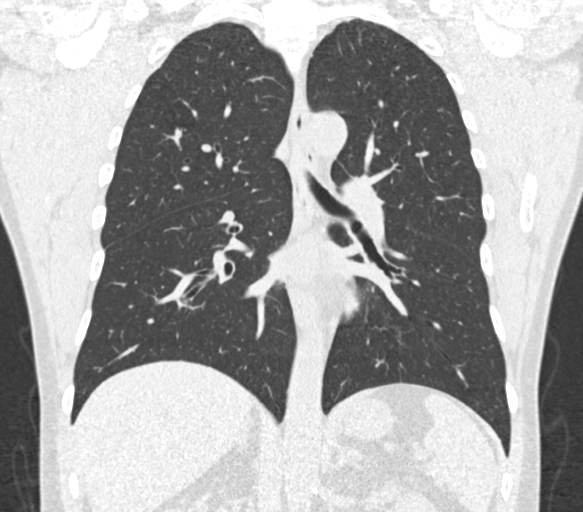

[Series 9: lung 1.00 br60 axial · axial · 0.66mm/px · z∈[-1415,-1120]mm · 12 of 325 slices shown, 15 images]
[im 15/325  mediastinal]
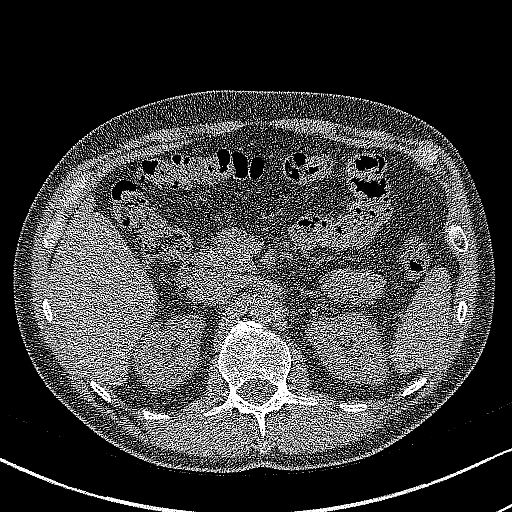
[im 15/325  lung]
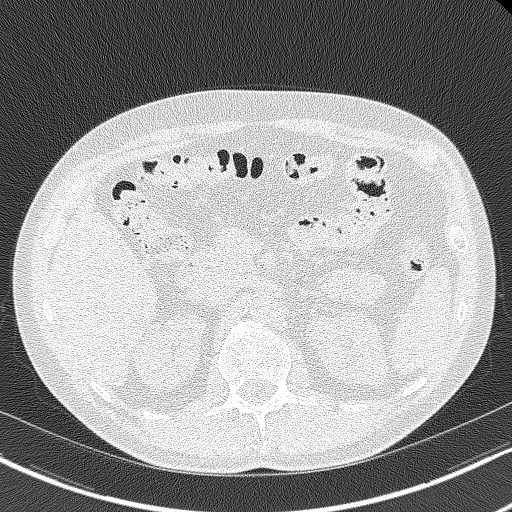
[im 45/325  lung]
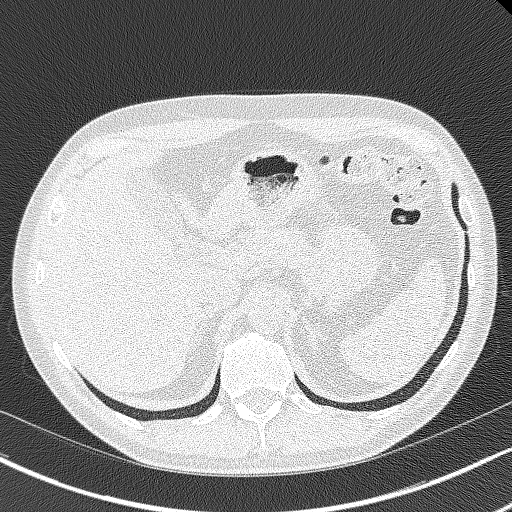
[im 74/325  lung]
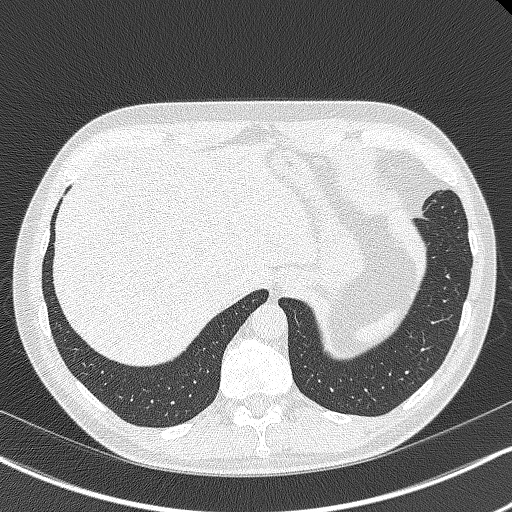
[im 104/325  lung]
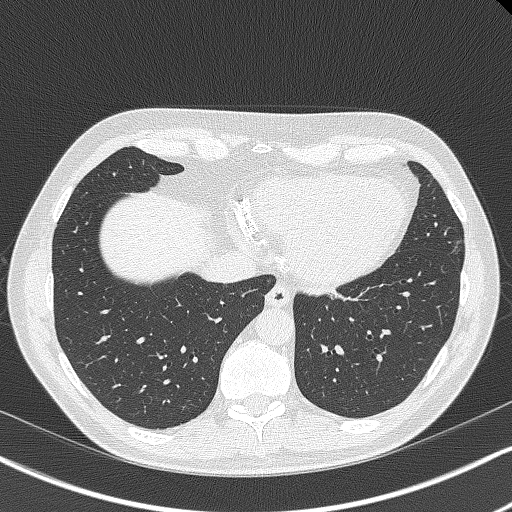
[im 118/325  mediastinal]
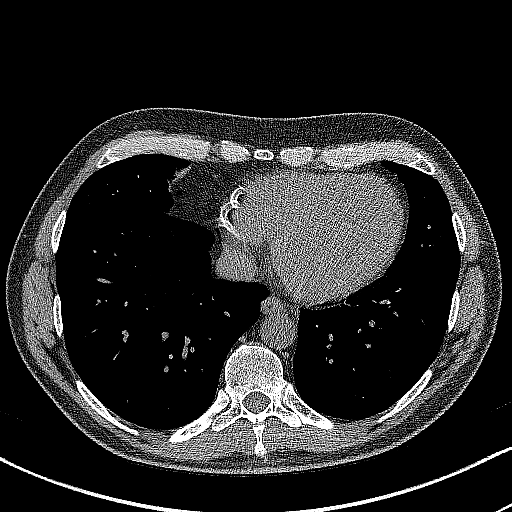
[im 118/325  lung]
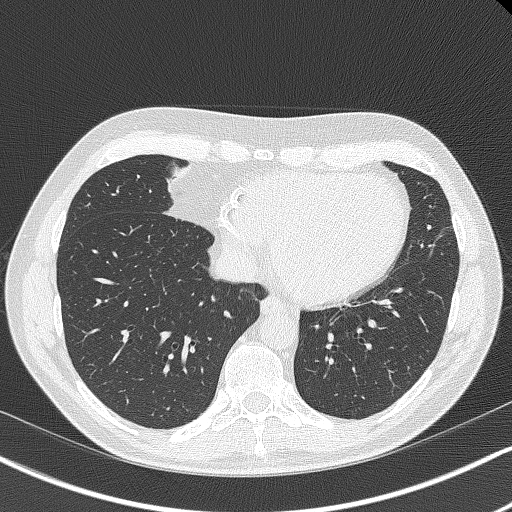
[im 148/325  lung]
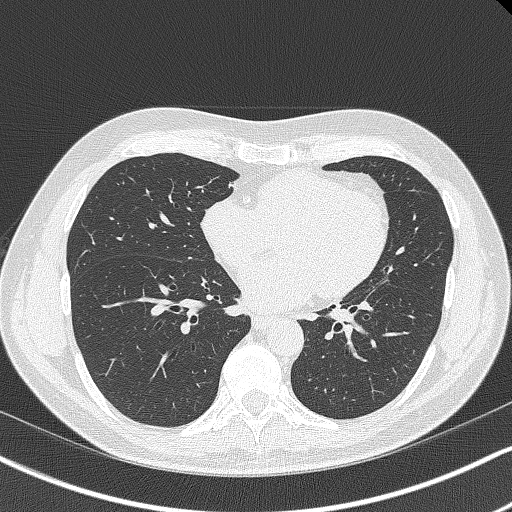
[im 177/325  lung]
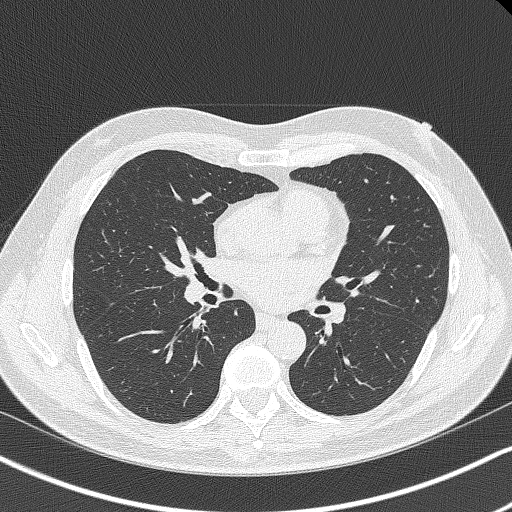
[im 207/325  lung]
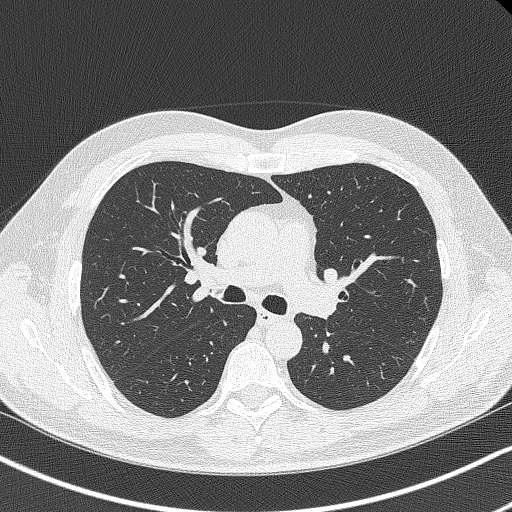
[im 221/325  mediastinal]
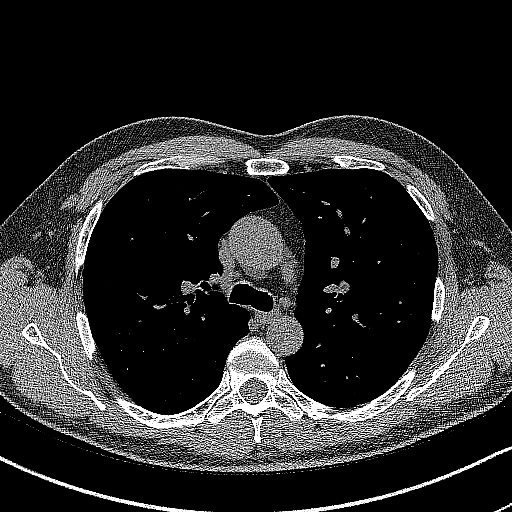
[im 221/325  lung]
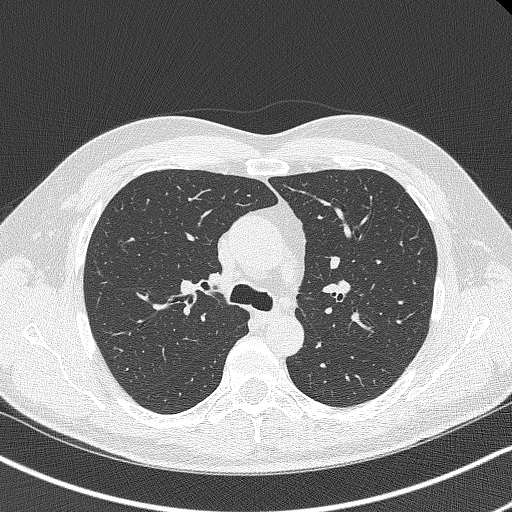
[im 251/325  lung]
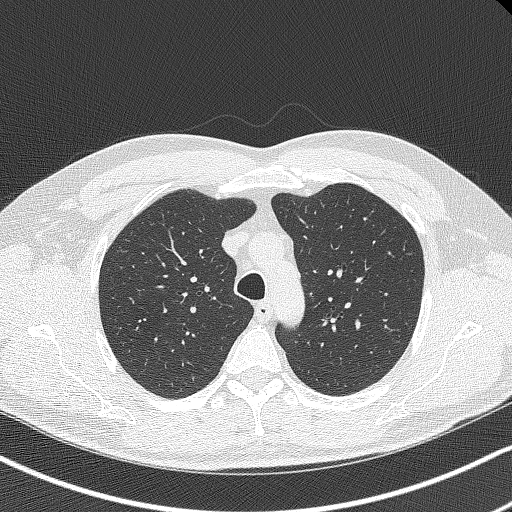
[im 280/325  lung]
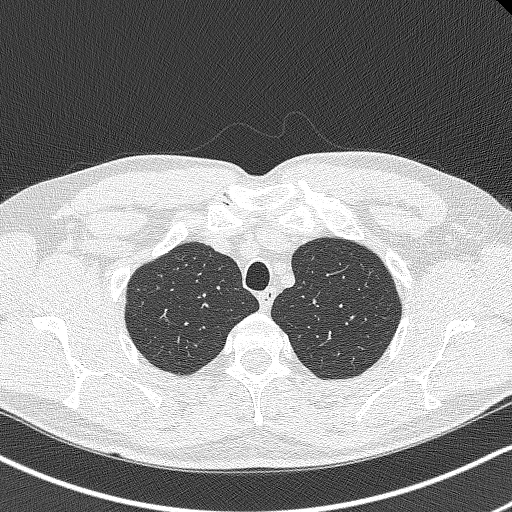
[im 310/325  lung]
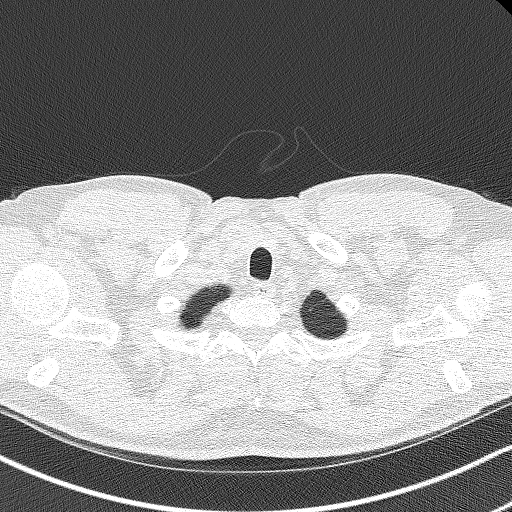

[15 of 40 positions shown; findings below may reference images not displayed]

FINDINGS: Cardiovascular: Coronary artery calcification. Heart size normal. No
pericardial effusion.

Mediastinum/Nodes: Partially imaged low-attenuation lesion in the
right thyroid measures at least 2.2 cm. No pathologically enlarged
mediastinal or axillary lymph nodes. Hilar regions are difficult to
definitively evaluate without IV contrast. Esophagus is grossly
unremarkable.

Lungs/Pleura: Centrilobular and paraseptal emphysema. 1.9 mm apical
right upper lobe nodule. No suspicious pulmonary nodules. No pleural
fluid. Airway is unremarkable.

Upper Abdomen: Visualized portions of the liver, gallbladder,
adrenal glands, kidneys, spleen pancreas, stomach and bowel are
grossly unremarkable.

Musculoskeletal: No worrisome lytic or sclerotic lesions.
Degenerative changes in the spine. Mild compression of the superior
endplate of T11, likely old.
IMPRESSION: 1. Lung-RADS 2, benign appearance or behavior. Continue annual
screening with low-dose chest CT without contrast in 12 months.
2. Partially imaged low-attenuation right thyroid nodule, least
cm. Recommend thyroid ultrasound. (Ref: [HOSPITAL]. 1847
Coronary artery calcification.
3.  Emphysema ([id number]).

## 2023-09-22 ENCOUNTER — Other Ambulatory Visit: Payer: Self-pay | Admitting: Cardiology

## 2023-09-23 IMAGING — US US THYROID
1 series · 13 of 25 positions shown · non-contrast
Comparison: Chest CT 03/23/2022

CLINICAL DATA: Right thyroid nodule on Chest CT

EXAM:
THYROID ULTRASOUND
TECHNIQUE: Ultrasound examination of the thyroid gland and adjacent soft
tissues was performed.

[Series 1: us thyroid · 0.07mm/px · 13 of 39 slices shown]
[im 1/39]
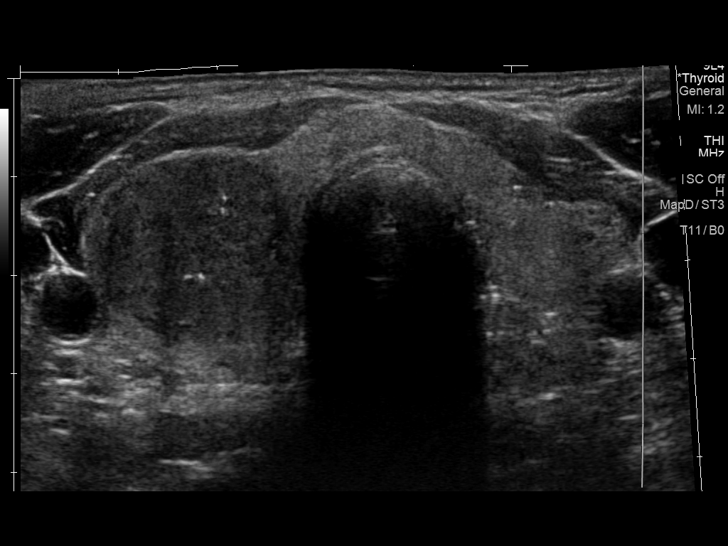
[im 4/39]
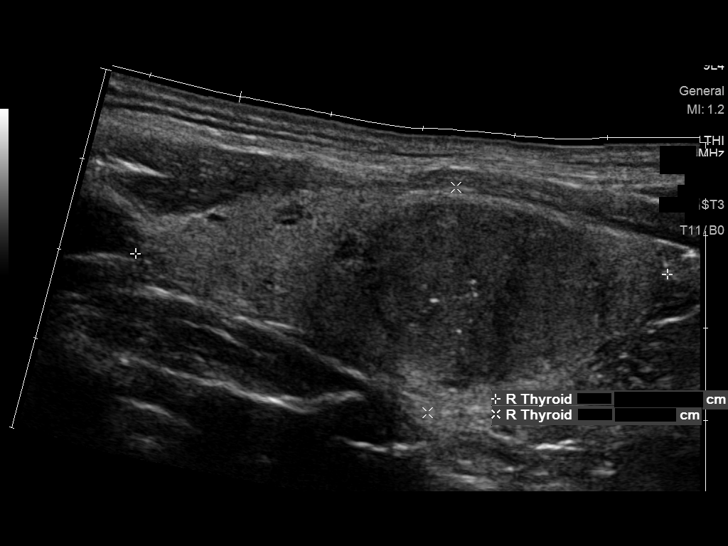
[im 7/39]
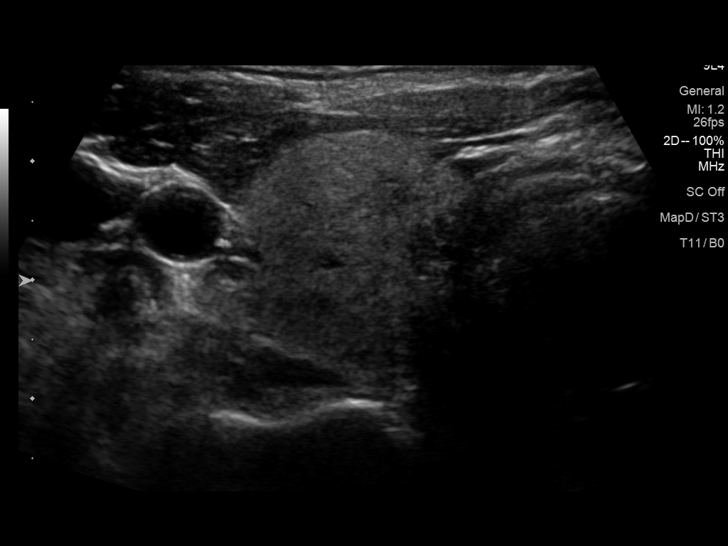
[im 10/39]
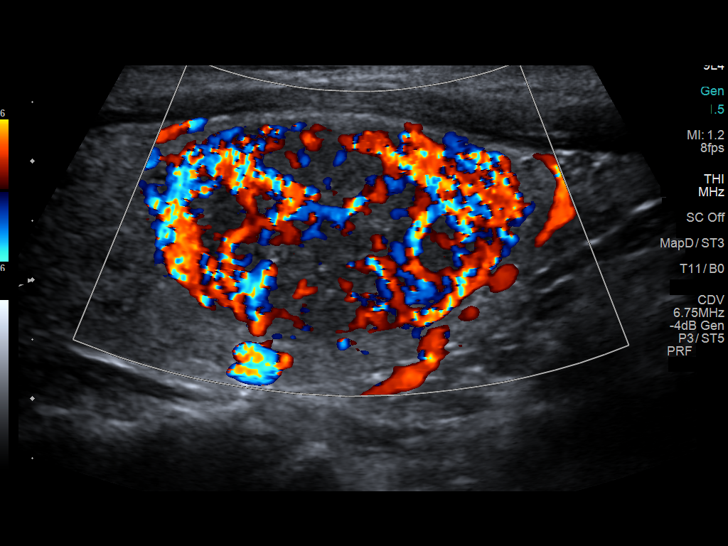
[im 13/39]
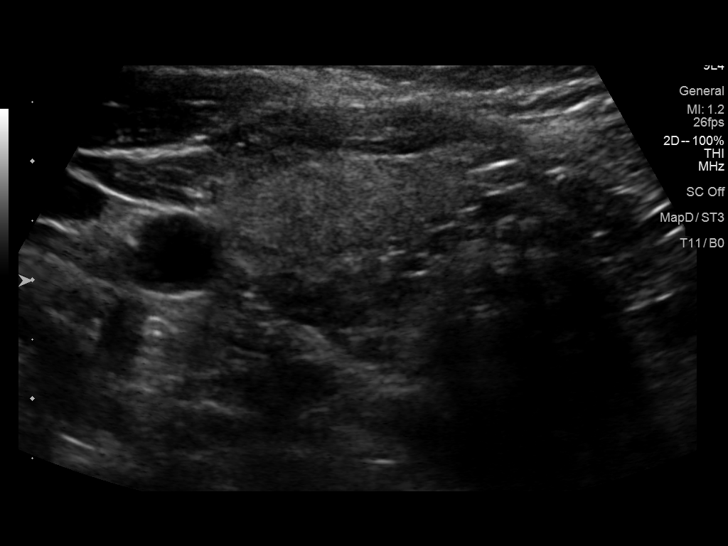
[im 16/39]
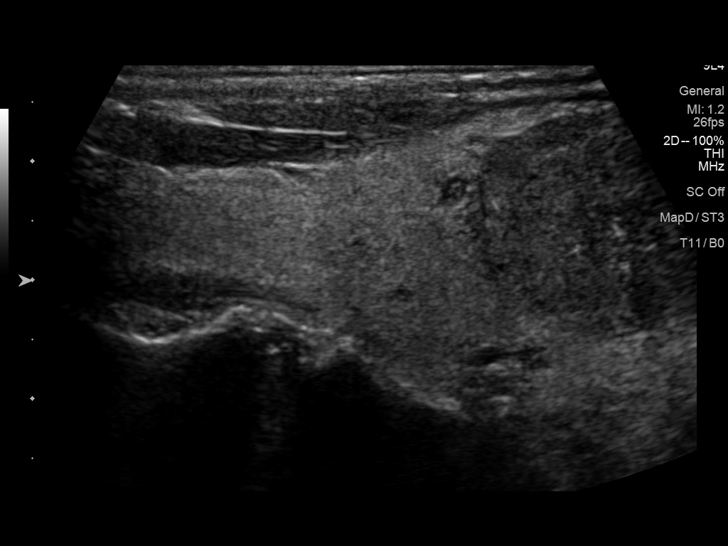
[im 20/39]
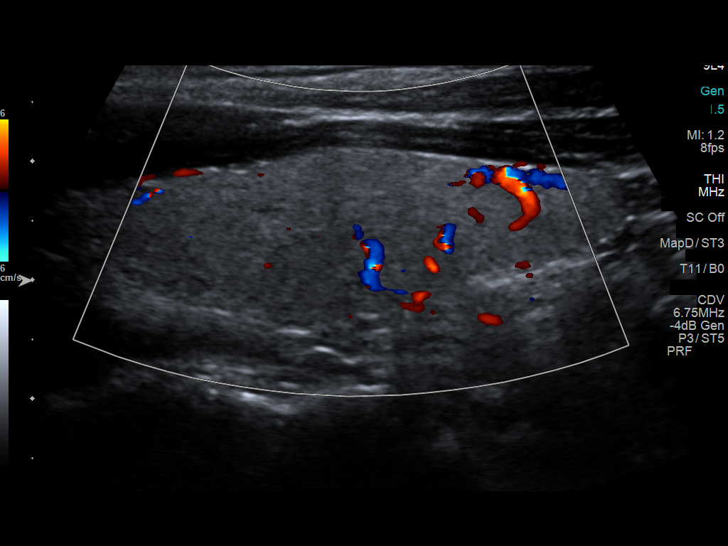
[im 23/39]
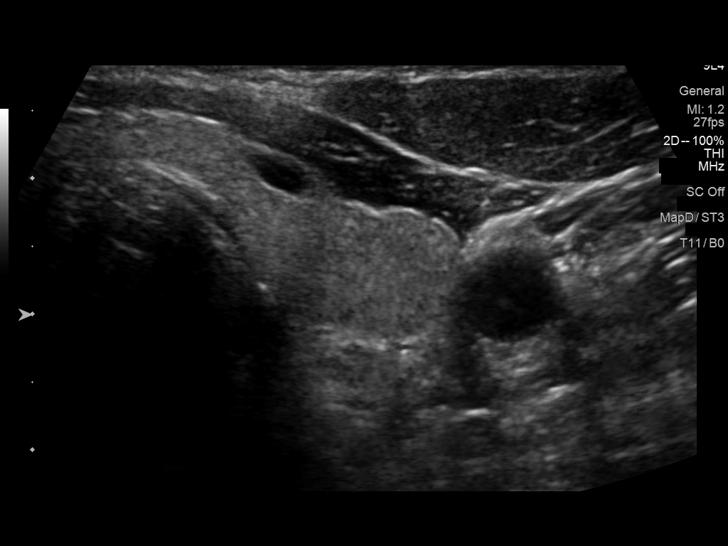
[im 26/39]
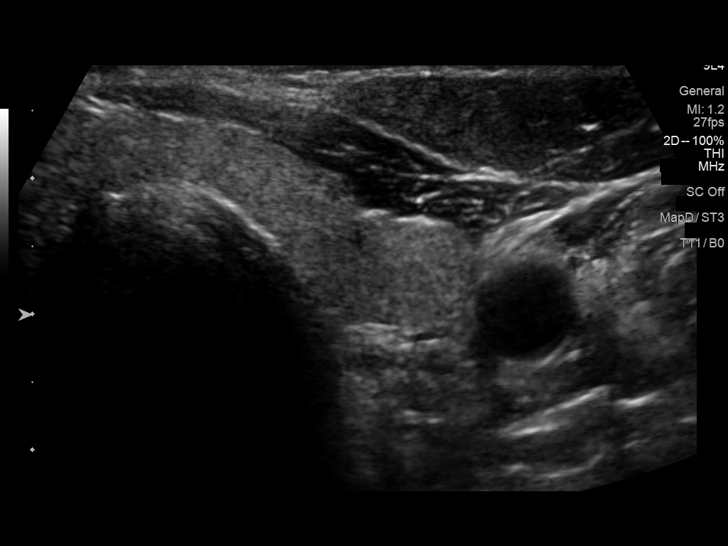
[im 29/39]
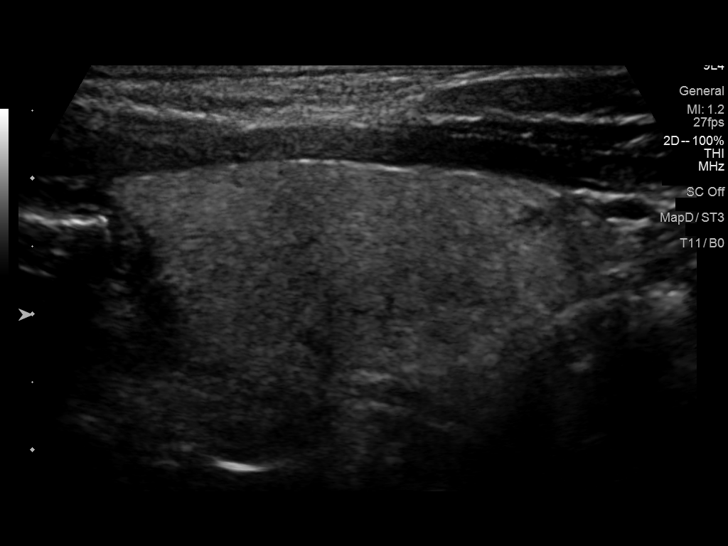
[im 32/39]
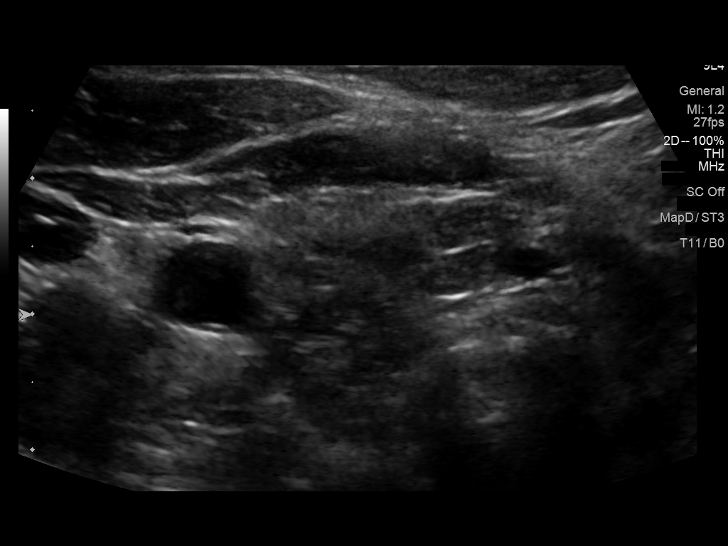
[im 35/39]
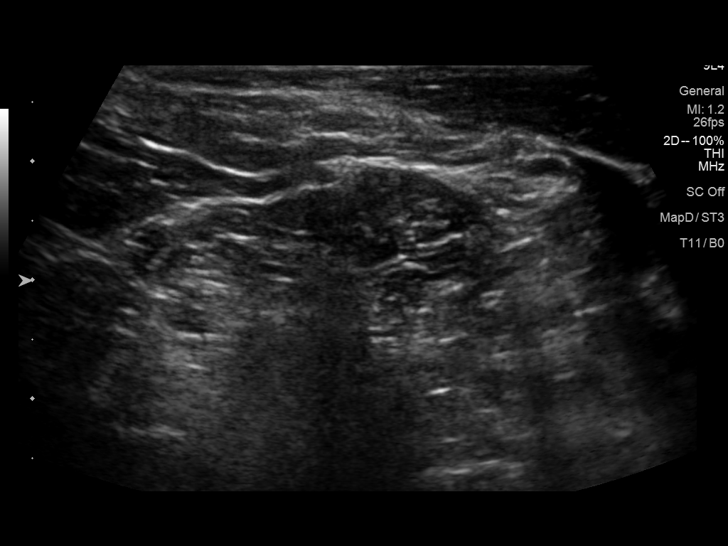
[im 39/39]
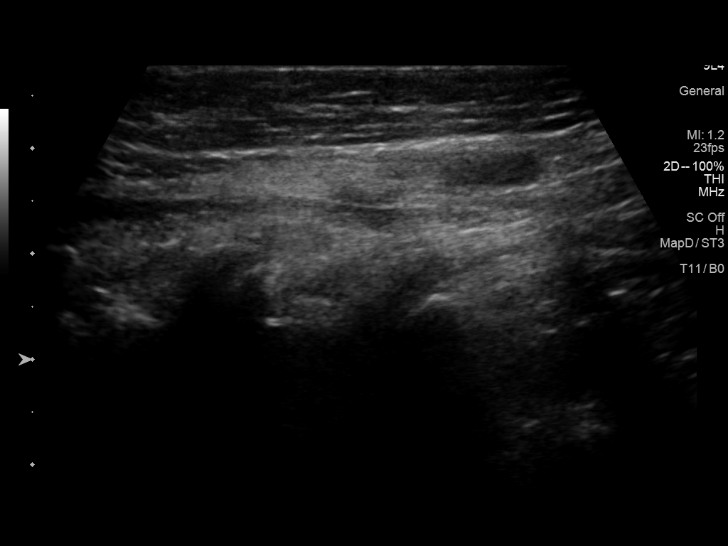

[13 of 25 positions shown; findings below may reference images not displayed]

FINDINGS: Parenchymal Echotexture: Mildly heterogenous

Isthmus: 0.5 cm

Right lobe: 6.7 x 2.5 x 2.4 cm

Left lobe: 4.8 x 1.4 x 1.6 cm

_________________________________________________________

Estimated total number of nodules >/= 1 cm: 1

Number of spongiform nodules >/=  2 cm not described below (TR1): 0

Number of mixed cystic and solid nodules >/= 1.5 cm not described
below (TR2): 0

_________________________________________________________

Nodule # 1:

Location: Right; mid

Maximum size: 2.9 cm; Other 2 dimensions: 2.3 x 2.0 cm

Composition: solid/almost completely solid (2)

Echogenicity: hypoechoic (2)

Shape: not taller-than-wide (0)

Margins: smooth (0)

Echogenic foci: punctate echogenic foci (3)

ACR TI-RADS total points: 7.

ACR TI-RADS risk category: TR5 (>/= 7 points).

ACR TI-RADS recommendations:

**Given size (>/= 1.0 cm) and appearance, fine needle aspiration of
this highly suspicious nodule should be considered based on TI-RADS
criteria.

_________________________________________________________

5 mm cystic nodule in the mid left thyroid lobe does not meet
criteria for imaging surveillance or FNA.
IMPRESSION: Nodule 1 (TI-RADS 5), measuring 2.9 cm, located in the mid right
thyroid lobe, meets criteria for FNA.

The above is in keeping with the ACR TI-RADS recommendations - [HOSPITAL] 2909;[DATE].

## 2023-09-24 MED ORDER — ATORVASTATIN CALCIUM 80 MG PO TABS: 80.0000 mg | ORAL_TABLET | Freq: Every day | ORAL | 0 refills | Status: DC

## 2023-09-24 NOTE — Addendum Note (Signed)
Addended by: Adriana Simas, Shatoya Roets L on: 09/24/2023 11:04 AM   Modules accepted: Orders

## 2023-10-23 ENCOUNTER — Encounter: Payer: Self-pay | Admitting: Cardiology

## 2023-10-23 DIAGNOSIS — E7800 Pure hypercholesterolemia, unspecified: Secondary | ICD-10-CM

## 2023-10-23 DIAGNOSIS — I2510 Atherosclerotic heart disease of native coronary artery without angina pectoris: Secondary | ICD-10-CM

## 2023-10-24 NOTE — Progress Notes (Deleted)
  Cardiology Office Note:  .   Date:  10/24/2023  ID:  Austin Vincent, DOB 09/19/1971, MRN [id number] PCP: Loyola Mast, MD  [facility] Providers Cardiologist:Brian Jens Som, MD {   }   History of Present Illness: .   Austin Vincent is a 52 y.o. male history of medullary thyroid cancer status post total thyroidectomy, CAD with severe three-vessel coronary artery disease and EF of 5055%, CABG in 2023 with LIMA to LAD, SVG to PDA, left radial artery to free graft to the diagonal and saphenous vein graft to the ramus intermediate.  Patient was last seen by Dr. Jens Som on 10/29/2022 and is here for annual follow-up.  ROS: ***  Studies Reviewed: .        *** EKG Interpretation Date/Time:    Ventricular Rate:    PR Interval:    QRS Duration:    QT Interval:    QTC Calculation:   R Axis:      Text Interpretation:      Physical Exam:   VS:  There were no vitals taken for this visit.   Wt Readings from Last 3 Encounters:  10/29/22 175 lb (79.4 kg)  07/09/22 165 lb 1.6 oz (74.9 kg)  07/03/22 157 lb (71.2 kg)    GEN: Well nourished, well developed in no acute distress NECK: No JVD; No carotid bruits CARDIAC: ***RRR, no murmurs, rubs, gallops RESPIRATORY:  Clear to auscultation without rales, wheezing or rhonchi  ABDOMEN: Soft, non-tender, non-distended EXTREMITIES:  No edema; No deformity   ASSESSMENT AND PLAN: .   ***    {Are you ordering a CV Procedure (e.g. stress test, cath, DCCV, TEE, etc)?   Press F2        :[id number]}    Signed, Bettey Mare. Liborio Nixon, ANP, AACC

## 2023-10-29 DIAGNOSIS — E7800 Pure hypercholesterolemia, unspecified: Secondary | ICD-10-CM | POA: Diagnosis not present

## 2023-10-29 DIAGNOSIS — I2510 Atherosclerotic heart disease of native coronary artery without angina pectoris: Secondary | ICD-10-CM | POA: Diagnosis not present

## 2023-10-29 LAB — LIPID PANEL
Chol/HDL Ratio: 2.5 {ratio} (ref 0.0–5.0)
Cholesterol, Total: 117 mg/dL (ref 100–199)
HDL: 46 mg/dL (ref >39)
LDL Chol Calc (NIH): 58 mg/dL (ref 0–99)
Triglycerides: 56 mg/dL (ref 0–149)
VLDL Cholesterol Cal: 13 mg/dL (ref 5–40)

## 2023-10-30 ENCOUNTER — Ambulatory Visit: Payer: BC Managed Care – PPO | Admitting: Adult Health

## 2023-10-31 ENCOUNTER — Telehealth: Payer: Self-pay

## 2023-10-31 NOTE — Telephone Encounter (Addendum)
Called patient regarding results. Patient had understanding of results.----- Message from Joni Reining sent at 10/30/2023 10:53 AM EST ----- I have reviewed the labs.  Excellent control of cholesterol.  Continue current medications.   KL

## 2023-11-13 NOTE — Progress Notes (Signed)
Cardiology Office Note:    Date:  11/27/2023   ID:  Austin Vincent, DOB May 03, 1971, MRN [id number]  PCP:  Loyola Mast, MD   [facility] Providers Cardiologist:  Olga Millers, MD {   Referring MD: Loyola Mast, MD   Chief Complaint  Patient presents with   Follow-up    CABG    History of Present Illness:    Austin Vincent is a 53 y.o. 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  Patient was last seen by Dr. Jens Som on 10/29/2022 and is here for annual follow-up.    He has been exercising and has increased his fiber intake with dietary changes and supplements. He inquires about his LP(a) due it being elevated and facebook ads. We discuss different options. He is interested in clinical trial. He stopped taking BB because BP not elevated, discussed continuing BB for hx of CABG. Will transition to torpol.    Past Medical History:  Diagnosis Date   CAD (coronary artery disease)    a. s/p MI @ age 16 with PCI of the RCA;  b. CTO of the LAD;  c. 11/2013 NSTEMI/PCI: LM nl, LAD 100 L->L collats, LCX 50p, OM2 80p, OM1 60ost, RCA 100p (3.5x38 Promus DES), RPL 99/24m PTCA only, EF 40-50%;  c. 11/2013 Echo: EF 50-55%, Gr1 DD, mild MR.   Cancer [facility]) 2023   thyroid   Family history of breast cancer    Family history of prostate cancer    HYPERLIPIDEMIA    HYPERTENSION    Kidney stone 11/2021   MITRAL REGURGITATION    Myocardial infarction [facility])    at age [age]   Tobacco abuse     Past Surgical History:  Procedure Laterality Date   COLONOSCOPY  02/12/2022   CORONARY ARTERY BYPASS GRAFT N/A 04/12/2022   Procedure: CORONARY ARTERY BYPASS GRAFTING (CABG) X FOUR BYPASSES USING OPEN LEFT INTERNAL MAMMARY ARTERY,  OPEN LEFT RADIAL ARTERY, AND ENDOSOCPIC RIGHT AND LEFT GREATER SAPHENOUS VEIN HARVEST.;  Surgeon: Lovett Sox, MD;  Location: [facility] OR;  Service: Open Heart Surgery;  Laterality: N/A;   CORONARY STENT PLACEMENT     LEFT HEART CATH AND CORONARY ANGIOGRAPHY N/A 04/09/2022   Procedure: LEFT HEART CATH AND CORONARY ANGIOGRAPHY;  Surgeon: Swazil[place], Peter M, MD;  Location: [facility] INVASIVE CV LAB;  Service: Cardiovascular;  Laterality: N/A;   LEFT HEART CATHETERIZATION WITH CORONARY ANGIOGRAM N/A 12/01/2013   Procedure: LEFT HEART CATHETERIZATION WITH CORONARY ANGIOGRAM;  Surgeon: Marykay Lex, MD;  Location: [facility] CATH LAB;  Service: Cardiovascular;  Laterality: N/A;   LITHOTRIPSY  2003   RADIAL ARTERY HARVEST Left 04/12/2022   Procedure: RADIAL ARTERY HARVEST;  Surgeon: Lovett Sox, MD;  Location: [facility] OR;  Service: Open Heart Surgery;  Laterality: Left;   TEE WITHOUT CARDIOVERSION N/A 04/12/2022   Procedure: TRANSESOPHAGEAL ECHOCARDIOGRAM (TEE);  Surgeon: Lovett Sox, MD;  Location: [facility] OR;  Service: Open Heart Surgery;  Laterality: N/A;   THYROIDECTOMY N/A 07/03/2022   Procedure: TOTAL THYROIDECTOMY;  Surgeon: Serena Colonel, MD;  Location: [facility] OR;  Service: ENT;  Laterality: N/A;    Current Medications: Current Meds  Medication Sig   aspirin 81 MG tablet Take 81 mg by mouth every evening.  Bempedoic Acid-Ezetimibe (NEXLIZET) 180-10 MG TABS Take 1 tablet by mouth daily.   metoprolol succinate (TOPROL XL) 25 MG 24 hr tablet Take 0.5 tablets (12.5 mg total) by mouth daily.   sildenafil (VIAGRA) 50 MG tablet Take 1 tablet (50 mg total) by mouth daily as needed for erectile dysfunction.   [DISCONTINUED] atorvastatin (LIPITOR) 80 MG tablet Take 1 tablet (80 mg total) by mouth daily.   [DISCONTINUED] clopidogrel (PLAVIX) 75 MG tablet Take 1 tablet (75 mg total) by mouth daily.   [DISCONTINUED] levothyroxine (SYNTHROID) 75 MCG tablet Take 75 mcg by mouth daily before breakfast.     Allergies:   Patient has  no known allergies.   Social History   Socioeconomic History   Marital status: Divorced    Spouse name: Not on file   Number of children: 2   Years of education: Not on file   Highest education level: Bachelor's degree (e.g., BA, AB, BS)  Occupational History   Occupation: Engineer, civil (consulting)    Comment: Location manager  Tobacco Use   Smoking status: Former    Current packs/day: 0.00    Types: Cigarettes    Quit date: 2021    Years since quitting: 4.0    Passive exposure: Past (younger)   Smokeless tobacco: Former    Quit date: 04/2022   Tobacco comments:    Nicotine pouches- 10/day--no tobacco  Vaping Use   Vaping status: Former   Quit date: 11/25/2021  Substance and Sexual Activity   Alcohol use: Yes    Alcohol/week: 2.0 standard drinks of alcohol    Types: 2 Cans of beer per week    Comment: socially   Drug use: Not Currently    Types: Marijuana    Comment: used delta 8 in powder form but hasn't used since 06/25/22   Sexual activity: Yes  Other Topics Concern   Not on file  Social History Narrative   Not on file   Social Drivers of Health   Financial Resource Strain: Not on file  Food Insecurity: Not on file  Transportation Needs: Not on file  Physical Activity: Not on file  Stress: Not on file  Social Connections: Not on file     Family History: The patient's family history includes Breast cancer (age of onset: 3) in his sister; COPD in his mother; Cancer in his mother; Coronary artery disease in an other family member; Diabetes in his father; Healthy in his son and son; Heart disease in his father and mother; High Cholesterol in his father, mother, sister, and sister; Hypertension in his father, mother, and sister; Prostate cancer (age of onset: 63) in his father; Skin cancer in his father and sister. There is no history of Colon cancer, Colon polyps, Esophageal cancer, Rectal cancer, or Stomach cancer.  ROS:   Please see the history of present illness.      All other systems reviewed and are negative.  EKGs/Labs/Other Studies Reviewed:    The following studies were reviewed today:  Cardiac Studies & Procedures   CARDIAC CATHETERIZATION  CARDIAC CATHETERIZATION 04/09/2022  Narrative   Mid LAD lesion is 100% stenosed.   1st Diag lesion is 90% stenosed.   1st Mrg lesion is 50% stenosed.   Mid Cx lesion is 90% stenosed.   2nd Mrg lesion is 90% stenosed.   Ost RCA to Mid RCA lesion is 95% stenosed.   Mid RCA to Dist RCA lesion is 30% stenosed.   There is mild left ventricular systolic dysfunction.  LV end diastolic pressure is normal.   The left ventricular ejection fraction is 50-55% by visual estimate.  Severe 3 vessel obstructive CAD Mildly reduced LV function. Inferoapical HK Normal LVEDP  Plan: Needs CT surgery consult for CABG.  Findings Coronary Findings Diagnostic  Dominance: Right  Left Main Vessel was injected. Vessel is normal in caliber. Vessel is angiographically normal.  Left Anterior Descending Collaterals Dist LAD filled by collaterals from Lat 1st Diag.  Collaterals Dist LAD filled by collaterals from RPDA.  Mid LAD lesion is 100% stenosed. The lesion is chronically occluded.  First Diagonal Branch 1st Diag lesion is 90% stenosed.  Left Circumflex Mid Cx lesion is 90% stenosed.  First Obtuse Marginal Branch 1st Mrg lesion is 50% stenosed.  Second Obtuse Marginal Branch 2nd Mrg lesion is 90% stenosed.  Right Coronary Artery Ost RCA to Mid RCA lesion is 95% stenosed. Mid RCA to Dist RCA lesion is 30% stenosed. The lesion was previously treated using a drug eluting stent over 2 years ago.  Right Posterior Atrioventricular Artery Collaterals RPAV filled by collaterals from Dist Cx.  Intervention  No interventions have been documented.    ECHOCARDIOGRAM  ECHOCARDIOGRAM COMPLETE 04/07/2022  Narrative ECHOCARDIOGRAM REPORT    Patient Name:   JENNIE MCNAIR [facility] Date of Exam:  04/07/2022 Medical Rec #:  [id number]           Height:       68.0 in Accession #:    [id number]          Weight:       165.2 lb Date of Birth:  [birth year]            BSA:          1.884 m Patient Age:    51 years            BP:           102/59 mmHg Patient Gender: M                   HR:           58 bpm. Exam Location:  Inpatient  Procedure: 2D Echo, Cardiac Doppler and Color Doppler  Indications:    Syncope  History:        Patient has prior history of Echocardiogram examinations, most recent 12/01/2013. CAD and Previous Myocardial Infarction, Signs/Symptoms:Syncope; Risk Factors:Dyslipidemia, Hypertension and Current Smoker.  Sonographer:    Mikki Harbor Referring Phys: [id number] HEATHER E PEMBERTON  IMPRESSIONS   1. Can consider f/u contrast imaging to look at apex if clinically indicated. 2. Distal septal / apical hypokinesis Apex with calcified band/chords doubt thrombus. Left ventricular ejection fraction, by estimation, is 50 to 55%. The left ventricle has low normal function. The left ventricle demonstrates regional wall motion abnormalities (see scoring diagram/findings for description). Left ventricular diastolic parameters were normal. 3. Right ventricular systolic function is normal. The right ventricular size is normal. 4. Left atrial size was mildly dilated. 5. The mitral valve is abnormal. Trivial mitral valve regurgitation. No evidence of mitral stenosis. 6. The aortic valve is tricuspid. There is mild calcification of the aortic valve. Aortic valve regurgitation is not visualized. No aortic stenosis is present. 7. The inferior vena cava is normal in size with greater than 50% respiratory variability, suggesting right atrial pressure of 3 mmHg.  FINDINGS Left Ventricle: Distal septal / apical hypokinesis Apex with calcified band/chords doubt thrombus. Left ventricular ejection fraction, by estimation, is 50 to 55%. The  left ventricle has low normal function. The left  ventricle demonstrates regional wall motion abnormalities. The left ventricular internal cavity size was normal in size. There is no left ventricular hypertrophy. Left ventricular diastolic parameters were normal.  Right Ventricle: The right ventricular size is normal. No increase in right ventricular wall thickness. Right ventricular systolic function is normal.  Left Atrium: Left atrial size was mildly dilated.  Right Atrium: Right atrial size was normal in size.  Pericardium: There is no evidence of pericardial effusion.  Mitral Valve: The mitral valve is abnormal. There is mild thickening of the mitral valve leaflet(s). Trivial mitral valve regurgitation. No evidence of mitral valve stenosis. MV peak gradient, 5.4 mmHg. The mean mitral valve gradient is 2.0 mmHg.  Tricuspid Valve: The tricuspid valve is normal in structure. Tricuspid valve regurgitation is trivial. No evidence of tricuspid stenosis.  Aortic Valve: The aortic valve is tricuspid. There is mild calcification of the aortic valve. Aortic valve regurgitation is not visualized. No aortic stenosis is present. Aortic valve mean gradient measures 4.0 mmHg. Aortic valve peak gradient measures 9.0 mmHg. Aortic valve area, by VTI measures 1.88 cm.  Pulmonic Valve: The pulmonic valve was normal in structure. Pulmonic valve regurgitation is trivial. No evidence of pulmonic stenosis.  Aorta: The aortic root is normal in size and structure.  Venous: The inferior vena cava is normal in size with greater than 50% respiratory variability, suggesting right atrial pressure of 3 mmHg.  IAS/Shunts: No atrial level shunt detected by color flow Doppler.  Additional Comments: Can consider f/u contrast imaging to look at apex if clinically indicated.   LEFT VENTRICLE PLAX 2D LVIDd:         4.90 cm     Diastology LVIDs:         3.50 cm     LV e' medial:    7.51 cm/s LV PW:         1.00 cm     LV E/e' medial:  14.1 LV IVS:        1.10 cm      LV e' lateral:   12.90 cm/s LVOT diam:     1.90 cm     LV E/e' lateral: 8.2 LV SV:         66 LV SV Index:   35 LVOT Area:     2.84 cm  LV Volumes (MOD) LV vol d, MOD A2C: 76.2 ml LV vol d, MOD A4C: 74.8 ml LV vol s, MOD A2C: 31.6 ml LV vol s, MOD A4C: 34.8 ml LV SV MOD A2C:     44.6 ml LV SV MOD A4C:     74.8 ml LV SV MOD BP:      43.0 ml  RIGHT VENTRICLE RV Basal diam:  3.25 cm RV Mid diam:    2.80 cm RV S prime:     12.80 cm/s TAPSE (M-mode): 2.4 cm  LEFT ATRIUM             Index        RIGHT ATRIUM           Index LA diam:        4.10 cm 2.18 cm/m   RA Area:     17.30 cm LA Vol (A2C):   50.5 ml 26.80 ml/m  RA Volume:   43.10 ml  22.87 ml/m LA Vol (A4C):   59.3 ml 31.47 ml/m LA Biplane Vol: 57.8 ml 30.68 ml/m AORTIC VALVE  PULMONIC VALVE AV Area (Vmax):    1.97 cm     PV Vmax:       0.90 m/s AV Area (Vmean):   1.87 cm     PV Peak grad:  3.2 mmHg AV Area (VTI):     1.88 cm AV Vmax:           150.00 cm/s AV Vmean:          95.700 cm/s AV VTI:            0.352 m AV Peak Grad:      9.0 mmHg AV Mean Grad:      4.0 mmHg LVOT Vmax:         104.00 cm/s LVOT Vmean:        63.100 cm/s LVOT VTI:          0.234 m LVOT/AV VTI ratio: 0.66  AORTA Ao Root diam: 3.50 cm Ao Asc diam:  3.00 cm  MITRAL VALVE MV Area (PHT): 2.95 cm     SHUNTS MV Area VTI:   1.48 cm     Systemic VTI:  0.23 m MV Peak grad:  5.4 mmHg     Systemic Diam: 1.90 cm MV Mean grad:  2.0 mmHg MV Vmax:       1.16 m/s MV Vmean:      58.4 cm/s MV Decel Time: 257 msec MV E velocity: 106.00 cm/s MV A velocity: 82.70 cm/s MV E/A ratio:  1.28  Charlton Haws MD Electronically signed by Charlton Haws MD Signature Date/Time: 04/07/2022/10:52:24 AM    Final  TEE  ECHO INTRAOPERATIVE TEE 04/12/2022  Narrative *INTRAOPERATIVE TRANSESOPHAGEAL REPORT *    Patient Name:   GUINN WEISHAUPT [facility] Date of Exam: 04/12/2022 Medical Rec #:  [id number]           Height:       68.0  in Accession #:    [id number]          Weight:       161.8 lb Date of Birth:  1971-06-25            BSA:          1.87 m Patient Age:    51 years            BP:           145/80 mmHg Patient Gender: M                   HR:           59 bpm. Exam Location:  Inpatient  Transesophogeal exam was perform intraoperatively during surgical procedure. Patient was closely monitored under general anesthesia during the entirety of examination.  Indications:     122-I22.9 Subsequent ST elevation (STEM) and non-ST elevation (NSTEMI) myocardial infarction Performing Phys: [id number] PETER VANTRIGT Diagnosing Phys: Lewie Loron MD  Complications: No known complications during this procedure. POST-OP IMPRESSIONS _ Left Ventricle: has normal systolic function, with an ejection fraction of 55-60%. _ Right Ventricle: The right ventricle appears unchanged from pre-bypass. _ Aorta: The aorta appears unchanged from pre-bypass. _ Left Atrial Appendage: The left atrial appendage appears unchanged from pre-bypass. _ Aortic Valve: The aortic valve appears unchanged from pre-bypass. _ Mitral Valve: The mitral valve appears unchanged from pre-bypass. _ Tricuspid Valve: The tricuspid valve appears unchanged from pre-bypass. _ Pulmonic Valve: The pulmonic valve appears unchanged from pre-bypass. _ Interatrial Septum: The interatrial septum appears unchanged from pre-bypass.  PRE-OP FINDINGS Left Ventricle:  The left ventricle has mildly reduced systolic function, with an ejection fraction of 45-50%. The cavity size was normal. There is borderline left ventricular hypertrophy.   Right Ventricle: The right ventricle has normal systolic function. The cavity was normal. There is no increase in right ventricular wall thickness.  Left Atrium: Left atrial size was dilated. No left atrial/left atrial appendage thrombus was detected.  Right Atrium: Right atrial size was normal in size.  Interatrial Septum: No atrial level  shunt detected by color flow Doppler. There is no evidence of a patent foramen ovale.  Pericardium: There is no evidence of pericardial effusion.  Mitral Valve: The mitral valve is normal in structure. Mitral valve regurgitation is moderate by color flow Doppler. There is no evidence of mitral valve vegetation.  Tricuspid Valve: The tricuspid valve was normal in structure. Tricuspid valve regurgitation is mild by color flow Doppler.  Aortic Valve: The aortic valve is tricuspid Aortic valve regurgitation is trivial by color flow Doppler. There is no stenosis of the aortic valve. There is no evidence of aortic valve vegetation.   Pulmonic Valve: The pulmonic valve was normal in structure. Pulmonic valve regurgitation is trivial by color flow Doppler.   Shunts: There is no evidence of an atrial septal defect.  +-------------+--------++ AORTIC VALVE          +-------------+--------++ AV Mean Grad:4.0 mmHg +-------------+--------++   Lewie Loron MD Electronically signed by Lewie Loron MD Signature Date/Time: 04/12/2022/4:42:00 PM    Final             EKG Interpretation Date/Time:  Thursday November 27 2023 08:00:42 EST Ventricular Rate:  74 PR Interval:  148 QRS Duration:  86 QT Interval:  396 QTC Calculation: 439 R Axis:   55  Text Interpretation: Normal sinus rhythm Inferior infarct (cited on or before 07-Apr-2022) When compared with ECG of 03-Jul-2022 07:55, Vent. rate has increased BY  25 BPM ST elevation now present in Anterior leads Inverted T waves have replaced nonspecific T wave abnormality in Lateral leads Confirmed by Micah Flesher ([id number]) on 11/27/2023 8:03:24 AM    Recent Labs: 08/22/2023: ALT 29  Recent Lipid Panel    Component Value Date/Time   CHOL 117 10/29/2023 0823   TRIG 56 10/29/2023 0823   HDL 46 10/29/2023 0823   CHOLHDL 2.5 10/29/2023 0823   CHOLHDL 5 12/31/2021 0839   VLDL 24.2 12/31/2021 0839   LDLCALC 58 10/29/2023 0823    LDLDIRECT 190.7 03/24/2013 0752     Risk Assessment/Calculations:                Physical Exam:    VS:  BP 120/78   Pulse 74   Ht 5\' 9"  (1.753 m)   Wt 170 lb 9.6 oz (77.4 kg)   SpO2 99%   BMI 25.19 kg/m     Wt Readings from Last 3 Encounters:  11/27/23 170 lb 9.6 oz (77.4 kg)  10/29/22 175 lb (79.4 kg)  07/09/22 165 lb 1.6 oz (74.9 kg)     GEN:  Well nourished, well developed in no acute distress HEENT: Normal NECK: No JVD; No carotid bruits LYMPHATICS: No lymphadenopathy CARDIAC: RRR, no murmurs, rubs, gallops RESPIRATORY:  Clear to auscultation without rales, wheezing or rhonchi  ABDOMEN: Soft, non-tender, non-distended MUSCULOSKELETAL:  No edema; No deformity  SKIN: Warm and dry NEUROLOGIC:  Alert and oriented x 3 PSYCHIATRIC:  Normal affect   ASSESSMENT:    1. Essential hypertension   2. S/P CABG x 4  3. Family history of prostate cancer   4. Familial hyperlipidemia   5. Medullary thyroid carcinoma (HCC)   6. Erectile dysfunction, unspecified erectile dysfunction type    PLAN:    In order of problems listed above:   CAD s/p CABG (LIMA-LAD, SVG-PDA, left radial-diag, SVG-ramus) 2023 - remains on ASA and plavix, BB - no chest pain   Hyperlipidemia with LDL goal < 70 10/29/2023: Cholesterol, Total 117; HDL 46; LDL Chol Calc (NIH) 58; Triglycerides 56 LP(a) 183.1 - on 80 mg lipitor and nexlizet - needs pharmD referral for PCSK9i - also interested in clinical trial   ED - currently buying online - will prescribe 50 mg Viagra PRN    Hypothyroidism Reduced to 75 mcg Takes all medication at the same time - asked him to check in with PCP    Follow up in 1 year.            Medication Adjustments/Labs and Tests Ordered: Current medicines are reviewed at length with the patient today.  Concerns regarding medicines are outlined above.  Orders Placed This Encounter  Procedures   AMB Referral to [facility] Pharm-D   EKG 12-Lead   Meds  ordered this encounter  Medications   metoprolol succinate (TOPROL XL) 25 MG 24 hr tablet    Sig: Take 0.5 tablets (12.5 mg total) by mouth daily.    Dispense:  45 tablet    Refill:  3   sildenafil (VIAGRA) 50 MG tablet    Sig: Take 1 tablet (50 mg total) by mouth daily as needed for erectile dysfunction.    Dispense:  10 tablet    Refill:  6   clopidogrel (PLAVIX) 75 MG tablet    Sig: Take 1 tablet (75 mg total) by mouth daily.    Dispense:  90 tablet    Refill:  3   atorvastatin (LIPITOR) 80 MG tablet    Sig: Take 1 tablet (80 mg total) by mouth daily.    Dispense:  90 tablet    Refill:  3   levothyroxine (SYNTHROID) 75 MCG tablet    Sig: Take 1 tablet (75 mcg total) by mouth daily before breakfast.    Dispense:  90 tablet    Refill:  3    Patient Instructions  Medication Instructions:  Restart Metoprolol Succinate 12.5 mg daily Stop Metoprolol Tartrate as directed Sildenafil 50 mg daily as needed  *If you need a refill on your cardiac medications before your next appointment, please call your pharmacy*   Lab Work: NONE ordered at this time of appointment   Testing/Procedures: NONE ordered at this time of appointment   Follow-Up: At [facility], you and your health needs are our priority.  As part of our continuing mission to provide you with exceptional heart care, we have created designated Provider Care Teams.  These Care Teams include your primary Cardiologist (physician) and Advanced Practice Providers (APPs -  Physician Assistants and Nurse Practitioners) who all work together to provide you with the care you need, when you need it.  We recommend signing up for the patient portal called "MyChart".  Sign up information is provided on this After Visit Summary.  MyChart is used to connect with patients for Virtual Visits (Telemedicine).  Patients are able to view lab/test results, encounter notes, upcoming appointments, etc.  Non-urgent messages can be sent to  your provider as well.   To learn more about what you can do with MyChart, go to [email or web address].  Your next appointment:   1 year(s) Pharm-D apt Provider:   Olga Millers, MD              Signed, Roe Rutherford Carmon Sahli, PA  11/27/2023 8:49 AM    [facility]

## 2023-11-27 ENCOUNTER — Encounter: Payer: Self-pay | Admitting: Cardiology

## 2023-11-27 ENCOUNTER — Ambulatory Visit: Payer: BC Managed Care – PPO | Attending: Physician Assistant | Admitting: Physician Assistant

## 2023-11-27 ENCOUNTER — Encounter: Payer: Self-pay | Admitting: Physician Assistant

## 2023-11-27 VITALS — BP 120/78 | HR 74 | Ht 69.0 in | Wt 170.6 lb

## 2023-11-27 DIAGNOSIS — N529 Male erectile dysfunction, unspecified: Secondary | ICD-10-CM

## 2023-11-27 DIAGNOSIS — C73 Malignant neoplasm of thyroid gland: Secondary | ICD-10-CM

## 2023-11-27 DIAGNOSIS — E7849 Other hyperlipidemia: Secondary | ICD-10-CM

## 2023-11-27 DIAGNOSIS — I10 Essential (primary) hypertension: Secondary | ICD-10-CM

## 2023-11-27 DIAGNOSIS — Z951 Presence of aortocoronary bypass graft: Secondary | ICD-10-CM

## 2023-11-27 DIAGNOSIS — Z8042 Family history of malignant neoplasm of prostate: Secondary | ICD-10-CM | POA: Diagnosis not present

## 2023-11-27 MED ORDER — LEVOTHYROXINE SODIUM 75 MCG PO TABS: 75.0000 ug | ORAL_TABLET | Freq: Every day | ORAL | 3 refills | Status: AC

## 2023-11-27 MED ORDER — CLOPIDOGREL BISULFATE 75 MG PO TABS: 75.0000 mg | ORAL_TABLET | Freq: Every day | ORAL | 3 refills | Status: DC

## 2023-11-27 MED ORDER — ATORVASTATIN CALCIUM 80 MG PO TABS: 80.0000 mg | ORAL_TABLET | Freq: Every day | ORAL | 3 refills | Status: DC

## 2023-11-27 MED ORDER — SILDENAFIL CITRATE 50 MG PO TABS: 50.0000 mg | ORAL_TABLET | Freq: Every day | ORAL | 6 refills | Status: AC | PRN

## 2023-11-27 MED ORDER — METOPROLOL SUCCINATE ER 25 MG PO TB24: 12.5000 mg | ORAL_TABLET | Freq: Every day | ORAL | 3 refills | Status: AC

## 2023-11-27 NOTE — Patient Instructions (Addendum)
Medication Instructions:  Restart Metoprolol Succinate 12.5 mg daily Stop Metoprolol Tartrate as directed Sildenafil 50 mg daily as needed  *If you need a refill on your cardiac medications before your next appointment, please call your pharmacy*   Lab Work: NONE ordered at this time of appointment   Testing/Procedures: NONE ordered at this time of appointment   Follow-Up: At [facility], you and your health needs are our priority.  As part of our continuing mission to provide you with exceptional heart care, we have created designated Provider Care Teams.  These Care Teams include your primary Cardiologist (physician) and Advanced Practice Providers (APPs -  Physician Assistants and Nurse Practitioners) who all work together to provide you with the care you need, when you need it.  We recommend signing up for the patient portal called "MyChart".  Sign up information is provided on this After Visit Summary.  MyChart is used to connect with patients for Virtual Visits (Telemedicine).  Patients are able to view lab/test results, encounter notes, upcoming appointments, etc.  Non-urgent messages can be sent to your provider as well.   To learn more about what you can do with MyChart, go to [email or web address].    Your next appointment:   1 year(s) Pharm-D apt Provider:   Olga Millers, MD

## 2023-12-03 ENCOUNTER — Other Ambulatory Visit (HOSPITAL_COMMUNITY): Payer: Self-pay

## 2023-12-06 IMAGING — DX DG CHEST 1V PORT
1 series · 1 of 1 positions shown · non-contrast
Comparison: Portable chest yesterday at [DATE] a.m.

CLINICAL DATA: Evaluate chest tubes, pneumothorax post CABG.

EXAM:
PORTABLE CHEST 1 VIEW

[chest]
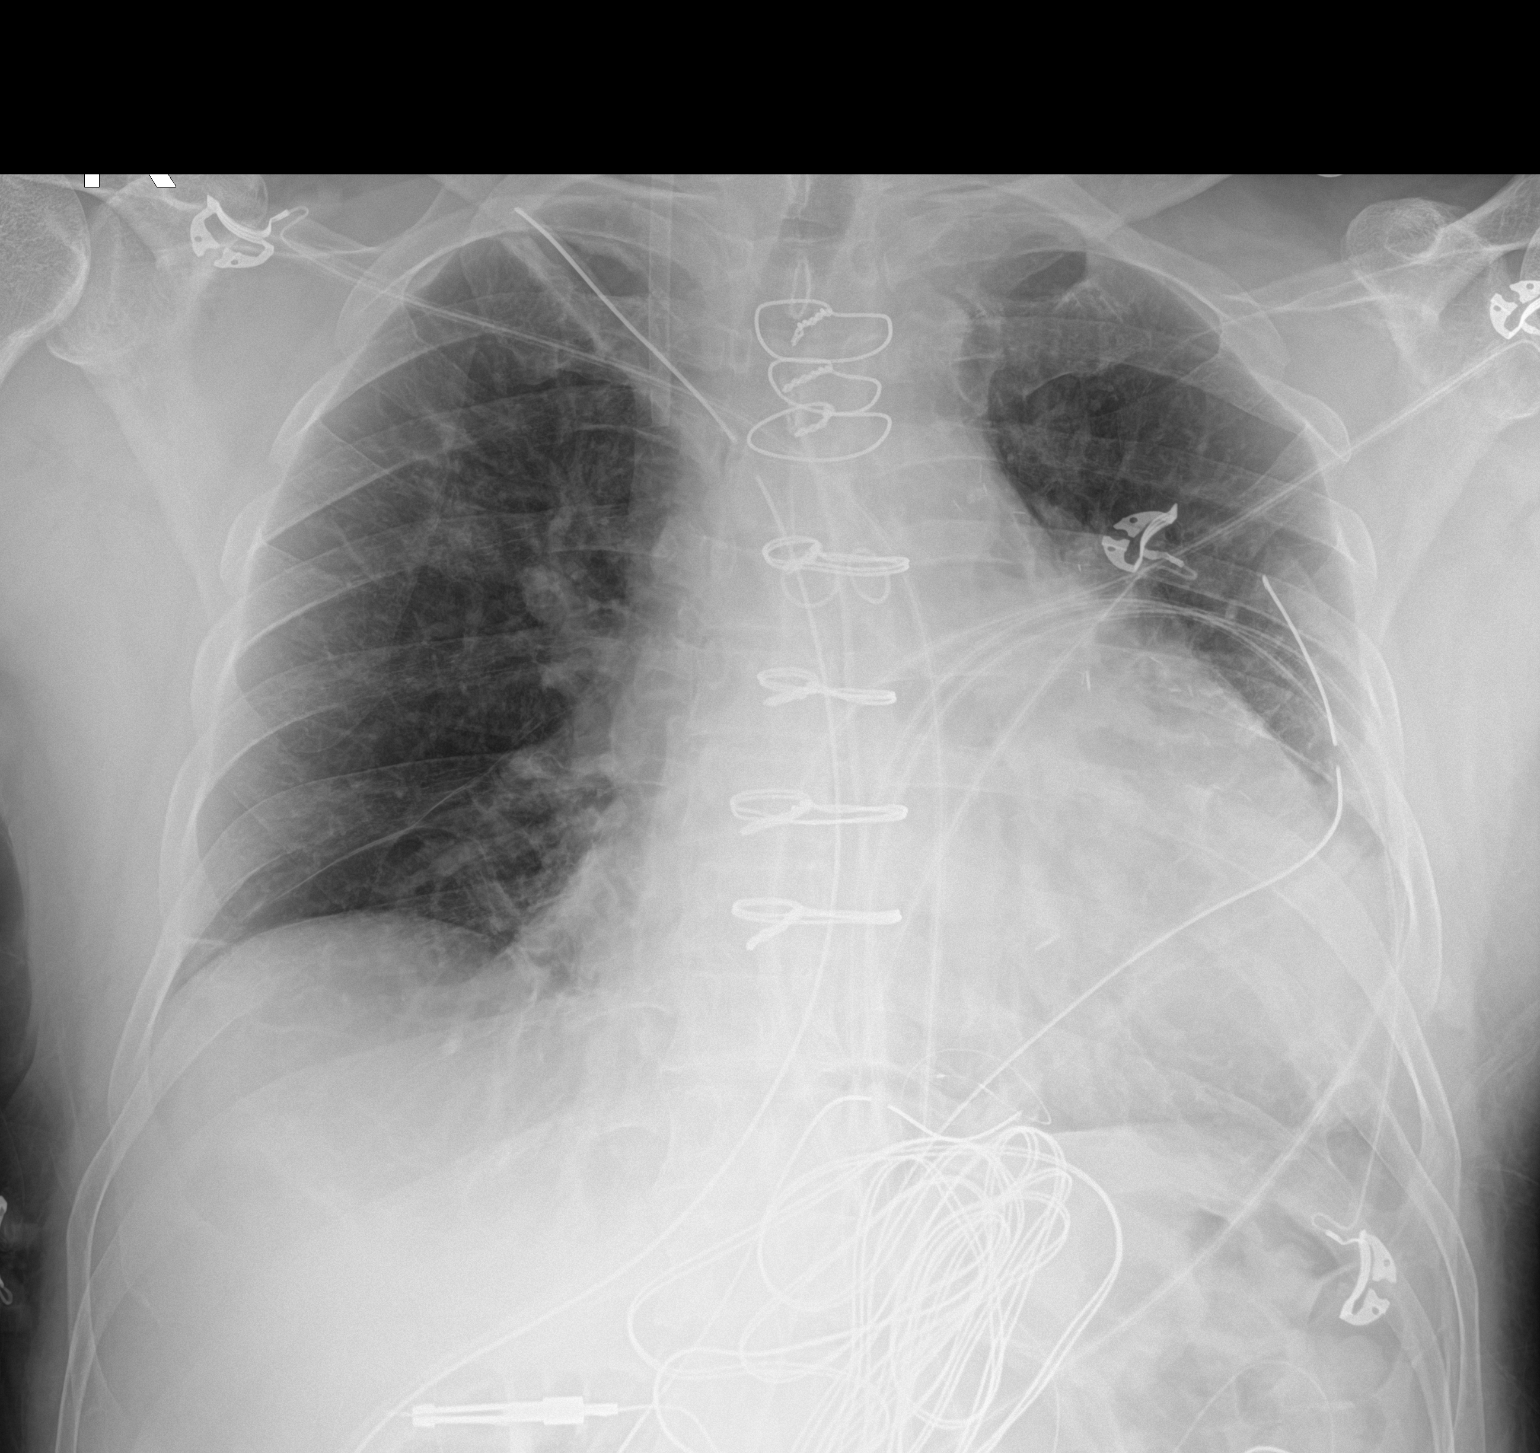

[1 of 1 positions shown; findings below may reference images not displayed]

FINDINGS: [DATE] a.m. 04/14/2022. Interval removal Swan-Ganz catheter, ETT, NGT.

Right IJ catheter introducer remains in place with tip at the
brachiocephalic/SVC junction.

At least 1, possibly 2 mediastinal drains remain in position and
bilateral chest tubes are unchanged.

Query minimal right apical pneumothorax new from the last film
yesterday. If present this would only be approximally 3% or less
volume. This is only a questionable finding.

Cardiomegaly with CABG change. Sternotomy sutures remain midline.
Central vessels are normal caliber.

There is interval clearing in the left base except for a few
atelectatic bands.

Remaining lungs clear. No significant pleural effusion. Stable
mediastinal configuration.
IMPRESSION: 1. Questionable minimal interval new right apical pneumothorax, if
present this would only be 3% or less volume.
2. No left pneumothorax.  Stable positioning of chest tubes.
3. Interval clearing in the left base except for a few atelectatic
bands.
4. Interval removal Swan-Ganz catheter, ETT, NGT.
5. These results will be called to the ordering clinician or
representative by the radiologist assistant, and communication
documented in the PACS or [REDACTED].

## 2023-12-22 ENCOUNTER — Telehealth: Payer: Self-pay

## 2023-12-22 ENCOUNTER — Other Ambulatory Visit (HOSPITAL_COMMUNITY): Payer: Self-pay

## 2023-12-22 NOTE — Telephone Encounter (Signed)
 Pharmacy Patient Advocate Encounter   Received notification from CoverMyMeds that prior authorization for NEXLIZET  is required/requested.   Insurance verification completed.   The patient is insured through [facility] .   Per test claim: The current 30 day co-pay is, $25.  No PA needed at this time. This test claim was processed through [facility]- copay amounts may vary at other pharmacies due to pharmacy/plan contracts, or as the patient moves through the different stages of their insurance plan.

## 2024-01-02 ENCOUNTER — Telehealth: Payer: Self-pay | Admitting: Pharmacy Technician

## 2024-01-02 ENCOUNTER — Telehealth: Payer: Self-pay | Admitting: Pharmacist

## 2024-01-02 ENCOUNTER — Other Ambulatory Visit (HOSPITAL_COMMUNITY): Payer: Self-pay

## 2024-01-02 ENCOUNTER — Ambulatory Visit: Payer: BC Managed Care – PPO | Attending: Cardiology | Admitting: Pharmacist

## 2024-01-02 ENCOUNTER — Encounter: Payer: Self-pay | Admitting: Pharmacist

## 2024-01-02 DIAGNOSIS — E7849 Other hyperlipidemia: Secondary | ICD-10-CM

## 2024-01-02 DIAGNOSIS — Z951 Presence of aortocoronary bypass graft: Secondary | ICD-10-CM

## 2024-01-02 DIAGNOSIS — I2510 Atherosclerotic heart disease of native coronary artery without angina pectoris: Secondary | ICD-10-CM

## 2024-01-02 DIAGNOSIS — E7841 Elevated Lipoprotein(a): Secondary | ICD-10-CM | POA: Diagnosis not present

## 2024-01-02 NOTE — Telephone Encounter (Signed)
Please complete PA for repatha/praluent

## 2024-01-02 NOTE — Patient Instructions (Signed)
It was nice meeting you today  We would like your LDL to be less than 55, and your LPA to be less than 75  Please continue your atorvastatin 80mg  once a day as well as your fiber supplementation  We can recheck your LPA today  I will submit prior authorizations for Repatha dn Leqvio so we can compare pricing  Let me know if you have any questions  Laural Golden, PharmD, BCACP, CDCES, CPP [address] [place], [place], [address] Phone: [phone], Fax: [phone]

## 2024-01-02 NOTE — Progress Notes (Signed)
Patient ID: Austin Vincent                 DOB: [birth year]                    MRN: [id number]     HPI: Austin Vincent is a 53 y.o. male patient referred to lipid clinic by Bettina Gavia PA. Patient of Dr Jens Som. PMH is significant for medullary thyroid carcinoma, HTN, CAD, NSTEMI, and history of CABG. Patient has elevated LPA.  Patient presents today to discuss lipid lowering options. Currently on atorvastatin 80mg  and Nexlizet 180-10mg  without any reported adverse effects. Also takes fiber supplmentation to help block cholesterol absorption.   Patient walks 10-15 thousands steps daily and follows a heatr healthy diet.  Had first MI at 53 years old. Had another NSTEMI in 2015 and CABG x4 in 2023.  Patient is fasting today and would like lipoprotein A rechecked,  Current Medications:  Atorvastatin 80mg   Nexlizet 180-10mg  daily  Intolerances: N/A  Risk Factors:  CAD Hx of NSTEMI Hx of CABG Elevated LPA Former smoker  LDL goal: <55  Labs: LPA 183.1 (04/10/22) TC 117, Trigs 56, HDL 46, LDL 58 (10/29/23)  Past Medical History:  Diagnosis Date   CAD (coronary artery disease)    a. s/p MI @ age 47 with PCI of the RCA;  b. CTO of the LAD;  c. 11/2013 NSTEMI/PCI: LM nl, LAD 100 L->L collats, LCX 50p, OM2 80p, OM1 60ost, RCA 100p (3.5x38 Promus DES), RPL 99/46m PTCA only, EF 40-50%;  c. 11/2013 Echo: EF 50-55%, Gr1 DD, mild MR.   Cancer [facility]) 2023   thyroid   Family history of breast cancer    Family history of prostate cancer    HYPERLIPIDEMIA    HYPERTENSION    Kidney stone 11/2021   MITRAL REGURGITATION    Myocardial infarction [facility])    at age [age]   Tobacco abuse     Current Outpatient Medications on File Prior to Visit  Medication Sig Dispense Refill   aspirin 81 MG tablet Take 81 mg by mouth every evening.     atorvastatin (LIPITOR) 80 MG tablet Take 1 tablet (80 mg total) by mouth daily. 90 tablet 3   Bempedoic Acid-Ezetimibe (NEXLIZET) 180-10 MG TABS Take 1  tablet by mouth daily. 90 tablet 3   clopidogrel (PLAVIX) 75 MG tablet Take 1 tablet (75 mg total) by mouth daily. 90 tablet 3   levothyroxine (SYNTHROID) 75 MCG tablet Take 1 tablet (75 mcg total) by mouth daily before breakfast. 90 tablet 3   metoprolol succinate (TOPROL XL) 25 MG 24 hr tablet Take 0.5 tablets (12.5 mg total) by mouth daily. 45 tablet 3   sildenafil (VIAGRA) 50 MG tablet Take 1 tablet (50 mg total) by mouth daily as needed for erectile dysfunction. 10 tablet 6   No current facility-administered medications on file prior to visit.    No Known Allergies  Assessment/Plan:  1. Hyperlipidemia - Patient last LDL 58 which is slightly above goal of <55. LPA very elevated however at 183.1 which is above goal of <75. Patient interested in options that will lower LPA. Sent message to clinical trials coordinator to see if he is a candidate. Explained other currently available options include inclisiran and PCSK9i.  Explained mechanism of action and dosing schedule of both inclisiran and PCSK9i. Used demo pen to educate patient on administration technique and possible adverse effects. Will complete PA and contact patient and will  submit start form for Leqvio. Plan to contact patient next week with results.  Continue atorvatstain 80mg  daily Continue Nexlizet 180-10mg  daily Complete PA for Repatha and Leqvio Recheck LPA  Laural Golden, PharmD, BCACP, CDCES, CPP [address], [address] [place], [place], [address] Phone: [phone], Fax: [phone]

## 2024-01-02 NOTE — Telephone Encounter (Signed)
Pharmacy Patient Advocate Encounter   Received notification from [employer] Calls Messages that prior authorization for Repatha is required/requested.   Insurance verification completed.   The patient is insured through [employer] .   Per test claim: PA required; PA submitted to above mentioned insurance via Fax Key/confirmation #/EOC faxed Status is pending

## 2024-01-05 ENCOUNTER — Other Ambulatory Visit (HOSPITAL_COMMUNITY): Payer: Self-pay

## 2024-01-06 LAB — LIPOPROTEIN A (LPA): Lipoprotein (a): 156.9 nmol/L — ABNORMAL HIGH (ref <75.0)

## 2024-01-07 ENCOUNTER — Other Ambulatory Visit (HOSPITAL_COMMUNITY): Payer: Self-pay

## 2024-01-07 ENCOUNTER — Telehealth: Payer: Self-pay

## 2024-01-07 MED ORDER — REPATHA SURECLICK 140 MG/ML ~~LOC~~ SOAJ: 1.0000 mL | SUBCUTANEOUS | 1 refills | Status: DC

## 2024-01-07 NOTE — Addendum Note (Signed)
 Addended by: Cheree Ditto on: 01/07/2024 02:39 PM   Modules accepted: Orders

## 2024-01-07 NOTE — Telephone Encounter (Signed)
 Spoke to patient LPa results given.He wanted to ask Dr.Crenshaw if he needs to continue taking Plavix.I will send message to him for advice.

## 2024-01-07 NOTE — Telephone Encounter (Signed)
 Pharmacy Patient Advocate Encounter  Received notification from [facility] [employer] that Prior Authorization for repatha has been APPROVED from 12/07/23 to 01/05/25. Ran test claim, Copay is $30.00- one month. This test claim was processed through [facility]- copay amounts may vary at other pharmacies due to pharmacy/plan contracts, or as the patient moves through the different stages of their insurance plan.   PA #/Case ID/Reference #: [id number]

## 2024-01-09 NOTE — Telephone Encounter (Signed)
 Spoke to patient Dr.Crenshaw advised ok to stop Plavix.Continue taking Aspirin 81 mg daily.

## 2024-02-20 DIAGNOSIS — C73 Malignant neoplasm of thyroid gland: Secondary | ICD-10-CM | POA: Diagnosis not present

## 2024-02-20 DIAGNOSIS — E890 Postprocedural hypothyroidism: Secondary | ICD-10-CM | POA: Diagnosis not present

## 2024-02-25 DIAGNOSIS — Z85850 Personal history of malignant neoplasm of thyroid: Secondary | ICD-10-CM | POA: Diagnosis not present

## 2024-02-25 DIAGNOSIS — E890 Postprocedural hypothyroidism: Secondary | ICD-10-CM | POA: Diagnosis not present

## 2024-03-15 ENCOUNTER — Encounter: Payer: Self-pay | Admitting: Pharmacist

## 2024-03-23 ENCOUNTER — Other Ambulatory Visit: Payer: Self-pay

## 2024-03-23 DIAGNOSIS — E7841 Elevated Lipoprotein(a): Secondary | ICD-10-CM

## 2024-03-23 DIAGNOSIS — E7849 Other hyperlipidemia: Secondary | ICD-10-CM | POA: Diagnosis not present

## 2024-03-23 DIAGNOSIS — I2510 Atherosclerotic heart disease of native coronary artery without angina pectoris: Secondary | ICD-10-CM | POA: Diagnosis not present

## 2024-03-23 DIAGNOSIS — Z951 Presence of aortocoronary bypass graft: Secondary | ICD-10-CM | POA: Diagnosis not present

## 2024-03-24 ENCOUNTER — Ambulatory Visit: Payer: Self-pay | Admitting: Cardiology

## 2024-03-24 DIAGNOSIS — E7800 Pure hypercholesterolemia, unspecified: Secondary | ICD-10-CM

## 2024-03-24 DIAGNOSIS — E7841 Elevated Lipoprotein(a): Secondary | ICD-10-CM

## 2024-03-24 LAB — LIPID PANEL
Chol/HDL Ratio: 1.7 ratio (ref 0.0–5.0)
Cholesterol, Total: 87 mg/dL — ABNORMAL LOW (ref 100–199)
HDL: 52 mg/dL (ref >39)
LDL Chol Calc (NIH): 21 mg/dL (ref 0–99)
Triglycerides: 63 mg/dL (ref 0–149)
VLDL Cholesterol Cal: 14 mg/dL (ref 5–40)

## 2024-04-08 MED ORDER — ATORVASTATIN CALCIUM 40 MG PO TABS: 40.0000 mg | ORAL_TABLET | Freq: Every day | ORAL | 3 refills | Status: AC

## 2024-04-08 NOTE — Addendum Note (Signed)
 Addended by: Jaramiah Bossard W on: 04/08/2024 05:19 PM   Modules accepted: Orders

## 2024-06-09 DIAGNOSIS — E7841 Elevated Lipoprotein(a): Secondary | ICD-10-CM | POA: Diagnosis not present

## 2024-06-09 DIAGNOSIS — E7800 Pure hypercholesterolemia, unspecified: Secondary | ICD-10-CM | POA: Diagnosis not present

## 2024-06-10 LAB — LIPID PANEL
Chol/HDL Ratio: 1.9 ratio (ref 0.0–5.0)
Cholesterol, Total: 94 mg/dL — ABNORMAL LOW (ref 100–199)
HDL: 50 mg/dL (ref >39)
LDL Chol Calc (NIH): 30 mg/dL (ref 0–99)
Triglycerides: 65 mg/dL (ref 0–149)
VLDL Cholesterol Cal: 14 mg/dL (ref 5–40)

## 2024-06-10 LAB — HEPATIC FUNCTION PANEL
ALT: 23 IU/L (ref 0–44)
AST: 24 IU/L (ref 0–40)
Albumin: 4.3 g/dL (ref 3.8–4.9)
Alkaline Phosphatase: 73 IU/L (ref 44–121)
Bilirubin Total: 0.4 mg/dL (ref 0.0–1.2)
Bilirubin, Direct: 0.18 mg/dL (ref 0.00–0.40)
Total Protein: 6.5 g/dL (ref 6.0–8.5)

## 2024-06-10 LAB — LIPOPROTEIN A (LPA): Lipoprotein (a): 91.6 nmol/L — ABNORMAL HIGH (ref <75.0)

## 2024-06-12 ENCOUNTER — Other Ambulatory Visit: Payer: Self-pay | Admitting: Cardiology

## 2024-06-12 DIAGNOSIS — E7849 Other hyperlipidemia: Secondary | ICD-10-CM

## 2024-06-12 DIAGNOSIS — E7841 Elevated Lipoprotein(a): Secondary | ICD-10-CM

## 2024-06-12 DIAGNOSIS — I2510 Atherosclerotic heart disease of native coronary artery without angina pectoris: Secondary | ICD-10-CM

## 2024-06-12 DIAGNOSIS — Z951 Presence of aortocoronary bypass graft: Secondary | ICD-10-CM

## 2024-07-27 DIAGNOSIS — Z23 Encounter for immunization: Secondary | ICD-10-CM | POA: Diagnosis not present

## 2024-11-18 ENCOUNTER — Encounter: Payer: Self-pay | Admitting: Cardiology
